# Patient Record
Sex: Female | Born: 1950 | Race: White | Hispanic: No | Marital: Married | Smoking: Never smoker
Health system: Southern US, Community
[De-identification: ages and names within clinical notes are randomized; demographics above are authoritative.]

## PROBLEM LIST (undated history)

## (undated) DIAGNOSIS — I427 Cardiomyopathy due to drug and external agent: Secondary | ICD-10-CM

## (undated) DIAGNOSIS — M359 Systemic involvement of connective tissue, unspecified: Secondary | ICD-10-CM

## (undated) DIAGNOSIS — Z780 Asymptomatic menopausal state: Secondary | ICD-10-CM

## (undated) DIAGNOSIS — K5730 Diverticulosis of large intestine without perforation or abscess without bleeding: Secondary | ICD-10-CM

## (undated) DIAGNOSIS — C50919 Malignant neoplasm of unspecified site of unspecified female breast: Secondary | ICD-10-CM

## (undated) DIAGNOSIS — M069 Rheumatoid arthritis, unspecified: Secondary | ICD-10-CM

## (undated) DIAGNOSIS — I5022 Chronic systolic (congestive) heart failure: Secondary | ICD-10-CM

## (undated) DIAGNOSIS — T451X5A Adverse effect of antineoplastic and immunosuppressive drugs, initial encounter: Secondary | ICD-10-CM

## (undated) HISTORY — DX: Asymptomatic menopausal state: Z78.0

## (undated) HISTORY — DX: Diverticulosis of large intestine without perforation or abscess without bleeding: K57.30

## (undated) HISTORY — PX: ANKLE FUSION: SHX881

## (undated) HISTORY — DX: Rheumatoid arthritis, unspecified: M06.9

## (undated) HISTORY — DX: Malignant neoplasm of unspecified site of unspecified female breast: C50.919

## (undated) HISTORY — DX: Chronic systolic (congestive) heart failure: I50.22

## (undated) HISTORY — DX: Adverse effect of antineoplastic and immunosuppressive drugs, initial encounter: T45.1X5A

## (undated) HISTORY — DX: Adverse effect of antineoplastic and immunosuppressive drugs, initial encounter: I42.7

## (undated) HISTORY — PX: OTHER SURGICAL HISTORY: SHX169

---

## 1995-08-31 DIAGNOSIS — C50919 Malignant neoplasm of unspecified site of unspecified female breast: Secondary | ICD-10-CM

## 1995-08-31 HISTORY — DX: Malignant neoplasm of unspecified site of unspecified female breast: C50.919

## 1995-08-31 HISTORY — PX: MASTECTOMY: SHX3

## 1995-08-31 HISTORY — PX: BREAST BIOPSY: SHX20

## 1995-08-31 HISTORY — PX: BREAST SURGERY: SHX581

## 2002-08-30 HISTORY — PX: FOOT SURGERY: SHX648

## 2004-07-31 ENCOUNTER — Ambulatory Visit: Payer: Self-pay | Admitting: Oncology

## 2004-08-03 ENCOUNTER — Ambulatory Visit: Payer: Self-pay | Admitting: Oncology

## 2004-08-30 ENCOUNTER — Ambulatory Visit: Payer: Self-pay | Admitting: Oncology

## 2005-07-14 ENCOUNTER — Ambulatory Visit: Payer: Self-pay | Admitting: Oncology

## 2005-07-15 ENCOUNTER — Ambulatory Visit: Payer: Self-pay | Admitting: Oncology

## 2005-08-02 ENCOUNTER — Ambulatory Visit: Payer: Self-pay | Admitting: Oncology

## 2005-11-12 ENCOUNTER — Ambulatory Visit: Payer: Self-pay | Admitting: Gastroenterology

## 2006-07-18 ENCOUNTER — Ambulatory Visit: Payer: Self-pay | Admitting: Oncology

## 2006-07-24 ENCOUNTER — Other Ambulatory Visit: Payer: Self-pay

## 2006-07-24 ENCOUNTER — Emergency Department: Payer: Self-pay | Admitting: General Practice

## 2006-07-27 ENCOUNTER — Ambulatory Visit: Payer: Self-pay | Admitting: Oncology

## 2006-07-28 ENCOUNTER — Ambulatory Visit: Payer: Self-pay | Admitting: Oncology

## 2006-08-04 ENCOUNTER — Ambulatory Visit: Payer: Self-pay | Admitting: Oncology

## 2006-08-30 ENCOUNTER — Ambulatory Visit: Payer: Self-pay | Admitting: Oncology

## 2007-01-16 ENCOUNTER — Ambulatory Visit: Payer: Self-pay | Admitting: Oncology

## 2007-07-31 ENCOUNTER — Ambulatory Visit: Payer: Self-pay | Admitting: Oncology

## 2007-08-10 ENCOUNTER — Ambulatory Visit: Payer: Self-pay | Admitting: Oncology

## 2007-08-31 ENCOUNTER — Ambulatory Visit: Payer: Self-pay | Admitting: Oncology

## 2007-10-29 ENCOUNTER — Ambulatory Visit: Payer: Self-pay | Admitting: Oncology

## 2007-11-29 ENCOUNTER — Ambulatory Visit: Payer: Self-pay | Admitting: Oncology

## 2008-01-17 ENCOUNTER — Ambulatory Visit: Payer: Self-pay | Admitting: Oncology

## 2008-01-23 ENCOUNTER — Ambulatory Visit: Payer: Self-pay | Admitting: Oncology

## 2008-07-30 ENCOUNTER — Ambulatory Visit: Payer: Self-pay | Admitting: Oncology

## 2008-08-09 ENCOUNTER — Ambulatory Visit: Payer: Self-pay | Admitting: Oncology

## 2008-08-30 ENCOUNTER — Ambulatory Visit: Payer: Self-pay | Admitting: Oncology

## 2008-08-30 HISTORY — PX: CARDIAC CATHETERIZATION: SHX172

## 2009-01-17 ENCOUNTER — Ambulatory Visit: Payer: Self-pay | Admitting: Oncology

## 2009-03-25 ENCOUNTER — Inpatient Hospital Stay: Payer: Self-pay | Admitting: Specialist

## 2009-07-30 ENCOUNTER — Ambulatory Visit: Payer: Self-pay | Admitting: Oncology

## 2009-08-08 ENCOUNTER — Ambulatory Visit: Payer: Self-pay | Admitting: Oncology

## 2009-08-30 ENCOUNTER — Ambulatory Visit: Payer: Self-pay | Admitting: Oncology

## 2009-08-30 LAB — HM COLONOSCOPY: HM Colonoscopy: NORMAL

## 2009-08-30 LAB — HM PAP SMEAR

## 2009-09-12 ENCOUNTER — Ambulatory Visit: Payer: Self-pay | Admitting: Cardiovascular Disease

## 2009-09-25 ENCOUNTER — Encounter: Payer: Self-pay | Admitting: Cardiovascular Disease

## 2009-11-14 DIAGNOSIS — I428 Other cardiomyopathies: Secondary | ICD-10-CM | POA: Insufficient documentation

## 2009-12-12 ENCOUNTER — Other Ambulatory Visit: Payer: Self-pay | Admitting: Rheumatology

## 2010-01-12 ENCOUNTER — Ambulatory Visit: Payer: Self-pay | Admitting: Cardiovascular Disease

## 2010-01-21 ENCOUNTER — Ambulatory Visit: Payer: Self-pay | Admitting: Internal Medicine

## 2010-02-04 ENCOUNTER — Encounter (INDEPENDENT_AMBULATORY_CARE_PROVIDER_SITE_OTHER): Payer: Self-pay | Admitting: *Deleted

## 2010-02-04 ENCOUNTER — Other Ambulatory Visit: Payer: Self-pay | Admitting: Internal Medicine

## 2010-02-04 LAB — CONVERTED CEMR LAB
Cholesterol: 157 mg/dL
HDL: 63 mg/dL
Hgb A1c MFr Bld: 5.2 %
LDL Cholesterol: 78 mg/dL
Triglyceride fasting, serum: 82 mg/dL

## 2010-02-11 ENCOUNTER — Encounter (INDEPENDENT_AMBULATORY_CARE_PROVIDER_SITE_OTHER): Payer: Self-pay | Admitting: *Deleted

## 2010-04-30 LAB — HM MAMMOGRAPHY: HM Mammogram: NORMAL

## 2010-05-13 ENCOUNTER — Encounter: Payer: Self-pay | Admitting: Cardiovascular Disease

## 2010-05-20 ENCOUNTER — Other Ambulatory Visit: Payer: Self-pay | Admitting: Rheumatology

## 2010-06-19 ENCOUNTER — Encounter: Payer: Self-pay | Admitting: Cardiovascular Disease

## 2010-07-15 ENCOUNTER — Ambulatory Visit: Payer: Self-pay | Admitting: Cardiovascular Disease

## 2010-07-15 DIAGNOSIS — R609 Edema, unspecified: Secondary | ICD-10-CM | POA: Insufficient documentation

## 2010-08-07 LAB — HM PAP SMEAR: HM Pap smear: NORMAL

## 2010-09-17 ENCOUNTER — Other Ambulatory Visit: Payer: Self-pay | Admitting: Rheumatology

## 2010-09-30 ENCOUNTER — Other Ambulatory Visit: Payer: Self-pay | Admitting: Rheumatology

## 2010-09-30 NOTE — Letter (Signed)
Summary: Medical Record Release  Medical Record Release   Imported By: Harlon Flor 11/21/2009 08:38:23  _____________________________________________________________________  External Attachment:    Type:   Image     Comment:   External Document

## 2010-09-30 NOTE — Assessment & Plan Note (Signed)
Summary: F6M/AMD   Visit Type:  Follow-up Primary Provider:  Duncan Dull, MD  CC:  c/o a twinge in chest at times..  History of Present Illness: Helen Parks is a very pleasant 60 her old woman with a history of nonischemic cardiomyopathy, normal coronary arteries by catheterization in 2010, history of rheumatoid arthritis, breast cancer, status post mastectomy and chemotherapy in 1997 with no radiation therapy with ejection fraction noted to be 30% in 2005. MUGA scan in 1997 was normal, mild chronic venous insufficiency. She presents for routine follow up.  Her initial ejection fraction was 25-30% and most recent echocardiogram done shows ejection fraction of 35-40%. She has mild to moderate mitral regurgitation, no pulmonary hypertension.  overall, she reports that she is doing very well. She is not participate in an exercise program. 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 She denies any shortness of breath. She has rare lower extremity edema with a short episode in the summer. Currently with no edema and no cough, lightheadedness or dizziness.  Her recent blood work from October 2011 shows total cholesterol 148, LDL 71, HDL 61 (on no medications), TSH 1.69, creatinine 0.68, hemoglobin A1c 5.2  EKG shows normal sinus rhythm with rate 77 beats per minute, no significant ST or T wave changes. Poor R-wave progression through the precordial leads  Current Medications (verified): 1)  Lisinopril 10 Mg Tabs (Lisinopril) .... Take One Tablet By Mouth Daily 2)  Methotrexate 2.5 Mg Tabs (Methotrexate Sodium) .... Take 8 Tablets On Thursday 3)  Plaquenil 200 Mg Tabs (Hydroxychloroquine Sulfate) .... Take 1 By Mouth Two Times A Day 4)  Sulfasalazine 500 Mg Tabs (Sulfasalazine) .... Take 1 By Mouth Two Times A Day 5)  Coreg 6.25 Mg Tabs (Carvedilol) .... Take 1 By Mouth Two Times A Day 6)  Prednisone 5 Mg Tabs (Prednisone) .... Take 1 By Mouth As Needed 7)  Calcium 150 Mg Tabs (Calcium  Carbonate) .... Take 1 By Mouth Once Daily 8)  Vitamin E 400 Unit Caps (Vitamin E) .... Take 1 By Mouth Once Daily 9)  Folic Acid 1 Mg Tabs (Folic Acid) .... Take 1 By Mouth Once Daily 10)  Quercitin 250 Mg .... Take 1 By Mouth Once Daily  Allergies (verified): 1)  ! Amoxicillin  Past History:  Past Medical History: Last updated: 11/14/2009 Cardiomyopathy Cardiac cath 2010 : normal coronary arteries rheumatology history hx of breast cancer  Past Surgical History: Last updated: 11/14/2009 Mastectomy  Social History: Last updated: 11/14/2009 Full Time Married  Tobacco Use - No.  Alcohol Use - no Drug Use - no  Risk Factors: Smoking Status: never (11/14/2009)  Review of Systems  The patient denies fever, weight loss, weight gain, vision loss, decreased hearing, hoarseness, chest pain, syncope, dyspnea on exertion, peripheral edema, prolonged cough, abdominal pain, incontinence, muscle weakness, depression, and enlarged lymph nodes.    Vital Signs:  Patient profile:   60 year old female Height:      64 inches Weight:      142 pounds BMI:     24.46 Pulse rate:   80 / minute BP sitting:   102 / 58  (left arm) Cuff size:   regular  Vitals Entered By: Bishop Dublin, CMA (July 15, 2010 2:21 PM)  Physical Exam  General:  well-appearing woman in no apparent distress, HEENT exam is benign, neck is supple with no JVP or carotid bruits heart sounds are regular with S1-S2 and no murmurs appreciated, lungs are clear to  auscultation with no wheezes or Rales, abdominal exam is benign, no significant lower extremity edema, neurologic exam is grossly nonfocal skin is warm and dry. Pulses are equal and symmetrical in her upper and lower extremities. Skin:  Intact without lesions or rashes. Psych:  Normal affect.   Impression & Recommendations:  Problem # 1:  CARDIOMYOPATHY, PRIMARY, DILATED (ICD-425.4) Ms. Wisby has been doing well on her current medication regimen with no  signs of decompensating heart failure. Ejection fraction had improved with medical management and she is relatively asymptomatic.  We have not made any medication changes at this time as she is doing well. We have encouraged her to start an exercise program.  Her updated medication list for this problem includes:    Lisinopril 10 Mg Tabs (Lisinopril) .Marland Kitchen... Take one tablet by mouth daily    Coreg 6.25 Mg Tabs (Carvedilol) .Marland Kitchen... Take 1 by mouth two times a day  Orders: EKG w/ Interpretation (93000)  Problem # 2:  EDEMA (ICD-782.3) Her mild edema is likely secondary to chronic venous insufficiency. She reports that her mother also had this. He recommended TED hose and leg elevation. If symptoms get worse, she could talk to vascular/vein surgery about the vein mapping.

## 2010-09-30 NOTE — Miscellaneous (Signed)
Summary: labs - 02/04/10  Clinical Lists Changes  Observations: Added new observation of HGBA1C: 5.2 % (02/04/2010 9:23) Added new observation of TRIGLYCERIDE: 82 mg/dL (98/06/9146 8:29) Added new observation of HDL: 63 mg/dL (56/21/3086 5:78) Added new observation of LDL: 78 mg/dL (46/96/2952 8:41) Added new observation of CHOLESTEROL: 157 mg/dL (32/44/0102 7:25)      -  Date:  02/04/2010    Cholesterol: 157    LDL: 78    HDL: 63    Triglycerides: 82    HgbA1c: 5.2

## 2010-09-30 NOTE — Assessment & Plan Note (Signed)
Summary: NP6/AMD   Primary Provider:  Duncan Dull, MD  CC:  ROV; Edema.  History of Present Illness: [name] is a very pleasant 60 her old woman with a history of nonischemic cardiomyopathy, normal when her arteries by catheterization in 2010, history of rheumatoid arthritis, breast cancer, status post mastectomy and chemotherapy in 1997 with no radiation therapy with ejection fraction noted to be 30% in 2005. MUGA scan in 1997 was normal. She presents to establish care.  Ms. Mirabal states that overall she's been doing well. She has been managed medically by Dr.Arida at [facility]. Her initial ejection fraction was 25-30% and most recent echocardiogram done shows ejection fraction of 35-40%. She has mild to moderate mitral regurgitation, no pulmonary hypertension. Initially she did have fatigue when cord was started though this has improved and she now is relatively asymptomatic. She works at [employer]. She's not do any exercise program.  Problems Prior to Update: 1)  Cardiomyopathy, Primary, Dilated  (ICD-425.4)  Medications Prior to Update: 1)  None  Current Medications (verified): 1)  Lisinopril 10 Mg Tabs (Lisinopril) .... Take One Tablet By Mouth Daily 2)  Methotrexate 2.5 Mg Tabs (Methotrexate Sodium) .... Take 8 Tablets On Thursday 3)  Plaquenil 200 Mg Tabs (Hydroxychloroquine Sulfate) .... Take 1 By Mouth Two Times A Day 4)  Sulfasalazine 500 Mg Tabs (Sulfasalazine) .... Take 1 By Mouth Two Times A Day 5)  Coreg 6.25 Mg Tabs (Carvedilol) .... Take 1 By Mouth Two Times A Day 6)  Prednisone 5 Mg Tabs (Prednisone) .... Take 1 By Mouth As Needed 7)  Calcium 150 Mg Tabs (Calcium Carbonate) .... Take 1 By Mouth Once Daily 8)  Vitamin E 400 Unit Caps (Vitamin E) .... Take 1 By Mouth Once Daily 9)  Folic Acid 1 Mg Tabs (Folic Acid) .... Take 1 By Mouth Once Daily 10)  Quercitin 250 Mg .... Take 1 By Mouth Once Daily  Allergies (verified): 1)  ! Amoxicillin  Past History:  Past  Medical History: Last updated: 11/14/2009 Cardiomyopathy Cardiac cath 2010 : normal coronary arteries rheumatology history hx of breast cancer  Past Surgical History: Last updated: 11/14/2009 Mastectomy  Social History: Last updated: 11/14/2009 Full Time Married  Tobacco Use - No.  Alcohol Use - no Drug Use - no  Risk Factors: Smoking Status: never (11/14/2009)  Review of Systems  The patient denies fever, weight loss, weight gain, vision loss, decreased hearing, hoarseness, chest pain, syncope, dyspnea on exertion, peripheral edema, prolonged cough, abdominal pain, incontinence, muscle weakness, depression, and enlarged lymph nodes.    Vital Signs:  Patient profile:   60 year old female Height:      64 inches Weight:      138.75 pounds BMI:     23.90 Pulse rate:   72 / minute BP sitting:   106 / 64  (left arm) Cuff size:   regular  Vitals Entered By: Stanton Kidney, EMT-P (Jan 12, 2010 3:51 PM)  Physical Exam  General:  well-appearing woman in no apparent distress, HEENT exam is benign, or fax is clear, neck is supple with no JVP or carotid bruits heart sounds are regular with S1-S2 and no murmurs appreciated, lungs are clear to auscultation with no wheezes Rales, abdominal exam is benign, no significant lower extremity edema, neurologic exam is grossly nonfocal skin is warm and dry. Pulses are equal and symmetrical in her upper and lower extremities.    EKG  Procedure date:  01/12/2010  Findings:  normal sinus rhythm with rate 72 beats per minute, no significant ST or T wave changes, poor R wave progression through the precordial leads  Impression & Recommendations:  Problem # 1:  CARDIOMYOPATHY, PRIMARY, DILATED (ICD-425.4) history of nonischemic cardiomyopathy. We'll try to obtain the most recent echocardiogram for our records. Ejection fraction 35-40% which is up from 25-30% 6 years ago. Mild to moderate mitral regurgitation. She is relatively asymptomatic  we've made no medication changes at this time. We'll continue to follow up with her twice here and asked her to contact us if she has worsening edema or shortness of breath.  Her updated medication list for this problem includes:    Lisinopril 10 Mg Tabs (Lisinopril) .Marland Kitchen... Take one tablet by mouth daily    Coreg 6.25 Mg Tabs (Carvedilol) .Marland Kitchen... Take 1 by mouth two times a day  Patient Instructions: 1)  Your physician recommends that you schedule a follow-up appointment in: 6 months Prescriptions: COREG 6.25 MG TABS (CARVEDILOL) Take 1 by mouth two times a day  #180 x 3   Entered by:   Cloyde Reams RN   Authorized by:   Dossie Arbour MD   Signed by:   Cloyde Reams RN on 01/12/2010   Method used:   Print then Give to Patient   RxID:   [id number] LISINOPRIL 10 MG TABS (LISINOPRIL) Take one tablet by mouth daily  #90 x 3   Entered by:   Cloyde Reams RN   Authorized by:   Dossie Arbour MD   Signed by:   Cloyde Reams RN on 01/12/2010   Method used:   Print then Give to Patient   RxID:   [id number]

## 2010-09-30 NOTE — Letter (Signed)
Summary: [facility] Office Visit Note   [facility] Visit Note   Imported By: Roderic Ovens 06/05/2010 13:42:06  _____________________________________________________________________  External Attachment:    Type:   Image     Comment:   External Document

## 2010-11-24 ENCOUNTER — Ambulatory Visit: Payer: Self-pay | Admitting: Rheumatology

## 2010-11-29 DIAGNOSIS — K5730 Diverticulosis of large intestine without perforation or abscess without bleeding: Secondary | ICD-10-CM

## 2010-11-29 HISTORY — DX: Diverticulosis of large intestine without perforation or abscess without bleeding: K57.30

## 2010-12-14 LAB — HM COLONOSCOPY

## 2010-12-16 ENCOUNTER — Other Ambulatory Visit: Payer: Self-pay | Admitting: Rheumatology

## 2010-12-18 ENCOUNTER — Ambulatory Visit: Payer: Self-pay | Admitting: Gastroenterology

## 2010-12-29 ENCOUNTER — Inpatient Hospital Stay: Payer: Self-pay | Admitting: Internal Medicine

## 2011-01-12 LAB — HM COLONOSCOPY

## 2011-01-13 ENCOUNTER — Ambulatory Visit: Payer: Self-pay | Admitting: Internal Medicine

## 2011-01-27 ENCOUNTER — Other Ambulatory Visit: Payer: Self-pay | Admitting: Emergency Medicine

## 2011-01-27 MED ORDER — LISINOPRIL 10 MG PO TABS: 10.0000 mg | ORAL_TABLET | Freq: Every day | ORAL | Status: DC

## 2011-02-01 ENCOUNTER — Ambulatory Visit: Payer: Self-pay | Admitting: Internal Medicine

## 2011-03-14 LAB — HM COLONOSCOPY

## 2011-03-17 ENCOUNTER — Telehealth: Payer: Self-pay

## 2011-03-17 MED ORDER — CARVEDILOL 6.25 MG PO TABS: 6.2500 mg | ORAL_TABLET | Freq: Two times a day (BID) | ORAL | Status: DC

## 2011-03-17 NOTE — Telephone Encounter (Signed)
Needs a refill for Coreg 6.25 mg take one tablet twice a day.

## 2011-03-18 ENCOUNTER — Ambulatory Visit: Payer: Self-pay | Admitting: Internal Medicine

## 2011-03-24 ENCOUNTER — Encounter: Payer: Self-pay | Admitting: Cardiovascular Disease

## 2011-04-28 ENCOUNTER — Other Ambulatory Visit: Payer: Self-pay | Admitting: Internal Medicine

## 2011-04-28 DIAGNOSIS — Z23 Encounter for immunization: Secondary | ICD-10-CM

## 2011-04-28 MED ORDER — ZOSTER VACCINE LIVE 19400 UNT/0.65ML ~~LOC~~ SOLR: 0.6500 mL | Freq: Once | SUBCUTANEOUS | Status: DC

## 2011-05-14 ENCOUNTER — Encounter: Payer: Self-pay | Admitting: Rheumatology

## 2011-05-31 ENCOUNTER — Encounter: Payer: Self-pay | Admitting: Rheumatology

## 2011-07-07 ENCOUNTER — Encounter: Payer: Self-pay | Admitting: Internal Medicine

## 2011-09-14 ENCOUNTER — Encounter: Payer: Self-pay | Admitting: Internal Medicine

## 2011-09-14 ENCOUNTER — Ambulatory Visit (INDEPENDENT_AMBULATORY_CARE_PROVIDER_SITE_OTHER): Payer: PRIVATE HEALTH INSURANCE | Admitting: Internal Medicine

## 2011-09-14 DIAGNOSIS — Z853 Personal history of malignant neoplasm of breast: Secondary | ICD-10-CM | POA: Insufficient documentation

## 2011-09-14 DIAGNOSIS — K5730 Diverticulosis of large intestine without perforation or abscess without bleeding: Secondary | ICD-10-CM | POA: Insufficient documentation

## 2011-09-14 DIAGNOSIS — Z780 Asymptomatic menopausal state: Secondary | ICD-10-CM | POA: Insufficient documentation

## 2011-09-14 DIAGNOSIS — I428 Other cardiomyopathies: Secondary | ICD-10-CM

## 2011-09-14 DIAGNOSIS — M069 Rheumatoid arthritis, unspecified: Secondary | ICD-10-CM | POA: Insufficient documentation

## 2011-09-14 DIAGNOSIS — Z124 Encounter for screening for malignant neoplasm of cervix: Secondary | ICD-10-CM

## 2011-09-14 DIAGNOSIS — Z1211 Encounter for screening for malignant neoplasm of colon: Secondary | ICD-10-CM

## 2011-09-14 MED ORDER — FUROSEMIDE 20 MG PO TABS: 20.0000 mg | ORAL_TABLET | Freq: Every day | ORAL | Status: DC

## 2011-09-14 MED ORDER — POTASSIUM CHLORIDE CRYS ER 20 MEQ PO TBCR: 20.0000 meq | EXTENDED_RELEASE_TABLET | Freq: Every day | ORAL | Status: DC

## 2011-09-14 NOTE — Patient Instructions (Signed)
I have called in fuosemide and potassium chloride to take together when needed for fluid retention.  Please fax me any labs you receive form Dr. Jerline Pain.  We will call with your PAP results

## 2011-09-15 ENCOUNTER — Other Ambulatory Visit (HOSPITAL_COMMUNITY)
Admission: RE | Admit: 2011-09-15 | Discharge: 2011-09-15 | Disposition: A | Discharge status: Home / Self Care | Payer: PRIVATE HEALTH INSURANCE | Source: Ambulatory Visit | Attending: Internal Medicine | Admitting: Internal Medicine

## 2011-09-15 ENCOUNTER — Encounter: Payer: Self-pay | Admitting: Internal Medicine

## 2011-09-15 DIAGNOSIS — Z1159 Encounter for screening for other viral diseases: Secondary | ICD-10-CM | POA: Insufficient documentation

## 2011-09-15 DIAGNOSIS — Z1211 Encounter for screening for malignant neoplasm of colon: Secondary | ICD-10-CM | POA: Insufficient documentation

## 2011-09-15 DIAGNOSIS — Z01419 Encounter for gynecological examination (general) (routine) without abnormal findings: Secondary | ICD-10-CM | POA: Insufficient documentation

## 2011-09-15 DIAGNOSIS — Z124 Encounter for screening for malignant neoplasm of cervix: Secondary | ICD-10-CM | POA: Insufficient documentation

## 2011-09-15 NOTE — Assessment & Plan Note (Signed)
secondary to Enbrel.  Now resolved, normalized EF by repeat ECHO.  Prn furosemide for occasional edema

## 2011-09-15 NOTE — Assessment & Plan Note (Signed)
Colonoscopy was done by [facility] April 2012,  Diverticulosis only.

## 2011-09-15 NOTE — Assessment & Plan Note (Signed)
PAP was normal 2010 and repeated today

## 2011-09-15 NOTE — Progress Notes (Signed)
  Subjective:     Helen Parks is a 61 y.o. female and is here for a comprehensive physical exam. The patient reports no problems.  History   Social History  . Marital Status: Single    Spouse Name: N/A    Number of Children: N/A  . Years of Education: N/A   Occupational History  . dental hygienist     full time   Social History Main Topics  . Smoking status: Former Games developer  . Smokeless tobacco: Not on file   Comment: tobacco use- no  . Alcohol Use: No  . Drug Use: No  . Sexually Active: Not on file   Other Topics Concern  . Not on file   Social History Narrative   Lives with spouse.   Health Maintenance  Topic Date Due  . Tetanus/tdap  03/14/1972  . Influenza Vaccine  05/31/2011  . Mammogram  04/30/2012  . Pap Smear  08/30/2012  . Colonoscopy  08/31/2019    The following portions of the patient's history were reviewed and updated as appropriate: allergies, current medications, past family history, past medical history, past social history, past surgical history and problem list.  Review of Systems A comprehensive review of systems was negative.   Objective:  BP 118/78  Pulse 90  Temp(Src) 98.7 F (37.1 C) (Oral)  Resp 12  Ht 5\' 4"  (1.626 m)  Wt 146 lb (66.225 kg)  BMI 25.06 kg/m2  SpO2 98%  General Appearance:  Alert, cooperative, no distress, appears stated age  Head:  Normocephalic, without obvious abnormality, atraumatic  Eyes:  PERRL, conjunctiva/corneas clear, EOM's intact, fundi benign, both eyes  Ears:  Normal TM's and external ear canals, both ears  Nose: Nares normal, septum midline,mucosa normal, no drainage or sinus tenderness  Throat: Lips, mucosa, and tongue normal; teeth and gums normal  Neck: Supple, symmetrical, trachea midline, no adenopathy;  thyroid: not enlarged, symmetric, no tenderness/mass/nodules; no carotid bruit or JVD  Back:   Symmetric, no curvature, ROM normal, no CVA tenderness  Lungs:   Clear to auscultation bilaterally,  respirations unlabored  Breasts:  Right mastectomy.  No masses or tenderness  Heart:  Regular rate and rhythm, S1 and S2 normal, no murmur, rub, or gallop  Abdomen:   Soft, non-tender, bowel sounds active all four quadrants,  no masses, no organomegaly  Pelvic: Deferred  Extremities: Extremities normal, atraumatic, no cyanosis or edema  Pulses: 2+ and symmetric  Skin: Skin color, texture, turgor normal, no rashes or lesions  Lymph nodes: Cervical, supraclavicular, and axillary nodes normal  Neurologic: Normal         Assessment:    Healthy female exam. Recent labs from Dr. Gavin Potters requested.  Plan:     See After Visit Summary for Counseling Recommendations

## 2011-09-15 NOTE — Progress Notes (Signed)
Addended by: Melody Comas L on: 09/15/2011 10:20 AM   Modules accepted: Orders

## 2011-09-21 ENCOUNTER — Other Ambulatory Visit: Payer: Self-pay | Admitting: *Deleted

## 2011-09-21 MED ORDER — FUROSEMIDE 20 MG PO TABS: 20.0000 mg | ORAL_TABLET | Freq: Every day | ORAL | Status: DC

## 2011-09-21 MED ORDER — POTASSIUM CHLORIDE CRYS ER 20 MEQ PO TBCR: 20.0000 meq | EXTENDED_RELEASE_TABLET | Freq: Every day | ORAL | Status: DC

## 2011-09-21 NOTE — Telephone Encounter (Signed)
Opened in error

## 2011-09-23 ENCOUNTER — Encounter: Payer: Self-pay | Admitting: *Deleted

## 2011-09-24 ENCOUNTER — Other Ambulatory Visit: Payer: Self-pay | Admitting: Gastroenterology

## 2011-09-24 ENCOUNTER — Encounter: Payer: Self-pay | Admitting: Internal Medicine

## 2011-10-05 ENCOUNTER — Encounter: Payer: Self-pay | Admitting: Internal Medicine

## 2011-10-19 ENCOUNTER — Other Ambulatory Visit: Payer: Self-pay | Admitting: Rheumatology

## 2011-10-19 LAB — CREATININE, SERUM
Creatinine: 0.67 mg/dL (ref 0.60–1.30)
EGFR (African American): >60
EGFR (Non-African Amer.): >60

## 2011-10-19 LAB — CBC WITH DIFFERENTIAL/PLATELET
Basophil #: 0 10*3/uL (ref 0.0–0.1)
Basophil %: 0.5 %
Eosinophil #: 0 10*3/uL (ref 0.0–0.7)
Eosinophil %: 0.9 %
HCT: 37.7 % (ref 35.0–47.0)
HGB: 12.6 g/dL (ref 12.0–16.0)
Lymphocyte #: 1.4 10*3/uL (ref 1.0–3.6)
Lymphocyte %: 27.1 %
MCH: 32.9 pg (ref 26.0–34.0)
MCHC: 33.3 g/dL (ref 32.0–36.0)
MCV: 99 fL (ref 80–100)
Monocyte #: 0.4 10*3/uL (ref 0.0–0.7)
Monocyte %: 8.6 %
Neutrophil #: 3.2 10*3/uL (ref 1.4–6.5)
Neutrophil %: 62.9 %
Platelet: 201 10*3/uL (ref 150–440)
RBC: 3.82 10*6/uL (ref 3.80–5.20)
RDW: 13.6 % (ref 11.5–14.5)
WBC: 5.1 10*3/uL (ref 3.6–11.0)

## 2011-10-19 LAB — SGOT (AST)(ARMC): SGOT(AST): 24 U/L (ref 15–37)

## 2011-10-19 LAB — ALT: SGPT (ALT): 27 U/L

## 2011-10-19 LAB — ALBUMIN: Albumin: 3.9 g/dL (ref 3.4–5.0)

## 2011-10-19 LAB — SEDIMENTATION RATE: Erythrocyte Sed Rate: 11 mm/hr (ref 0–30)

## 2011-10-29 ENCOUNTER — Other Ambulatory Visit: Payer: Self-pay | Admitting: Rheumatology

## 2011-11-10 ENCOUNTER — Other Ambulatory Visit: Payer: Self-pay | Admitting: *Deleted

## 2011-11-10 MED ORDER — CARVEDILOL 6.25 MG PO TABS: 6.2500 mg | ORAL_TABLET | Freq: Two times a day (BID) | ORAL | Status: DC

## 2011-11-16 ENCOUNTER — Other Ambulatory Visit: Payer: Self-pay | Admitting: Cardiovascular Disease

## 2011-11-16 NOTE — Telephone Encounter (Signed)
Pharmacy states they have not received anything from [place] about these refills

## 2011-11-17 ENCOUNTER — Other Ambulatory Visit: Payer: Self-pay

## 2011-11-17 NOTE — Telephone Encounter (Signed)
LMOM to have patient call the office to schedule a follow up appointment. The patient is past due.

## 2011-11-18 MED ORDER — CARVEDILOL 6.25 MG PO TABS: 6.2500 mg | ORAL_TABLET | Freq: Two times a day (BID) | ORAL | Status: DC

## 2011-11-18 MED ORDER — LISINOPRIL 10 MG PO TABS: 10.0000 mg | ORAL_TABLET | Freq: Every day | ORAL | Status: DC

## 2011-11-18 NOTE — Telephone Encounter (Signed)
Notified patient need to make an appointment with Dr.Gollan she is past due. The patient made an appointment with Dr. Kirke Corin in April 2013. A refill was sent today for lisinopril and carvedilol.

## 2011-11-18 NOTE — Telephone Encounter (Signed)
Refill sent for lisinopril & carvedilol to [facility].

## 2011-12-03 ENCOUNTER — Ambulatory Visit (INDEPENDENT_AMBULATORY_CARE_PROVIDER_SITE_OTHER): Payer: PRIVATE HEALTH INSURANCE | Admitting: Cardiovascular Disease

## 2011-12-03 ENCOUNTER — Encounter: Payer: Self-pay | Admitting: Cardiovascular Disease

## 2011-12-03 VITALS — BP 107/70 | HR 85 | Ht 64.0 in | Wt 149.0 lb

## 2011-12-03 DIAGNOSIS — I428 Other cardiomyopathies: Secondary | ICD-10-CM

## 2011-12-03 NOTE — Patient Instructions (Addendum)
Your physician has requested that you have an echocardiogram DX CARDIOMYOPATHY, ECHO APPT 12/09/11 @ 12:30 PM BE THERE BY 12 PM @ [facility]. Echocardiography is a painless test that uses sound waves to create images of your heart. It provides your doctor with information about the size and shape of your heart and how well your heart's chambers and valves are working. This procedure takes approximately one hour. There are no restrictions for this procedure.  Your physician wants you to follow-up in: 6 MONTHS WITH DR. Kirke Corin. You will receive a reminder letter in the mail two months in advance. If you don't receive a letter, please call our office to schedule the follow-up appointment.

## 2011-12-03 NOTE — Progress Notes (Signed)
HPI  [name] is a very pleasant 85 old woman with a history of nonischemic cardiomyopathy, normal coronary arteries by catheterization in 2010, history of rheumatoid arthritis, breast cancer, status post mastectomy and chemotherapy in 1997 with no radiation therapy with ejection fraction noted to be 30% in 2005. MUGA scan in 1997 was normal.  She presents for routine follow up. Had cardiomyopathy was felt to be due to previous chemotherapy. She had one episode of heart failure in 2010 which was likely triggered by the use of Enbrel for rheumatoid arthritis. Her initial ejection fraction was 25-30% and most recent echocardiogram done shows ejection fraction of 35-40% but no recent evaluation. She has mild to moderate mitral regurgitation, no pulmonary hypertension.  overall, she reports that she is doing very well. She is not participate in an exercise program. 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 She denies any shortness of breath. Currently with no edema and no cough, lightheadedness or dizziness.   Allergies  Allergen Reactions  . Amoxicillin     REACTION: upset stomach     Current Outpatient Prescriptions on File Prior to Visit  Medication Sig Dispense Refill  . Calcium 150 MG TABS Take by mouth daily.        . carvedilol (COREG) 6.25 MG tablet Take 1 tablet (6.25 mg total) by mouth 2 (two) times daily.  60 tablet  3  . folic acid (FOLVITE) 1 MG tablet Take 1 mg by mouth daily.        . hydroxychloroquine (PLAQUENIL) 200 MG tablet Take by mouth 2 (two) times daily.        Marland Kitchen lisinopril (PRINIVIL,ZESTRIL) 10 MG tablet Take 1 tablet (10 mg total) by mouth daily.  30 tablet  6  . methotrexate (RHEUMATREX) 2.5 MG tablet Take 2.5 mg by mouth once a week. Take 8 tabs on thursday Caution:Chemotherapy. Protect from light.       . potassium chloride SA (K-DUR,KLOR-CON) 20 MEQ tablet Take 1 tablet (20 mEq total) by mouth daily.  30 tablet  3  . Quercetin (QUERCITIN) POWD 250 mg  by Does not apply route daily.        . vitamin E 400 UNIT capsule Take 400 Units by mouth daily.        Marland Kitchen zoster vaccine live, PF, (ZOSTAVAX) 40981 UNT/0.65ML injection Inject 19,400 Units into the skin once.  1 vial  0  . DISCONTD: furosemide (LASIX) 20 MG tablet Take 1 tablet (20 mg total) by mouth daily.  30 tablet  3     Past Medical History  Diagnosis Date  . Cardiomyopathy   . Diverticulosis of colon (without mention of hemorrhage) April 2012    occurred after colonoscopy, [place]  . Menopause     secondary to chemo for BRCA  . Rheumatoid arthritis     on Plaquenil methotrxate [place]avenia Atlas)  . Breast cancer 2010    s/p mastectomy, left breast   . Breast cancer 1997  . Cardiomyopathy due to chemotherapy     precipitated by use of Enbrel  2010     Past Surgical History  Procedure Date  . Rheumatology histroy   . Mastectomy   . Cholecystectomy 1992  . Leep     several years ago, normal paps since  . Foot surgery 1997  . 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 Cardiac catheterization 2010    normal coronary arteries     Family  History  Problem Relation Age of Onset  . Heart disease Father   . Cancer Paternal Aunt     ovarian  . Heart disease Maternal Grandmother      History   Social History  . Marital Status: Single    Spouse Name: N/A    Number of Children: N/A  . Years of Education: N/A   Occupational History  . dental hygienist     full time   Social History Main Topics  . Smoking status: Former Games developer  . Smokeless tobacco: Not on file   Comment: tobacco use- no  . Alcohol Use: No  . Drug Use: No  . Sexually Active: Not on file   Other Topics Concern  . Not on file   Social History Narrative   Lives with spouse.     PHYSICAL EXAM   BP 107/70  Pulse 85  Ht 5\' 4"  (1.626 m)  Wt 149 lb (67.586 kg)  BMI 25.58 kg/m2 Constitutional: She is oriented to person, place, and time. She appears well-developed and  well-nourished. No distress.  HENT: No nasal discharge.  Head: Normocephalic and atraumatic.  Eyes: Pupils are equal and round. Right eye exhibits no discharge. Left eye exhibits no discharge.  Neck: Normal range of motion. Neck supple. No JVD present. No thyromegaly present.  Cardiovascular: Normal rate, regular rhythm, normal heart sounds. Exam reveals no gallop and no friction rub. No murmur heard.  Pulmonary/Chest: Effort normal and breath sounds normal. No stridor. No respiratory distress. She has no wheezes. She has no rales. She exhibits no tenderness.  Abdominal: Soft. Bowel sounds are normal. She exhibits no distension. There is no tenderness. There is no rebound and no guarding.  Musculoskeletal: Normal range of motion. She exhibits no edema and no tenderness.  Neurological: She is alert and oriented to person, place, and time. Coordination normal.  Skin: Skin is warm and dry. No rash noted. She is not diaphoretic. No erythema. No pallor.  Psychiatric: She has a normal mood and affect. Her behavior is normal. Judgment and thought content normal.     EKG: Normal sinus rhythm with no significant ST or T wave changes. Normal QT interval.   ASSESSMENT AND PLAN

## 2011-12-03 NOTE — Assessment & Plan Note (Signed)
Helen Parks seems to be stable from a cardiac standpoint. She has been tolerating heart failure medications. She appears to be in [place] Heart Association class II. She does not appear to be fluid overloaded and rarely has to use furosemide. I recommend continuing current therapy. I recommend a followup echocardiogram to evaluate her LV systolic function. She is to followup in 6 months from now or earlier if needed.

## 2011-12-09 ENCOUNTER — Ambulatory Visit: Payer: Self-pay | Admitting: Cardiovascular Disease

## 2011-12-09 DIAGNOSIS — I059 Rheumatic mitral valve disease, unspecified: Secondary | ICD-10-CM

## 2011-12-21 ENCOUNTER — Telehealth: Payer: Self-pay

## 2011-12-21 DIAGNOSIS — R609 Edema, unspecified: Secondary | ICD-10-CM

## 2011-12-21 DIAGNOSIS — I428 Other cardiomyopathies: Secondary | ICD-10-CM

## 2011-12-21 MED ORDER — SPIRONOLACTONE 25 MG PO TABS: 25.0000 mg | ORAL_TABLET | Freq: Every day | ORAL | Status: DC

## 2011-12-21 NOTE — Telephone Encounter (Signed)
LMOM to have patient call regarding lab work and office visit with Dr. Kirke Corin in two months.

## 2011-12-21 NOTE — Telephone Encounter (Signed)
Message copied by Festus Aloe on Tue Dec 21, 2011 11:22 AM ------      Message from: Lorine Bears A      Created: Tue Dec 21, 2011 10:11 AM      Regarding: Echo results       I discussed echo results with patient at the hospital. Her EF is worse than before.       Start her on Aldactone 25 mg once daily # 30 with 6 refills.       Request BMP in 1 week and in 1 month (she wants done at [facility]).       Schedule her for follow up with me in 2 months.

## 2011-12-22 NOTE — Telephone Encounter (Signed)
Notified patient need to have lab work BMET in one week with a repeat in one month. The patient will also follow up with Dr. Kirke Corin on February 07, 2012.

## 2011-12-28 ENCOUNTER — Other Ambulatory Visit: Payer: Self-pay | Admitting: Cardiovascular Disease

## 2011-12-28 LAB — BASIC METABOLIC PANEL
Anion Gap: 6 — ABNORMAL LOW (ref 7–16)
BUN: 14 mg/dL (ref 7–18)
Calcium, Total: 8.7 mg/dL (ref 8.5–10.1)
Chloride: 103 mmol/L (ref 98–107)
Co2: 30 mmol/L (ref 21–32)
Creatinine: 0.57 mg/dL — ABNORMAL LOW (ref 0.60–1.30)
EGFR (African American): >60
EGFR (Non-African Amer.): >60
Glucose: 91 mg/dL (ref 65–99)
Osmolality: 278 (ref 275–301)
Potassium: 4.2 mmol/L (ref 3.5–5.1)
Sodium: 139 mmol/L (ref 136–145)

## 2012-01-14 ENCOUNTER — Emergency Department: Payer: Self-pay | Admitting: Internal Medicine

## 2012-01-14 LAB — COMPREHENSIVE METABOLIC PANEL
Albumin: 4 g/dL (ref 3.4–5.0)
Alkaline Phosphatase: 59 U/L (ref 50–136)
Anion Gap: 7 (ref 7–16)
BUN: 15 mg/dL (ref 7–18)
Bilirubin,Total: 0.5 mg/dL (ref 0.2–1.0)
Calcium, Total: 8.8 mg/dL (ref 8.5–10.1)
Chloride: 104 mmol/L (ref 98–107)
Co2: 27 mmol/L (ref 21–32)
Creatinine: 0.73 mg/dL (ref 0.60–1.30)
EGFR (African American): >60
EGFR (Non-African Amer.): >60
Glucose: 89 mg/dL (ref 65–99)
Osmolality: 276 (ref 275–301)
Potassium: 4 mmol/L (ref 3.5–5.1)
SGOT(AST): 32 U/L (ref 15–37)
SGPT (ALT): 23 U/L
Sodium: 138 mmol/L (ref 136–145)
Total Protein: 7.5 g/dL (ref 6.4–8.2)

## 2012-01-14 LAB — CK TOTAL AND CKMB (NOT AT ARMC)
CK, Total: 73 U/L (ref 21–215)
CK-MB: <0.5 ng/mL — ABNORMAL LOW (ref 0.5–3.6)

## 2012-01-14 LAB — CBC
HCT: 38 % (ref 35.0–47.0)
HGB: 13.1 g/dL (ref 12.0–16.0)
MCH: 33.3 pg (ref 26.0–34.0)
MCHC: 34.5 g/dL (ref 32.0–36.0)
MCV: 96 fL (ref 80–100)
Platelet: 195 10*3/uL (ref 150–440)
RBC: 3.95 10*6/uL (ref 3.80–5.20)
RDW: 13.5 % (ref 11.5–14.5)
WBC: 4.8 10*3/uL (ref 3.6–11.0)

## 2012-01-14 LAB — PRO B NATRIURETIC PEPTIDE: B-Type Natriuretic Peptide: 620 pg/mL — ABNORMAL HIGH (ref 0–125)

## 2012-01-14 LAB — TROPONIN I: Troponin-I: <0.02 ng/mL

## 2012-01-18 ENCOUNTER — Encounter: Payer: Self-pay | Admitting: Cardiovascular Disease

## 2012-01-18 ENCOUNTER — Ambulatory Visit (INDEPENDENT_AMBULATORY_CARE_PROVIDER_SITE_OTHER): Payer: PRIVATE HEALTH INSURANCE | Admitting: Cardiovascular Disease

## 2012-01-18 VITALS — BP 110/64 | HR 80 | Ht 64.0 in | Wt 146.0 lb

## 2012-01-18 DIAGNOSIS — I428 Other cardiomyopathies: Secondary | ICD-10-CM

## 2012-01-18 DIAGNOSIS — R0602 Shortness of breath: Secondary | ICD-10-CM

## 2012-01-18 DIAGNOSIS — I5022 Chronic systolic (congestive) heart failure: Secondary | ICD-10-CM | POA: Insufficient documentation

## 2012-01-18 DIAGNOSIS — I509 Heart failure, unspecified: Secondary | ICD-10-CM

## 2012-01-18 MED ORDER — SPIRONOLACTONE 25 MG PO TABS: 25.0000 mg | ORAL_TABLET | Freq: Every day | ORAL | Status: DC

## 2012-01-18 NOTE — Progress Notes (Signed)
HPI  Helen Parks is a very pleasant 31 old woman with a history of nonischemic cardiomyopathy, normal coronary arteries by catheterization in 2010, history of rheumatoid arthritis, breast cancer, status post mastectomy and chemotherapy in 1997 with no radiation therapy with ejection fraction noted to be 30% in 2005. MUGA scan in 1997 was normal. She presents for routine follow up. Her cardiomyopathy was felt to be due to previous chemotherapy. She had an episode of heart failure in 2010 which was likely triggered by the use of Enbrel for rheumatoid arthritis.  Her initial ejection fraction was 25-30% which improved to 35-40% in 2011after starting Coreg and Lisinopril. I saw her in April for a followup visit. An echocardiogram was performed which showed significant worsening in her left ventricular function. The left ventricle was more dilated with an ejection fraction of 25%. I started her on spironolactone 25 mg once daily. She presented last week to [facility] ER with significant worsening of dyspnea. Her labs were unremarkable except for a BNP of 600 and a slightly elevated d-dimer level. She had CT scan done which showed no evidence of pulmonary embolism but there was mild pulmonary vascular congestion. Her dose of Lasix was increased to 40 mg once daily. She felt better after that but she continues to feel not back to her baseline. She denies orthopnea or PND.  Allergies  Allergen Reactions  . Amoxicillin     REACTION: upset stomach     Current Outpatient Prescriptions on File Prior to Visit  Medication Sig Dispense Refill  . Calcium 150 MG TABS Take by mouth daily.        . carvedilol (COREG) 6.25 MG tablet Take 1 tablet (6.25 mg total) by mouth 2 (two) times daily.  60 tablet  3  . folic acid (FOLVITE) 1 MG tablet Take 1 mg by mouth daily.        Marland Kitchen lisinopril (PRINIVIL,ZESTRIL) 10 MG tablet Take 1 tablet (10 mg total) by mouth daily.  30 tablet  6  . methotrexate (RHEUMATREX) 2.5 MG tablet  Take 2.5 mg by mouth once a week. Take 10 tabs on thursday Caution:Chemotherapy. Protect from light.      . Quercetin (QUERCITIN) POWD 250 mg by Does not apply route daily.        . vitamin E 400 UNIT capsule Take 400 Units by mouth daily.        Marland Kitchen DISCONTD: spironolactone (ALDACTONE) 25 MG tablet Take 1 tablet (25 mg total) by mouth daily.  30 tablet  6     Past Medical History  Diagnosis Date  . Cardiomyopathy   . Diverticulosis of colon (without mention of hemorrhage) April 2012    occurred after colonoscopy, [place]  . Menopause     secondary to chemo for BRCA  . Rheumatoid arthritis     on Plaquenil methotrxate Lavenia Atlas)  . Breast cancer 2010    s/p mastectomy, left breast   . Breast cancer 1997  . Cardiomyopathy due to chemotherapy     precipitated by use of Enbrel  2010     Past Surgical History  Procedure Date  . Rheumatology histroy   . Mastectomy   . Cholecystectomy 1992  . Leep     several years ago, normal paps since  . Foot surgery 1997  . 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 Cardiac catheterization 2010    normal coronary arteries     Family History  Problem  Relation Age of Onset  . Heart disease Father   . Cancer Paternal Aunt     ovarian  . Heart disease Maternal Grandmother      History   Social History  . Marital Status: Single    Spouse Name: N/A    Number of Children: N/A  . Years of Education: N/A   Occupational History  . dental hygienist     full time   Social History Main Topics  . Smoking status: Former Games developer  . Smokeless tobacco: Not on file   Comment: tobacco use- no  . Alcohol Use: No  . Drug Use: No  . Sexually Active: Not on file   Other Topics Concern  . Not on file   Social History Narrative   Lives with spouse.      PHYSICAL EXAM   BP 110/64  Pulse 80  Ht 5\' 4"  (1.626 m)  Wt 146 lb (66.225 kg)  BMI 25.06 kg/m2 Constitutional: She is oriented to person, place, and time.  She appears well-developed and well-nourished. No distress.  HENT: No nasal discharge.  Head: Normocephalic and atraumatic.  Eyes: Pupils are equal and round. Right eye exhibits no discharge. Left eye exhibits no discharge.  Neck: Normal range of motion. Neck supple. No JVD present. No thyromegaly present.  Cardiovascular: Normal rate, regular rhythm, normal heart sounds. Exam reveals no gallop and no friction rub. No murmur heard.  Pulmonary/Chest: Effort normal and breath sounds normal. No stridor. No respiratory distress. She has no wheezes. She has no rales. She exhibits no tenderness.  Abdominal: Soft. Bowel sounds are normal. She exhibits no distension. There is no tenderness. There is no rebound and no guarding.  Musculoskeletal: Normal range of motion. She exhibits no edema and no tenderness.  Neurological: She is alert and oriented to person, place, and time. Coordination normal.  Skin: Skin is warm and dry. No rash noted. She is not diaphoretic. No erythema. No pallor.  Psychiatric: She has a normal mood and affect. Her behavior is normal. Judgment and thought content normal.     EKG: Sinus  Rhythm -Old anteroseptal infarct.   Low voltage -possible pulmonary disease.      ASSESSMENT AND PLAN

## 2012-01-18 NOTE — Patient Instructions (Addendum)
Labs today.  Cardiac MRI at [facility].  Consult with Dr. Graciela Husbands for ICD   Return to work next week on Tuesday.  Follow up in 2 months.

## 2012-01-18 NOTE — Assessment & Plan Note (Signed)
Helen Parks had a significant deterioration in her cardiac status recently. Her ejection fraction has decreased to 25% and clinically she has progressed from class II to class III [place] heart association. She also showed evidence of significant fluid overload requiring increased use of diuretics. The etiology for worsening cardiac function after improvement in 2011 is still not clear but there are a few things to consider. She does have prolonged history of rheumatoid arthritis and will have to evaluate the possibility of cardiac amyloidosis which is not a high possibility. The other possibility is hydroxychloroquine associated cardiomyopathy which is overall rate. This was the only new medication that was initiated in 2011. We have stopped this medication after the recent echocardiogram findings. I recommend proceeding with a cardiac MRI for further evaluation. I will check basic metabolic profile and BNP today due to her persistent dyspnea and recent increase in the dose of Lasix. I will schedule her to see Dr. Graciela Husbands for evaluation of an ICD placement. Her QRS duration is not prolonged enough to qualify for a CRT.

## 2012-01-19 ENCOUNTER — Telehealth: Payer: Self-pay

## 2012-01-19 LAB — BASIC METABOLIC PANEL
BUN/Creatinine Ratio: 22 (ref 9–23)
BUN: 14 mg/dL (ref 6–24)
CO2: 24 mmol/L (ref 20–32)
Calcium: 9.5 mg/dL (ref 8.7–10.2)
Chloride: 99 mmol/L (ref 97–108)
Creatinine, Ser: 0.64 mg/dL (ref 0.57–1.00)
GFR calc Af Amer: 114 mL/min/{1.73_m2} (ref >59)
GFR calc non Af Amer: 99 mL/min/{1.73_m2} (ref >59)
Glucose: 96 mg/dL (ref 65–99)
Potassium: 4.4 mmol/L (ref 3.5–5.2)
Sodium: 138 mmol/L (ref 134–144)

## 2012-01-19 LAB — BRAIN NATRIURETIC PEPTIDE: BNP: 91 pg/mL (ref 0.0–100.0)

## 2012-01-19 NOTE — Telephone Encounter (Signed)
Message copied by Marcelle Overlie on Wed Jan 19, 2012 11:10 AM ------      Message from: Lorne Skeens C      Created: Wed Jan 19, 2012 11:03 AM      Regarding: FW: cardiac MRI re do       Kamali Sakata, correct, thank you, Dr. Kirke Corin has to sign off before MRI can proceed. Send him an Chief Technology Officer. Thanks gesila                   ----- Message -----         From: Marcelle Overlie, RN         Sent: 01/19/2012  10:14 AM           To: Sherrilyn Rist      Subject: cardiac MRI re do                                        See if I did it right this time :)      Thanks!

## 2012-01-19 NOTE — Progress Notes (Signed)
Addended by: Sabino Snipes E on: 01/19/2012 10:13 AM   Modules accepted: Orders

## 2012-01-19 NOTE — Telephone Encounter (Signed)
I signed the order. I put the order myself yesterday. What was wrong with that?

## 2012-01-19 NOTE — Telephone Encounter (Signed)
Not sure. She just asked me to redo it. Sorry :(

## 2012-01-19 NOTE — Telephone Encounter (Signed)
Dr. Kirke Corin, please sign off on cardiac MRI order so this can be scheduled. Thanks!

## 2012-01-20 ENCOUNTER — Telehealth: Payer: Self-pay | Admitting: *Deleted

## 2012-01-20 NOTE — Telephone Encounter (Signed)
Pt notified that lab work was normal--continue with present regime--pt agrees

## 2012-01-25 ENCOUNTER — Inpatient Hospital Stay (HOSPITAL_COMMUNITY): Admission: RE | Admit: 2012-01-25 | Payer: PRIVATE HEALTH INSURANCE | Source: Ambulatory Visit

## 2012-01-27 ENCOUNTER — Other Ambulatory Visit: Payer: Self-pay

## 2012-01-27 MED ORDER — FUROSEMIDE 40 MG PO TABS: 40.0000 mg | ORAL_TABLET | Freq: Every day | ORAL | Status: DC

## 2012-01-27 NOTE — Telephone Encounter (Signed)
Refill sent for lasix 40 mg take one tablet daily. 

## 2012-01-28 ENCOUNTER — Ambulatory Visit (HOSPITAL_COMMUNITY)
Admission: RE | Admit: 2012-01-28 | Discharge: 2012-01-28 | Disposition: A | Discharge status: Home / Self Care | Payer: PRIVATE HEALTH INSURANCE | Source: Ambulatory Visit | Attending: Cardiovascular Disease | Admitting: Cardiovascular Disease

## 2012-01-28 DIAGNOSIS — R0602 Shortness of breath: Secondary | ICD-10-CM

## 2012-01-28 DIAGNOSIS — I428 Other cardiomyopathies: Secondary | ICD-10-CM | POA: Insufficient documentation

## 2012-01-28 MED ORDER — GADOBENATE DIMEGLUMINE 529 MG/ML IV SOLN
25.0000 mL | Freq: Once | INTRAVENOUS | Status: AC | PRN
  Administered 2012-01-28: 25 mL via INTRAVENOUS

## 2012-01-31 ENCOUNTER — Telehealth: Payer: Self-pay

## 2012-01-31 ENCOUNTER — Other Ambulatory Visit: Payer: Self-pay

## 2012-01-31 DIAGNOSIS — I428 Other cardiomyopathies: Secondary | ICD-10-CM

## 2012-01-31 MED ORDER — FUROSEMIDE 40 MG PO TABS: 40.0000 mg | ORAL_TABLET | Freq: Every day | ORAL | Status: DC

## 2012-01-31 NOTE — Telephone Encounter (Signed)
LMOM re: disability papers ready to be picked up. Also faxing originals

## 2012-02-01 NOTE — Telephone Encounter (Signed)
Pt r/c. Will pick up original disability form at [facility]

## 2012-02-07 ENCOUNTER — Ambulatory Visit: Payer: PRIVATE HEALTH INSURANCE | Admitting: Cardiovascular Disease

## 2012-02-10 ENCOUNTER — Telehealth: Payer: Self-pay | Admitting: Cardiovascular Disease

## 2012-02-22 ENCOUNTER — Ambulatory Visit (INDEPENDENT_AMBULATORY_CARE_PROVIDER_SITE_OTHER): Payer: PRIVATE HEALTH INSURANCE | Admitting: Internal Medicine

## 2012-02-22 ENCOUNTER — Encounter: Payer: Self-pay | Admitting: Internal Medicine

## 2012-02-22 VITALS — BP 110/60 | HR 84 | Ht 64.0 in | Wt 146.5 lb

## 2012-02-22 DIAGNOSIS — I509 Heart failure, unspecified: Secondary | ICD-10-CM

## 2012-02-22 DIAGNOSIS — I428 Other cardiomyopathies: Secondary | ICD-10-CM

## 2012-02-22 DIAGNOSIS — I5022 Chronic systolic (congestive) heart failure: Secondary | ICD-10-CM

## 2012-02-22 NOTE — Assessment & Plan Note (Signed)
stable °

## 2012-02-22 NOTE — Progress Notes (Signed)
  HPI  Helen Parks is a 61 y.o. female Referred for  ICD but EF is 37% so not a candidate  Past Medical History  Diagnosis Date  . Cardiomyopathy   . Diverticulosis of colon (without mention of hemorrhage) April 2012    occurred after colonoscopy, [place]  . Menopause     secondary to chemo for BRCA  . Rheumatoid arthritis     on Plaquenil methotrxate Lavenia Atlas)  . Breast cancer 2010    s/p mastectomy, left breast   . Breast cancer 1997  . Cardiomyopathy due to chemotherapy     precipitated by use of Enbrel  2010  . Chronic systolic congestive heart failure     Past Surgical History  Procedure Date  . Rheumatology histroy   . Mastectomy   . Cholecystectomy 1992  . Leep     several years ago, normal paps since  . Foot surgery 1997  . 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 Cardiac catheterization 2010    normal coronary arteries    Current Outpatient Prescriptions  Medication Sig Dispense Refill  . Calcium 150 MG TABS Take by mouth daily.        . carvedilol (COREG) 6.25 MG tablet Take 1 tablet (6.25 mg total) by mouth 2 (two) times daily.  60 tablet  3  . folic acid (FOLVITE) 1 MG tablet Take 1 mg by mouth daily.        . furosemide (LASIX) 40 MG tablet Take 1 tablet (40 mg total) by mouth daily.  30 tablet  6  . lisinopril (PRINIVIL,ZESTRIL) 10 MG tablet Take 1 tablet (10 mg total) by mouth daily.  30 tablet  6  . methotrexate (RHEUMATREX) 2.5 MG tablet Take 2.5 mg by mouth once a week. Take 10 tabs on thursday Caution:Chemotherapy. Protect from light.      . Quercetin (QUERCITIN) POWD 250 mg by Does not apply route daily.        Marland Kitchen spironolactone (ALDACTONE) 25 MG tablet Take 1 tablet (25 mg total) by mouth daily.  90 tablet  3  . vitamin E 400 UNIT capsule Take 400 Units by mouth daily.          Allergies  Allergen Reactions  . Amoxicillin     REACTION: upset stomach    Review of Systems negative except from HPI and PMH  Physical  Exam BP 110/60  Pulse 84  Ht 5\' 4"  (1.626 m)  Wt 146 lb 8 oz (66.452 kg)  BMI 25.15 kg/m2      Assessment and  Plan

## 2012-02-22 NOTE — Assessment & Plan Note (Signed)
Reviewed physiology of CHF

## 2012-03-13 ENCOUNTER — Encounter: Payer: Self-pay | Admitting: Internal Medicine

## 2012-03-13 ENCOUNTER — Ambulatory Visit (INDEPENDENT_AMBULATORY_CARE_PROVIDER_SITE_OTHER): Payer: PRIVATE HEALTH INSURANCE | Admitting: Internal Medicine

## 2012-03-13 VITALS — BP 118/67 | HR 73 | Temp 98.6°F | Wt 148.0 lb

## 2012-03-13 DIAGNOSIS — Z1239 Encounter for other screening for malignant neoplasm of breast: Secondary | ICD-10-CM

## 2012-03-13 DIAGNOSIS — Z124 Encounter for screening for malignant neoplasm of cervix: Secondary | ICD-10-CM

## 2012-03-13 DIAGNOSIS — I428 Other cardiomyopathies: Secondary | ICD-10-CM

## 2012-03-13 MED ORDER — AZITHROMYCIN 250 MG PO TABS: ORAL_TABLET | ORAL | Status: AC

## 2012-03-13 MED ORDER — TAMSULOSIN HCL 0.4 MG PO CAPS: 0.4000 mg | ORAL_CAPSULE | Freq: Every day | ORAL | Status: DC

## 2012-03-13 MED ORDER — FUROSEMIDE 20 MG PO TABS: 20.0000 mg | ORAL_TABLET | Freq: Two times a day (BID) | ORAL | Status: DC

## 2012-03-14 ENCOUNTER — Encounter: Payer: Self-pay | Admitting: Internal Medicine

## 2012-03-14 NOTE — Progress Notes (Signed)
Patient ID: Helen Parks, female   DOB: 05/14/1951, 61 y.o.   MRN: [id number]  Patient Active Problem List  Diagnosis  . CARDIOMYOPATHY, PRIMARY, DILATED  . EDEMA  . Diverticulosis of colon (without mention of hemorrhage)  . Menopause  . Rheumatoid arthritis  . Breast cancer  . Screening for cervical cancer  . Screening for colon cancer  . Chronic systolic congestive heart failure    Subjective:  CC:   Chief Complaint  Patient presents with  . Follow-up    HPI: 6 month follow up on chronic medical issues.  Has had deterioration in EF by recent repeat ECHO  But not enough for ICD, by recent EP study EF of 37%.  Attributed to Plaquenil so this was stopped.  She has had medication changes, but feels that her furosemide dosing of 40 mg daily serves her better if she divides it into two daily doses,  No bothersome nocturia.  Has begun exercising,  Worked her way up from 3 minutes on the Elliptical to 15  minutes daily in the morning without chest pain or severe dyspnea,.  Energy level improving.   Going to [place]) to see her son who is home from [place] this week.    Geraldo Pitter a 61 y.o. female who presents   Past Medical History  Diagnosis Date  . Cardiomyopathy   . Diverticulosis of colon (without mention of hemorrhage) April 2012    occurred after colonoscopy, [place]  . Menopause     secondary to chemo for BRCA  . Rheumatoid arthritis     on Plaquenil methotrxate Lavenia Atlas)  . Breast cancer 2010    s/p mastectomy, left breast   . Breast cancer 1997  . Cardiomyopathy due to chemotherapy     precipitated by use of Enbrel  2010  . Chronic systolic congestive heart failure     Past Surgical History  Procedure Date  . Rheumatology histroy   . Mastectomy 1997  . Foot surgery 2004  . 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 Cardiac catheterization 2010    normal coronary arteries  . Left paraovarian cystectomy 2004    The  following portions of the patient's history were reviewed and updated as appropriate: Allergies, current medications, and problem list.    Review of Systems:   12 Pt  review of systems was negative except those addressed in the HPI,     History   Social History  . Marital Status: Single    Spouse Name: N/A    Number of Children: N/A  . Years of Education: N/A   Occupational History  . dental hygienist     full time   Social History Main Topics  . Smoking status: Former Games developer  . Smokeless tobacco: Not on file   Comment: tobacco use- no  . Alcohol Use: No  . Drug Use: No  . Sexually Active: Not on file   Other Topics Concern  . Not on file   Social History Narrative   Lives with spouse.    Objective:  BP 118/67  Pulse 73  Temp 98.6 F (37 C) (Oral)  Wt 148 lb (67.132 kg)  SpO2 96%  General appearance: alert, cooperative and appears stated age Ears: normal TM's and external ear canals both ears Throat: lips, mucosa, and tongue normal; teeth and gums normal Neck: no adenopathy, no carotid bruit, supple, symmetrical, trachea midline and thyroid not enlarged, symmetric, no tenderness/mass/nodules  Back: symmetric, no curvature. ROM normal. No CVA tenderness. Lungs: clear to auscultation bilaterally Heart: regular rate and rhythm, S1, S2 normal, no murmur, click, rub or gallop Abdomen: soft, non-tender; bowel sounds normal; no masses,  no organomegaly Pulses: 2+ and symmetric Skin: Skin color, texture, turgor normal. No rashes or lesions Lymph nodes: Cervical, supraclavicular, and axillary nodes normal.  Assessment and Plan:  CARDIOMYOPATHY, PRIMARY, DILATED Currently compensated with use of lasix and spironolactone she is exercising daily with good tolerance.  Screening for cervical cancer Done in Dec 2011.   Updated Medication List Outpatient Encounter Prescriptions as of 03/13/2012  Medication Sig Dispense Refill  . Calcium 150 MG TABS Take by mouth  daily.        . carvedilol (COREG) 6.25 MG tablet Take 1 tablet (6.25 mg total) by mouth 2 (two) times daily.  60 tablet  3  . folic acid (FOLVITE) 1 MG tablet Take 1 mg by mouth daily.        . furosemide (LASIX) 20 MG tablet Take 1 tablet (20 mg total) by mouth 2 (two) times daily.  180 tablet  3  . lisinopril (PRINIVIL,ZESTRIL) 10 MG tablet Take 1 tablet (10 mg total) by mouth daily.  30 tablet  6  . methotrexate (RHEUMATREX) 2.5 MG tablet Take 2.5 mg by mouth once a week. Take 10 tabs on thursday Caution:Chemotherapy. Protect from light.      . Quercetin (QUERCITIN) POWD 250 mg by Does not apply route daily.        Marland Kitchen spironolactone (ALDACTONE) 25 MG tablet Take 1 tablet (25 mg total) by mouth daily.  90 tablet  3  . vitamin E 400 UNIT capsule Take 400 Units by mouth daily.        Marland Kitchen DISCONTD: furosemide (LASIX) 40 MG tablet Take 1 tablet (40 mg total) by mouth daily.  30 tablet  6  . azithromycin (ZITHROMAX) 250 MG tablet 2 tablets on day 1, then 1 tablet daily for 4 days  6 each  0  . Tamsulosin HCl (FLOMAX) 0.4 MG CAPS Take 1 capsule (0.4 mg total) by mouth daily.  30 capsule  3     Orders Placed This Encounter  Procedures  . MM Digital Screening  . COMPLETE METABOLIC PANEL WITH GFR  . HM PAP SMEAR  . HM COLONOSCOPY  . HM COLONOSCOPY    No Follow-up on file.

## 2012-03-14 NOTE — Assessment & Plan Note (Signed)
Currently compensated with use of lasix and spironolactone she is exercising daily with good tolerance.

## 2012-03-14 NOTE — Assessment & Plan Note (Signed)
Done in Dec 2011.

## 2012-03-20 ENCOUNTER — Encounter: Payer: Self-pay | Admitting: Cardiovascular Disease

## 2012-03-20 ENCOUNTER — Ambulatory Visit (INDEPENDENT_AMBULATORY_CARE_PROVIDER_SITE_OTHER): Payer: PRIVATE HEALTH INSURANCE | Admitting: Cardiovascular Disease

## 2012-03-20 VITALS — BP 100/70 | HR 84 | Ht 64.0 in | Wt 149.2 lb

## 2012-03-20 DIAGNOSIS — I429 Cardiomyopathy, unspecified: Secondary | ICD-10-CM

## 2012-03-20 DIAGNOSIS — I509 Heart failure, unspecified: Secondary | ICD-10-CM

## 2012-03-20 DIAGNOSIS — I428 Other cardiomyopathies: Secondary | ICD-10-CM

## 2012-03-20 DIAGNOSIS — I5022 Chronic systolic (congestive) heart failure: Secondary | ICD-10-CM

## 2012-03-20 NOTE — Assessment & Plan Note (Signed)
She is back in [place] Heart Association class II after recent worsening. She appears to be euvolemic. Ejection fraction on cardiac MRI improved to 37%. The left ventricle is still significantly dilated. She is currently on good medical therapy. I am unable to increase her beta blocker or ACE inhibitor at this time due to borderline low blood pressure. She is due to have labs performed soon to check renal function and electrolytes. Followup in 6 months.

## 2012-03-20 NOTE — Progress Notes (Signed)
HPI  Helen Parks is a pleasant 49 old woman with a history of nonischemic cardiomyopathy, normal coronary arteries by catheterization in 2010, history of rheumatoid arthritis, breast cancer, status post mastectomy and chemotherapy in 1997 with no radiation therapy with ejection fraction noted to be 30% in 2005. MUGA scan in 1997 was normal. She presents for routine follow up. Her cardiomyopathy was felt to be due to previous chemotherapy. She had an episode of heart failure in 2010 which was likely triggered by the use of Enbrel for rheumatoid arthritis.  Her initial ejection fraction was 25-30% which improved to 35-40% in 2011after starting Coreg and Lisinopril. I saw her in April for a followup visit. An echocardiogram was performed which showed significant worsening in her left ventricular function. The left ventricle was more dilated with an ejection fraction of 25%. I started her on spironolactone 25 mg once daily. Hydroxychloroquine was discontinued. She had an emergency room visit for worsening dyspnea and elevated BNP. She was started on furosemide at that time and has been on. She had gradual improvement in her symptoms. A cardiac MRI was performed which showed moderately dilated left ventricle with an ejection fraction of 37%. She is currently feeling much better. Her dyspnea has improved significantly. She started an exercise program. She denies orthopnea or PND. She recently flew to [place] state to visit her son who just came back from [place].    Allergies  Allergen Reactions  . Amoxicillin     REACTION: upset stomach     Current Outpatient Prescriptions on File Prior to Visit  Medication Sig Dispense Refill  . Calcium 150 MG TABS Take by mouth daily.        . carvedilol (COREG) 6.25 MG tablet Take 1 tablet (6.25 mg total) by mouth 2 (two) times daily.  60 tablet  3  . folic acid (FOLVITE) 1 MG tablet Take 1 mg by mouth daily.        . furosemide (LASIX) 20 MG tablet Take  1 tablet (20 mg total) by mouth 2 (two) times daily.  180 tablet  3  . lisinopril (PRINIVIL,ZESTRIL) 10 MG tablet Take 1 tablet (10 mg total) by mouth daily.  30 tablet  6  . methotrexate (RHEUMATREX) 2.5 MG tablet Take 2.5 mg by mouth once a week. Take 10 tabs on thursday Caution:Chemotherapy. Protect from light.      . Quercetin (QUERCITIN) POWD 250 mg by Does not apply route daily.        Marland Kitchen spironolactone (ALDACTONE) 25 MG tablet Take 1 tablet (25 mg total) by mouth daily.  90 tablet  3  . Tamsulosin HCl (FLOMAX) 0.4 MG CAPS Take 1 capsule (0.4 mg total) by mouth daily.  30 capsule  3  . vitamin E 400 UNIT capsule Take 400 Units by mouth daily.           Past Medical History  Diagnosis Date  . Cardiomyopathy   . Diverticulosis of colon (without mention of hemorrhage) April 2012    occurred after colonoscopy, [place]  . Menopause     secondary to chemo for BRCA  . Rheumatoid arthritis     on Plaquenil methotrxate Lavenia Atlas)  . Breast cancer 2010    s/p mastectomy, left breast   . Breast cancer 1997  . Cardiomyopathy due to chemotherapy     precipitated by use of Enbrel  2010  . Chronic systolic congestive heart failure      Past Surgical History  Procedure  Date  . Rheumatology histroy   . Mastectomy 1997  . Foot surgery 2004  . 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 Cardiac catheterization 2010    normal coronary arteries  . Left paraovarian cystectomy 2004     Family History  Problem Relation Age of Onset  . Heart disease Father   . Cancer Paternal Aunt     ovarian  . Heart disease Maternal Grandmother      History   Social History  . Marital Status: Single    Spouse Name: N/A    Number of Children: N/A  . Years of Education: N/A   Occupational History  . dental hygienist     full time   Social History Main Topics  . Smoking status: Former Games developer  . Smokeless tobacco: Not on file   Comment: tobacco use- no  . Alcohol Use:  No  . Drug Use: No  . Sexually Active: Not on file   Other Topics Concern  . Not on file   Social History Narrative   Lives with spouse.     PHYSICAL EXAM   BP 100/70  Pulse 84  Ht 5\' 4"  (1.626 m)  Wt 149 lb 4 oz (67.699 kg)  BMI 25.62 kg/m2  Constitutional: She is oriented to person, place, and time. She appears well-developed and well-nourished. No distress.  HENT: No nasal discharge.  Head: Normocephalic and atraumatic.  Eyes: Pupils are equal and round. Right eye exhibits no discharge. Left eye exhibits no discharge.  Neck: Normal range of motion. Neck supple. No JVD present. No thyromegaly present.  Cardiovascular: Normal rate, regular rhythm, normal heart sounds. Exam reveals no gallop and no friction rub. No murmur heard.  Pulmonary/Chest: Effort normal and breath sounds normal. No stridor. No respiratory distress. She has no wheezes. She has no rales. She exhibits no tenderness.  Abdominal: Soft. Bowel sounds are normal. She exhibits no distension. There is no tenderness. There is no rebound and no guarding.  Musculoskeletal: Normal range of motion. She exhibits no edema and no tenderness.  Neurological: She is alert and oriented to person, place, and time. Coordination normal.  Skin: Skin is warm and dry. No rash noted. She is not diaphoretic. No erythema. No pallor.  Psychiatric: She has a normal mood and affect. Her behavior is normal. Judgment and thought content normal.   EKG: Normal sinus rhythm, low voltage and prolonged QT interval.   ASSESSMENT AND PLAN

## 2012-03-20 NOTE — Patient Instructions (Addendum)
Continue same medications.  Follow up in 6 months.  

## 2012-03-28 ENCOUNTER — Other Ambulatory Visit: Payer: Self-pay

## 2012-03-28 ENCOUNTER — Telehealth: Payer: Self-pay | Admitting: Cardiovascular Disease

## 2012-03-28 DIAGNOSIS — I429 Cardiomyopathy, unspecified: Secondary | ICD-10-CM

## 2012-03-28 NOTE — Telephone Encounter (Signed)
Pt needs standing order for BMP faxed to [facility] to her att'n [phone] Check q3 months Order faxed

## 2012-03-28 NOTE — Telephone Encounter (Signed)
Pt called stating that she is to have a standing order for labs for Dr Kirke Corin at [facility]. Pt says lab needs new order . Please call pt at work number

## 2012-03-29 ENCOUNTER — Other Ambulatory Visit: Payer: Self-pay | Admitting: Cardiovascular Disease

## 2012-03-29 ENCOUNTER — Other Ambulatory Visit: Payer: Self-pay | Admitting: Rheumatology

## 2012-03-29 DIAGNOSIS — I429 Cardiomyopathy, unspecified: Secondary | ICD-10-CM

## 2012-03-29 LAB — BASIC METABOLIC PANEL
Anion Gap: 4 — ABNORMAL LOW (ref 7–16)
BUN: 16 mg/dL (ref 7–18)
Calcium, Total: 9.1 mg/dL (ref 8.5–10.1)
Chloride: 103 mmol/L (ref 98–107)
Co2: 33 mmol/L — ABNORMAL HIGH (ref 21–32)
Creatinine: 0.73 mg/dL (ref 0.60–1.30)
EGFR (African American): >60
EGFR (Non-African Amer.): >60
Glucose: 78 mg/dL (ref 65–99)
Osmolality: 279 (ref 275–301)
Potassium: 4.4 mmol/L (ref 3.5–5.1)
Sodium: 140 mmol/L (ref 136–145)

## 2012-03-29 LAB — CBC WITH DIFFERENTIAL/PLATELET
Basophil #: 0 10*3/uL (ref 0.0–0.1)
Basophil %: 0.4 %
Eosinophil #: 0 10*3/uL (ref 0.0–0.7)
Eosinophil %: 1 %
HCT: 35.1 % (ref 35.0–47.0)
HGB: 12.3 g/dL (ref 12.0–16.0)
Lymphocyte #: 1.3 10*3/uL (ref 1.0–3.6)
Lymphocyte %: 28.2 %
MCH: 33.7 pg (ref 26.0–34.0)
MCHC: 34.9 g/dL (ref 32.0–36.0)
MCV: 96 fL (ref 80–100)
Monocyte #: 0.4 x10 3/mm (ref 0.2–0.9)
Monocyte %: 9.4 %
Neutrophil #: 2.7 10*3/uL (ref 1.4–6.5)
Neutrophil %: 61 %
Platelet: 216 10*3/uL (ref 150–440)
RBC: 3.64 10*6/uL — ABNORMAL LOW (ref 3.80–5.20)
RDW: 14.2 % (ref 11.5–14.5)
WBC: 4.4 10*3/uL (ref 3.6–11.0)

## 2012-03-29 LAB — ALT: SGPT (ALT): 24 U/L

## 2012-03-29 LAB — SEDIMENTATION RATE: Erythrocyte Sed Rate: 14 mm/hr (ref 0–30)

## 2012-03-29 LAB — ALBUMIN: Albumin: 3.9 g/dL (ref 3.4–5.0)

## 2012-03-29 LAB — SGOT (AST)(ARMC): SGOT(AST): 25 U/L (ref 15–37)

## 2012-03-30 ENCOUNTER — Other Ambulatory Visit: Payer: Self-pay | Admitting: Cardiovascular Disease

## 2012-03-30 ENCOUNTER — Other Ambulatory Visit: Payer: Self-pay | Admitting: Rheumatology

## 2012-03-30 ENCOUNTER — Ambulatory Visit: Payer: Self-pay | Admitting: Internal Medicine

## 2012-04-05 LAB — HM MAMMOGRAPHY: HM Mammogram: NORMAL

## 2012-04-13 ENCOUNTER — Encounter: Payer: Self-pay | Admitting: Internal Medicine

## 2012-06-21 ENCOUNTER — Other Ambulatory Visit: Payer: Self-pay | Admitting: Cardiovascular Disease

## 2012-06-21 ENCOUNTER — Other Ambulatory Visit: Payer: Self-pay | Admitting: *Deleted

## 2012-06-21 ENCOUNTER — Other Ambulatory Visit: Payer: Self-pay | Admitting: Rheumatology

## 2012-06-21 DIAGNOSIS — I429 Cardiomyopathy, unspecified: Secondary | ICD-10-CM

## 2012-06-21 LAB — BASIC METABOLIC PANEL
Anion Gap: 7 (ref 7–16)
BUN: 16 mg/dL (ref 7–18)
Calcium, Total: 9 mg/dL (ref 8.5–10.1)
Chloride: 101 mmol/L (ref 98–107)
Co2: 29 mmol/L (ref 21–32)
Creatinine: 0.59 mg/dL — ABNORMAL LOW (ref 0.60–1.30)
EGFR (African American): >60
EGFR (Non-African Amer.): >60
Glucose: 88 mg/dL (ref 65–99)
Osmolality: 274 (ref 275–301)
Potassium: 4.2 mmol/L (ref 3.5–5.1)
Sodium: 137 mmol/L (ref 136–145)

## 2012-06-21 LAB — CBC WITH DIFFERENTIAL/PLATELET
Basophil #: 0 10*3/uL (ref 0.0–0.1)
Basophil %: 0.6 %
Eosinophil #: 0 10*3/uL (ref 0.0–0.7)
Eosinophil %: 0.9 %
HCT: 35.9 % (ref 35.0–47.0)
HGB: 12.3 g/dL (ref 12.0–16.0)
Lymphocyte #: 1.2 10*3/uL (ref 1.0–3.6)
Lymphocyte %: 23.5 %
MCH: 32.5 pg (ref 26.0–34.0)
MCHC: 34.3 g/dL (ref 32.0–36.0)
MCV: 95 fL (ref 80–100)
Monocyte #: 0.5 x10 3/mm (ref 0.2–0.9)
Monocyte %: 9.2 %
Neutrophil #: 3.4 10*3/uL (ref 1.4–6.5)
Neutrophil %: 65.8 %
Platelet: 214 10*3/uL (ref 150–440)
RBC: 3.78 10*6/uL — ABNORMAL LOW (ref 3.80–5.20)
RDW: 13.3 % (ref 11.5–14.5)
WBC: 5.2 10*3/uL (ref 3.6–11.0)

## 2012-06-21 LAB — ALBUMIN: Albumin: 3.7 g/dL (ref 3.4–5.0)

## 2012-06-21 LAB — ALT: SGPT (ALT): 21 U/L (ref 12–78)

## 2012-06-21 LAB — SEDIMENTATION RATE: Erythrocyte Sed Rate: 17 mm/hr (ref 0–30)

## 2012-06-21 LAB — SGOT (AST)(ARMC): SGOT(AST): 18 U/L (ref 15–37)

## 2012-06-21 MED ORDER — LISINOPRIL 10 MG PO TABS: 10.0000 mg | ORAL_TABLET | Freq: Every day | ORAL | Status: DC

## 2012-06-21 MED ORDER — TAMSULOSIN HCL 0.4 MG PO CAPS: 0.4000 mg | ORAL_CAPSULE | Freq: Every day | ORAL | Status: DC

## 2012-06-21 MED ORDER — CARVEDILOL 6.25 MG PO TABS: 6.2500 mg | ORAL_TABLET | Freq: Two times a day (BID) | ORAL | Status: DC

## 2012-06-21 NOTE — Telephone Encounter (Signed)
Refilled Carvedilol and Lisinopril.

## 2012-06-30 ENCOUNTER — Other Ambulatory Visit: Payer: Self-pay | Admitting: Rheumatology

## 2012-06-30 ENCOUNTER — Other Ambulatory Visit: Payer: Self-pay | Admitting: Cardiovascular Disease

## 2012-08-29 ENCOUNTER — Other Ambulatory Visit: Payer: Self-pay | Admitting: *Deleted

## 2012-08-29 ENCOUNTER — Other Ambulatory Visit: Payer: Self-pay | Admitting: Cardiovascular Disease

## 2012-08-29 ENCOUNTER — Other Ambulatory Visit: Payer: Self-pay | Admitting: Rheumatology

## 2012-08-29 DIAGNOSIS — I429 Cardiomyopathy, unspecified: Secondary | ICD-10-CM

## 2012-08-29 LAB — BASIC METABOLIC PANEL
Anion Gap: 5 — ABNORMAL LOW (ref 7–16)
BUN: 16 mg/dL (ref 7–18)
Calcium, Total: 8.7 mg/dL (ref 8.5–10.1)
Chloride: 103 mmol/L (ref 98–107)
Co2: 31 mmol/L (ref 21–32)
Creatinine: 0.64 mg/dL (ref 0.60–1.30)
EGFR (African American): >60
EGFR (Non-African Amer.): >60
Glucose: 131 mg/dL — ABNORMAL HIGH (ref 65–99)
Osmolality: 281 (ref 275–301)
Potassium: 3.8 mmol/L (ref 3.5–5.1)
Sodium: 139 mmol/L (ref 136–145)

## 2012-08-29 LAB — CBC WITH DIFFERENTIAL/PLATELET
Basophil #: 0 10*3/uL (ref 0.0–0.1)
Basophil %: 0.6 %
Eosinophil #: 0.1 10*3/uL (ref 0.0–0.7)
Eosinophil %: 1.1 %
HCT: 35 % (ref 35.0–47.0)
HGB: 11.9 g/dL — ABNORMAL LOW (ref 12.0–16.0)
Lymphocyte #: 1.4 10*3/uL (ref 1.0–3.6)
Lymphocyte %: 27.1 %
MCH: 32.3 pg (ref 26.0–34.0)
MCHC: 34.1 g/dL (ref 32.0–36.0)
MCV: 95 fL (ref 80–100)
Monocyte #: 0.4 x10 3/mm (ref 0.2–0.9)
Monocyte %: 7.1 %
Neutrophil #: 3.4 10*3/uL (ref 1.4–6.5)
Neutrophil %: 64.1 %
Platelet: 223 10*3/uL (ref 150–440)
RBC: 3.69 10*6/uL — ABNORMAL LOW (ref 3.80–5.20)
RDW: 13.9 % (ref 11.5–14.5)
WBC: 5.3 10*3/uL (ref 3.6–11.0)

## 2012-08-29 LAB — ALT: SGPT (ALT): 19 U/L (ref 12–78)

## 2012-08-29 LAB — SEDIMENTATION RATE: Erythrocyte Sed Rate: 31 mm/hr — ABNORMAL HIGH (ref 0–30)

## 2012-08-29 LAB — ALBUMIN: Albumin: 3.8 g/dL (ref 3.4–5.0)

## 2012-08-29 LAB — SGOT (AST)(ARMC): SGOT(AST): 20 U/L (ref 15–37)

## 2012-08-30 ENCOUNTER — Other Ambulatory Visit: Payer: Self-pay | Admitting: Rheumatology

## 2012-08-30 ENCOUNTER — Other Ambulatory Visit: Payer: Self-pay | Admitting: Cardiovascular Disease

## 2012-09-13 ENCOUNTER — Encounter: Payer: Self-pay | Admitting: Internal Medicine

## 2012-09-13 ENCOUNTER — Ambulatory Visit (INDEPENDENT_AMBULATORY_CARE_PROVIDER_SITE_OTHER): Payer: 59 | Admitting: Internal Medicine

## 2012-09-13 VITALS — BP 102/72 | HR 86 | Temp 98.3°F | Resp 16 | Wt 153.8 lb

## 2012-09-13 DIAGNOSIS — R142 Eructation: Secondary | ICD-10-CM

## 2012-09-13 DIAGNOSIS — R141 Gas pain: Secondary | ICD-10-CM

## 2012-09-13 DIAGNOSIS — R143 Flatulence: Secondary | ICD-10-CM

## 2012-09-13 DIAGNOSIS — R140 Abdominal distension (gaseous): Secondary | ICD-10-CM

## 2012-09-13 NOTE — Patient Instructions (Addendum)
Try using "Lactase before any meal with dairy .  You can also switch from cows milk  to Lactaid or use almond milk as a substitute   I will check you for gluten sensitivity  And h Pylori , the bacteria that causes gastritis   If the labs are all normal, We can try a medication called dicyclomine (Bentyl) to be taken before each problematic meal up to 30 minutes before the meal  It is an antispasmodic .

## 2012-09-13 NOTE — Progress Notes (Signed)
Patient ID: Helen Parks, female   DOB: 1951/03/17, 62 y.o.   MRN: [id number]  Patient Active Problem List  Diagnosis  . CARDIOMYOPATHY, PRIMARY, DILATED  . EDEMA  . Diverticulosis of colon (without mention of hemorrhage)  . Menopause  . Rheumatoid arthritis  . Breast cancer  . Screening for cervical cancer  . Screening for colon cancer  . Chronic systolic congestive heart failure  . Postprandial abdominal bloating    Subjective:  CC:   Chief Complaint  Patient presents with  . Follow-up    HPI:   Helen Parks a 62 y.o. female who presents with Gi complants.  For the past several months she has noticed experienced post prandial bloating, cramping and excessive gas after every meal.  She has been taking Align daily at Dr. Reyes Ivan advice.  She has no weight loss, and has 2 to 3 stools daily which are formed. Normal caliber and color.  No diarrhea.    Past Medical History  Diagnosis Date  . Cardiomyopathy   . Diverticulosis of colon (without mention of hemorrhage) April 2012    occurred after colonoscopy, [place]  . Menopause     secondary to chemo for BRCA  . Rheumatoid arthritis     on Plaquenil methotrxate Lavenia Atlas)  . Breast cancer 2010    s/p mastectomy, left breast   . Breast cancer 1997  . Cardiomyopathy due to chemotherapy     precipitated by use of Enbrel  2010  . Chronic systolic congestive heart failure     Past Surgical History  Procedure Date  . Rheumatology histroy   . Mastectomy 1997  . Foot surgery 2004  . 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 Cardiac catheterization 2010    normal coronary arteries  . Left paraovarian cystectomy 2004         The following portions of the patient's history were reviewed and updated as appropriate: Allergies, current medications, and problem list.    Review of Systems:   Patient denies headache, fevers, malaise, unintentional weight loss, skin rash, eye pain, sinus  congestion and sinus pain, sore throat, dysphagia,  hemoptysis , cough, dyspnea, wheezing, chest pain, palpitations, orthopnea, edema, abdominal pain, nausea, melena, diarrhea, constipation, flank pain, dysuria, hematuria, urinary  Frequency, nocturia, numbness, tingling, seizures,  Focal weakness, Loss of consciousness,  Tremor, insomnia, depression, anxiety, and suicidal ideation.         History   Social History  . Marital Status: Single    Spouse Name: N/A    Number of Children: N/A  . Years of Education: N/A   Occupational History  . dental hygienist     full time   Social History Main Topics  . Smoking status: Former Games developer  . Smokeless tobacco: Not on file     Comment: tobacco use- no  . Alcohol Use: No  . Drug Use: No  . Sexually Active: Not on file   Other Topics Concern  . Not on file   Social History Narrative   Lives with spouse.    Objective:  BP 102/72  Pulse 86  Temp 98.3 F (36.8 C) (Oral)  Resp 16  Wt 153 lb 12 oz (69.741 kg)  SpO2 98%  General appearance: alert, cooperative and appears stated age Ears: normal TM's and external ear canals both ears Throat: lips, mucosa, and tongue normal; teeth and gums normal Neck: no adenopathy, no carotid bruit, supple, symmetrical, trachea midline and thyroid  not enlarged, symmetric, no tenderness/mass/nodules Back: symmetric, no curvature. ROM normal. No CVA tenderness. Lungs: clear to auscultation bilaterally Heart: regular rate and rhythm, S1, S2 normal, no murmur, click, rub or gallop Abdomen: soft, non-tender; bowel sounds normal; no masses,  no organomegaly Pulses: 2+ and symmetric Skin: Skin color, texture, turgor normal. No rashes or lesions Lymph nodes: Cervical, supraclavicular, and axillary nodes normal.  Assessment and Plan:  Postprandial abdominal bloating Reviewed various possible etiologies with patient including lactose intolerance , gluten sensitivity and IBS.  celiac panel and H pylori  Ab test were neagtive. recommending trial of Lactase and Beano , followed by trial of dicylcomine for IBS    Updated Medication List Outpatient Encounter Prescriptions as of 09/13/2012  Medication Sig Dispense Refill  . Calcium 150 MG TABS Take by mouth daily.        . carvedilol (COREG) 6.25 MG tablet Take 1 tablet (6.25 mg total) by mouth 2 (two) times daily.  180 tablet  3  . folic acid (FOLVITE) 1 MG tablet Take 1 mg by mouth daily.        . furosemide (LASIX) 20 MG tablet Take 1 tablet (20 mg total) by mouth 2 (two) times daily.  180 tablet  3  . lisinopril (PRINIVIL,ZESTRIL) 10 MG tablet Take 1 tablet (10 mg total) by mouth daily.  90 tablet  3  . Methotrexate Sodium, PF, 200 MG/8ML SOLN Inject 8 mLs as directed once a week.      . Quercetin (QUERCITIN) POWD 250 mg by Does not apply route daily.        Marland Kitchen spironolactone (ALDACTONE) 25 MG tablet Take 1 tablet (25 mg total) by mouth daily.  90 tablet  3  . Tamsulosin HCl (FLOMAX) 0.4 MG CAPS Take 1 capsule (0.4 mg total) by mouth daily.  30 capsule  3  . vitamin E 400 UNIT capsule Take 400 Units by mouth daily.        Marland Kitchen dicyclomine (BENTYL) 10 MG capsule Take 1 capsule (10 mg total) by mouth 4 (four) times daily -  before meals and at bedtime.  90 capsule  1  . [DISCONTINUED] methotrexate (RHEUMATREX) 2.5 MG tablet Take 2.5 mg by mouth once a week. Take 10 tabs on thursday Caution:Chemotherapy. Protect from light.         Orders Placed This Encounter  Procedures  . C-reactive protein  . CBC with Differential  . Sedimentation rate  . Celiac Disease Ab Screen w/Rfx  . H. pylori antibody, IgG  . HM COLONOSCOPY    No Follow-up on file.

## 2012-09-14 LAB — C-REACTIVE PROTEIN: CRP: 1 mg/dL (ref 0.5–20.0)

## 2012-09-14 LAB — SEDIMENTATION RATE: Sed Rate: 39 mm/hr — ABNORMAL HIGH (ref 0–22)

## 2012-09-14 LAB — CBC WITH DIFFERENTIAL/PLATELET
Basophils Absolute: 0 10*3/uL (ref 0.0–0.1)
Basophils Relative: 0.4 % (ref 0.0–3.0)
Eosinophils Absolute: 0.1 10*3/uL (ref 0.0–0.7)
Eosinophils Relative: 1 % (ref 0.0–5.0)
HCT: 34.2 % — ABNORMAL LOW (ref 36.0–46.0)
Hemoglobin: 11.6 g/dL — ABNORMAL LOW (ref 12.0–15.0)
Lymphocytes Relative: 22.4 % (ref 12.0–46.0)
Lymphs Abs: 1.4 10*3/uL (ref 0.7–4.0)
MCHC: 34 g/dL (ref 30.0–36.0)
MCV: 94.3 fl (ref 78.0–100.0)
Monocytes Absolute: 0.5 10*3/uL (ref 0.1–1.0)
Monocytes Relative: 7.7 % (ref 3.0–12.0)
Neutro Abs: 4.2 10*3/uL (ref 1.4–7.7)
Neutrophils Relative %: 68.5 % (ref 43.0–77.0)
Platelets: 262 10*3/uL (ref 150.0–400.0)
RBC: 3.63 Mil/uL — ABNORMAL LOW (ref 3.87–5.11)
RDW: 14 % (ref 11.5–14.6)
WBC: 6.2 10*3/uL (ref 4.5–10.5)

## 2012-09-14 LAB — H. PYLORI ANTIBODY, IGG: H Pylori IgG: NEGATIVE

## 2012-09-15 ENCOUNTER — Telehealth: Payer: Self-pay | Admitting: Internal Medicine

## 2012-09-15 ENCOUNTER — Encounter: Payer: Self-pay | Admitting: Internal Medicine

## 2012-09-15 ENCOUNTER — Other Ambulatory Visit: Payer: Self-pay | Admitting: General Practice

## 2012-09-15 DIAGNOSIS — R140 Abdominal distension (gaseous): Secondary | ICD-10-CM | POA: Insufficient documentation

## 2012-09-15 LAB — CELIAC DISEASE AB SCREEN W/RFX
Antigliadin Abs, IgA: 3 units (ref 0–19)
IgA/Immunoglobulin A, Serum: 151 mg/dL (ref 91–414)
Transglutaminase IgA: <2 U/mL (ref 0–3)

## 2012-09-15 MED ORDER — DICYCLOMINE HCL 10 MG PO CAPS: 10.0000 mg | ORAL_CAPSULE | Freq: Three times a day (TID) | ORAL | Status: DC

## 2012-09-15 NOTE — Assessment & Plan Note (Addendum)
Reviewed various possible etiologies with patient including lactose intolerance , gluten sensitivity and IBS.  celiac panel and H pylori Ab test were neagtive. recommending trial of Lactase and Beano , followed by trial of dicylcomine for IBS

## 2012-09-15 NOTE — Telephone Encounter (Signed)
Her labs showed no indication of either H Pylori gastris or that celiac disease /gluten intolerance as causes for her postprandial bloating and gas.  I have sent an rx for dicyclomine (Bentyl)  To [facility] pharmacy for her to try,  Stat with 1 tablet 15 minutes before meals.  Can increase to 2  Pre meals and if either dose helps will amend rx.

## 2012-09-15 NOTE — Telephone Encounter (Signed)
LMOVM for pt to return call 

## 2012-09-18 ENCOUNTER — Telehealth: Payer: Self-pay | Admitting: Internal Medicine

## 2012-09-18 NOTE — Telephone Encounter (Signed)
Pt notified of the results.  

## 2012-09-18 NOTE — Telephone Encounter (Signed)
Voice mail taking off vm today patient returning someone's call about labs. Don't see that patient was advised of labs. Please advise.

## 2012-09-19 ENCOUNTER — Ambulatory Visit: Payer: PRIVATE HEALTH INSURANCE | Admitting: Cardiovascular Disease

## 2012-09-21 ENCOUNTER — Ambulatory Visit (INDEPENDENT_AMBULATORY_CARE_PROVIDER_SITE_OTHER): Payer: 59 | Admitting: Cardiovascular Disease

## 2012-09-21 ENCOUNTER — Encounter: Payer: Self-pay | Admitting: Cardiovascular Disease

## 2012-09-21 VITALS — BP 100/70 | HR 74 | Ht 64.0 in | Wt 151.2 lb

## 2012-09-21 DIAGNOSIS — I428 Other cardiomyopathies: Secondary | ICD-10-CM

## 2012-09-21 DIAGNOSIS — I5022 Chronic systolic (congestive) heart failure: Secondary | ICD-10-CM

## 2012-09-21 DIAGNOSIS — I509 Heart failure, unspecified: Secondary | ICD-10-CM

## 2012-09-21 NOTE — Telephone Encounter (Signed)
Pt.notified

## 2012-09-21 NOTE — Assessment & Plan Note (Signed)
She is doing very well at this time and currently in [place] Heart Association class II. No evidence of fluid overload. Continue current dose of diuretics. She is on optimal medications for heart failure. The dosages of medications cannot be increased any further at this time due to low blood pressure.

## 2012-09-21 NOTE — Progress Notes (Signed)
HPI  Helen Parks is a pleasant 39 old woman with a history of nonischemic cardiomyopathy, normal coronary arteries by catheterization in 2010, history of rheumatoid arthritis, breast cancer, status post mastectomy and chemotherapy in 1997 with no radiation therapy with ejection fraction noted to be 30% in 2005. MUGA scan in 1997 was normal. She presents for routine follow up. Her cardiomyopathy was felt to be due to previous chemotherapy. She had an episode of heart failure in 2010 which was likely triggered by the use of Enbrel for rheumatoid arthritis.  Her initial ejection fraction was 25-30% which improved to 35-40% in 2011after starting Coreg and Lisinopril. An echocardiogram last year  showed  worsening in her left ventricular function. The left ventricle was more dilated with an ejection fraction of 25%. I started her on spironolactone 25 mg once daily. Hydroxychloroquine was discontinued. She had an emergency room visit for worsening dyspnea and elevated BNP. She was started on furosemide at that time and has been on it.  A cardiac MRI was performed which showed moderately dilated left ventricle with an ejection fraction of 37%. She is doing very well and denies any chest pain or significant dyspnea. No orthopnea, PND or lower extremity edema. Recent labs showed normal renal function and electrolytes.   Allergies  Allergen Reactions  . Amoxicillin     REACTION: upset stomach     Current Outpatient Prescriptions on File Prior to Visit  Medication Sig Dispense Refill  . Calcium 150 MG TABS Take by mouth daily.        . carvedilol (COREG) 6.25 MG tablet Take 1 tablet (6.25 mg total) by mouth 2 (two) times daily.  180 tablet  3  . dicyclomine (BENTYL) 10 MG capsule Take 1 capsule (10 mg total) by mouth 4 (four) times daily -  before meals and at bedtime.  120 capsule  1  . folic acid (FOLVITE) 1 MG tablet Take 1 mg by mouth daily.        . furosemide (LASIX) 20 MG tablet Take 1 tablet (20  mg total) by mouth 2 (two) times daily.  180 tablet  3  . lisinopril (PRINIVIL,ZESTRIL) 10 MG tablet Take 1 tablet (10 mg total) by mouth daily.  90 tablet  3  . Methotrexate Sodium, PF, 200 MG/8ML SOLN Inject 8 mLs as directed once a week.      . Quercetin (QUERCITIN) POWD 250 mg by Does not apply route daily.        Marland Kitchen spironolactone (ALDACTONE) 25 MG tablet Take 1 tablet (25 mg total) by mouth daily.  90 tablet  3  . Tamsulosin HCl (FLOMAX) 0.4 MG CAPS Take 1 capsule (0.4 mg total) by mouth daily.  30 capsule  3  . vitamin E 400 UNIT capsule Take 400 Units by mouth daily.           Past Medical History  Diagnosis Date  . Cardiomyopathy   . Diverticulosis of colon (without mention of hemorrhage) April 2012    occurred after colonoscopy, [place]  . Menopause     secondary to chemo for BRCA  . Rheumatoid arthritis     on Plaquenil methotrxate Helen Parks)  . Breast cancer 2010    s/p mastectomy, left breast   . Breast cancer 1997  . Cardiomyopathy due to chemotherapy     precipitated by use of Enbrel  2010  . Chronic systolic congestive heart failure      Past Surgical History  Procedure Date  .  Rheumatology histroy   . Mastectomy 1997  . Foot surgery 2004  . 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 Cardiac catheterization 2010    normal coronary arteries  . Left paraovarian cystectomy 2004     Family History  Problem Relation Age of Onset  . Heart disease Father   . Cancer Paternal Aunt     ovarian  . Heart disease Maternal Grandmother      History   Social History  . Marital Status: Single    Spouse Name: N/A    Number of Children: N/A  . Years of Education: N/A   Occupational History  . dental hygienist     full time   Social History Main Topics  . Smoking status: Former Games developer  . Smokeless tobacco: Not on file     Comment: tobacco use- no  . Alcohol Use: No  . Drug Use: No  . Sexually Active: Not on file   Other Topics  Concern  . Not on file   Social History Narrative   Lives with spouse.     PHYSICAL EXAM   BP 100/70  Pulse 74  Ht 5\' 4"  (1.626 m)  Wt 151 lb 4 oz (68.607 kg)  BMI 25.96 kg/m2  Constitutional: She is oriented to person, place, and time. She appears well-developed and well-nourished. No distress.  HENT: No nasal discharge.  Head: Normocephalic and atraumatic.  Eyes: Pupils are equal and round. Right eye exhibits no discharge. Left eye exhibits no discharge.  Neck: Normal range of motion. Neck supple. No JVD present. No thyromegaly present.  Cardiovascular: Normal rate, regular rhythm, normal heart sounds. Exam reveals no gallop and no friction rub. No murmur heard.  Pulmonary/Chest: Effort normal and breath sounds normal. No stridor. No respiratory distress. She has no wheezes. She has no rales. She exhibits no tenderness.  Abdominal: Soft. Bowel sounds are normal. She exhibits no distension. There is no tenderness. There is no rebound and no guarding.  Musculoskeletal: Normal range of motion. She exhibits no edema and no tenderness.  Neurological: She is alert and oriented to person, place, and time. Coordination normal.  Skin: Skin is warm and dry. No rash noted. She is not diaphoretic. No erythema. No pallor.  Psychiatric: She has a normal mood and affect. Her behavior is normal. Judgment and thought content normal.   [id number]  Rhythm  Low voltage in precordial leads.   -Old anteroseptal infarct.   -  Nonspecific T-abnormality.   ABNORMAL    ASSESSMENT AND PLAN

## 2012-09-21 NOTE — Patient Instructions (Addendum)
Continue same medications.  Follow up in 6 months.  

## 2012-09-28 ENCOUNTER — Telehealth: Payer: Self-pay | Admitting: Internal Medicine

## 2012-09-28 NOTE — Telephone Encounter (Signed)
Appointment Request From:  Mitchel Honour With Provider: Duncan Dull, MD [-Primary Care Physician-] Preferred Date Range:  Any date 09/27/2012 or later Preferred Times: Wed Afternoon  Reason for visit: Office Visit Comments: I need to know when to schedule my follow up visit, Dr Darrick Huntsman was going to decide after she got test results back.

## 2012-09-28 NOTE — Telephone Encounter (Signed)
Per last e mail,  i offered her an antispasmodic for her bowels.  Has she tried it?

## 2012-09-28 NOTE — Telephone Encounter (Signed)
See note below.  If she has not tried the dicylcomine and wants to try it ,  Call it in per chart and schedule a 2 week follow up.If she has tried it and it has not helped, I will see her next week.

## 2012-09-29 NOTE — Telephone Encounter (Signed)
Pt scheduled for 2/19 states she has a hard time remembering to take meds.

## 2012-09-29 NOTE — Telephone Encounter (Signed)
LMOVM for pt to return call 

## 2012-10-18 ENCOUNTER — Encounter: Payer: Self-pay | Admitting: Internal Medicine

## 2012-10-18 ENCOUNTER — Ambulatory Visit (INDEPENDENT_AMBULATORY_CARE_PROVIDER_SITE_OTHER): Payer: 59 | Admitting: Internal Medicine

## 2012-10-18 VITALS — BP 102/74 | HR 84 | Temp 98.1°F | Resp 16 | Wt 154.0 lb

## 2012-10-18 DIAGNOSIS — R142 Eructation: Secondary | ICD-10-CM

## 2012-10-18 DIAGNOSIS — R140 Abdominal distension (gaseous): Secondary | ICD-10-CM

## 2012-10-18 DIAGNOSIS — R141 Gas pain: Secondary | ICD-10-CM

## 2012-10-18 MED ORDER — CULTURELLE DIGESTIVE HEALTH PO CAPS: 1.0000 | ORAL_CAPSULE | Freq: Every day | ORAL | Status: DC

## 2012-10-18 NOTE — Progress Notes (Signed)
Patient ID: Helen Parks, female   DOB: 05/21/1951, 62 y.o.   MRN: [id number]   Patient Active Problem List  Diagnosis  . CARDIOMYOPATHY, PRIMARY, DILATED  . EDEMA  . Diverticulosis of colon (without mention of hemorrhage)  . Menopause  . Rheumatoid arthritis  . Breast cancer  . Screening for cervical cancer  . Screening for colon cancer  . Chronic systolic congestive heart failure  . Postprandial abdominal bloating    Subjective:  CC:   Chief Complaint  Patient presents with  . Follow-up    HPI:   Helen Parks a 62 y.o. female who presents for one  month follow up on postprandial abdominal bloating. For the past several months she has experienced post prandial bloating, cramping and excessive gas after every meal.  She has been taking Align daily at Dr. Reyes Ivan advice.  She has no weight loss, and has 2 to 3 stools daily which are formed. Normal caliber and color.  No diarrhea.  Her symptoms have not changed much despite a trial of beano and lactase and dietary modification.  Her symptoms started after her last colonoscopy.  She notes that they occur mainly after her lunch time meal. 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   No unintentional weight loss.  She has not tried the bentyl.     Past Medical History  Diagnosis Date  . Cardiomyopathy   . Diverticulosis of colon (without mention of hemorrhage) April 2012    occurred after colonoscopy, [place]  . Menopause     secondary to chemo for BRCA  . Rheumatoid arthritis     on Plaquenil methotrxate Lavenia Atlas)  . Breast cancer 2010    s/p mastectomy, left breast   . Breast cancer 1997  . Cardiomyopathy due to chemotherapy     precipitated by use of Enbrel  2010  . Chronic systolic congestive heart failure     Past Surgical History  Procedure Laterality Date  . Rheumatology histroy    . Mastectomy   1997  . Foot surgery  2004  . 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 Cardiac catheterization  2010    normal coronary arteries  . Left paraovarian cystectomy  2004       The following portions of the patient's history were reviewed and updated as appropriate: Allergies, current medications, and problem list.    Review of Systems:   Patient denies headache, fevers, malaise, unintentional weight loss, skin rash, eye pain, sinus congestion and sinus pain, sore throat, dysphagia,  hemoptysis , cough, dyspnea, wheezing, chest pain, palpitations, orthopnea, edema, abdominal pain, nausea, melena, diarrhea, constipation, flank pain, dysuria, hematuria, urinary  Frequency, nocturia, numbness, tingling, seizures,  Focal weakness, Loss of consciousness,  Tremor, insomnia, depression, anxiety, and suicidal ideation.     History   Social History  . Marital Status: Single    Spouse Name: N/A    Number of Children: N/A  . Years of Education: N/A   Occupational History  . dental hygienist     full time   Social History Main Topics  . Smoking status: Former Games developer  . Smokeless tobacco: Not on file     Comment: tobacco use- no  . Alcohol Use: No  . Drug Use: No  . Sexually Active: Not on file   Other Topics Concern  . Not on  file   Social History Narrative   Lives with spouse.    Objective:  BP 102/74  Pulse 84  Temp(Src) 98.1 F (36.7 C) (Oral)  Resp 16  Wt 154 lb (69.854 kg)  BMI 26.42 kg/m2  SpO2 96%  General appearance: alert, cooperative and appears stated age Ears: normal TM's and external ear canals both ears Throat: lips, mucosa, and tongue normal; teeth and gums normal Neck: no adenopathy, no carotid bruit, supple, symmetrical, trachea midline and thyroid not enlarged, symmetric, no tenderness/mass/nodules Back: symmetric, no curvature. ROM normal. No CVA tenderness. Lungs: clear to auscultation bilaterally Heart: regular rate and rhythm, S1,  S2 normal, no murmur, click, rub or gallop Abdomen: soft, non-tender; bowel sounds normal; no masses,  no organomegaly Pulses: 2+ and symmetric Skin: Skin color, texture, turgor normal. No rashes or lesions Lymph nodes: Cervical, supraclavicular, and axillary nodes normal.  Assessment and Plan:  Postprandial abdominal bloating No improvement with probiotics started by Dr. Marva Panda, and enzymatic supplements.  She is not having diarrhea.  Symptoms are likely to be IBS , but patient has a history of CA and is post menopausal.  She is concerned about ovarian CA.  Will initiate trialof bentyl and if no improvement.  CA 125 and CT of abdomen and pelvis will be ordered.    Updated Medication List Outpatient Encounter Prescriptions as of 10/18/2012  Medication Sig Dispense Refill  . Calcium 150 MG TABS Take by mouth daily.        . carvedilol (COREG) 6.25 MG tablet Take 1 tablet (6.25 mg total) by mouth 2 (two) times daily.  180 tablet  3  . folic acid (FOLVITE) 1 MG tablet Take 1 mg by mouth daily.        . furosemide (LASIX) 20 MG tablet Take 1 tablet (20 mg total) by mouth 2 (two) times daily.  180 tablet  3  . lisinopril (PRINIVIL,ZESTRIL) 10 MG tablet Take 1 tablet (10 mg total) by mouth daily.  90 tablet  3  . Methotrexate Sodium, PF, 200 MG/8ML SOLN Inject 8 mLs as directed once a week.      . Quercetin (QUERCITIN) POWD 250 mg by Does not apply route daily.        Marland Kitchen spironolactone (ALDACTONE) 25 MG tablet Take 1 tablet (25 mg total) by mouth daily.  90 tablet  3  . Tamsulosin HCl (FLOMAX) 0.4 MG CAPS Take 1 capsule (0.4 mg total) by mouth daily.  30 capsule  3  . vitamin E 400 UNIT capsule Take 400 Units by mouth daily.        Marland Kitchen dicyclomine (BENTYL) 10 MG capsule Take 1 capsule (10 mg total) by mouth 4 (four) times daily -  before meals and at bedtime.  120 capsule  1  . Lactobacillus-Inulin (CULTURELLE DIGESTIVE HEALTH) CAPS Take 1 capsule by mouth daily.  30 capsule  1   No  facility-administered encounter medications on file as of 10/18/2012.     No orders of the defined types were placed in this encounter.    No Follow-up on file.

## 2012-10-21 NOTE — Assessment & Plan Note (Addendum)
No improvement with probiotics started by Dr. Marva Panda, and enzymatic supplements.  She is not having diarrhea.  Symptoms are likely to be IBS , but patient has a history of CA and is post menopausal.  She is concerned about ovarian CA.  Will initiate trialof bentyl and if no improvement.  CA 125 and CT of abdomen and pelvis will be ordered.

## 2012-11-02 ENCOUNTER — Other Ambulatory Visit: Payer: Self-pay | Admitting: *Deleted

## 2012-11-02 MED ORDER — TAMSULOSIN HCL 0.4 MG PO CAPS: 0.4000 mg | ORAL_CAPSULE | Freq: Every day | ORAL | Status: DC

## 2012-11-02 NOTE — Telephone Encounter (Signed)
Med filled.  

## 2012-12-07 ENCOUNTER — Other Ambulatory Visit: Payer: Self-pay | Admitting: Rheumatology

## 2012-12-07 ENCOUNTER — Other Ambulatory Visit: Payer: Self-pay | Admitting: Cardiovascular Disease

## 2012-12-07 ENCOUNTER — Other Ambulatory Visit: Payer: Self-pay | Admitting: *Deleted

## 2012-12-07 DIAGNOSIS — I429 Cardiomyopathy, unspecified: Secondary | ICD-10-CM

## 2012-12-07 LAB — SEDIMENTATION RATE: Erythrocyte Sed Rate: 45 mm/hr — ABNORMAL HIGH (ref 0–30)

## 2012-12-07 LAB — BASIC METABOLIC PANEL
Anion Gap: 5 — ABNORMAL LOW (ref 7–16)
BUN: 14 mg/dL (ref 7–18)
Calcium, Total: 8.6 mg/dL (ref 8.5–10.1)
Chloride: 104 mmol/L (ref 98–107)
Co2: 30 mmol/L (ref 21–32)
Creatinine: 0.69 mg/dL (ref 0.60–1.30)
EGFR (African American): >60
EGFR (Non-African Amer.): >60
Glucose: 112 mg/dL — ABNORMAL HIGH (ref 65–99)
Osmolality: 279 (ref 275–301)
Potassium: 4 mmol/L (ref 3.5–5.1)
Sodium: 139 mmol/L (ref 136–145)

## 2012-12-07 LAB — CBC WITH DIFFERENTIAL/PLATELET
Basophil #: 0.1 10*3/uL (ref 0.0–0.1)
Basophil %: 0.9 %
Eosinophil #: 0.1 10*3/uL (ref 0.0–0.7)
Eosinophil %: 1.1 %
HCT: 35.4 % (ref 35.0–47.0)
HGB: 11.9 g/dL — ABNORMAL LOW (ref 12.0–16.0)
Lymphocyte #: 1.1 10*3/uL (ref 1.0–3.6)
Lymphocyte %: 17.9 %
MCH: 31.9 pg (ref 26.0–34.0)
MCHC: 33.7 g/dL (ref 32.0–36.0)
MCV: 95 fL (ref 80–100)
Monocyte #: 0.5 x10 3/mm (ref 0.2–0.9)
Monocyte %: 8.2 %
Neutrophil #: 4.5 10*3/uL (ref 1.4–6.5)
Neutrophil %: 71.9 %
Platelet: 245 10*3/uL (ref 150–440)
RBC: 3.74 10*6/uL — ABNORMAL LOW (ref 3.80–5.20)
RDW: 13.8 % (ref 11.5–14.5)
WBC: 6.3 10*3/uL (ref 3.6–11.0)

## 2012-12-07 LAB — SGOT (AST)(ARMC): SGOT(AST): 20 U/L (ref 15–37)

## 2012-12-07 LAB — ALT: SGPT (ALT): 19 U/L (ref 12–78)

## 2012-12-07 LAB — ALBUMIN: Albumin: 3.7 g/dL (ref 3.4–5.0)

## 2012-12-08 NOTE — Progress Notes (Signed)
x2 lmtcb

## 2012-12-08 NOTE — Progress Notes (Signed)
lmtcb

## 2012-12-08 NOTE — Progress Notes (Signed)
Pt informed of lab results. 

## 2012-12-21 ENCOUNTER — Encounter: Payer: Self-pay | Admitting: Cardiovascular Disease

## 2012-12-28 ENCOUNTER — Other Ambulatory Visit: Payer: Self-pay | Admitting: Rheumatology

## 2012-12-28 ENCOUNTER — Other Ambulatory Visit: Payer: Self-pay | Admitting: Cardiovascular Disease

## 2013-01-12 ENCOUNTER — Telehealth: Payer: Self-pay | Admitting: *Deleted

## 2013-01-12 NOTE — Telephone Encounter (Signed)
Called patient for my chart message for aunt and scheduled 6 month follow up for self .

## 2013-01-19 ENCOUNTER — Telehealth: Payer: Self-pay | Admitting: Internal Medicine

## 2013-01-19 ENCOUNTER — Emergency Department: Payer: Self-pay | Admitting: Emergency Medicine

## 2013-01-19 LAB — URINALYSIS, COMPLETE
Bilirubin,UR: NEGATIVE
Glucose,UR: NEGATIVE mg/dL (ref 0–75)
Leukocyte Esterase: NEGATIVE
Nitrite: NEGATIVE
Ph: 5 (ref 4.5–8.0)
Protein: NEGATIVE
RBC,UR: 13 /HPF (ref 0–5)
Specific Gravity: 1.018 (ref 1.003–1.030)
Squamous Epithelial: NONE SEEN
WBC UR: 4 /HPF (ref 0–5)

## 2013-01-19 LAB — CBC WITH DIFFERENTIAL/PLATELET
Basophil #: 0 10*3/uL (ref 0.0–0.1)
Basophil %: 0.4 %
Eosinophil #: 0.1 10*3/uL (ref 0.0–0.7)
Eosinophil %: 1 %
HCT: 36.8 % (ref 35.0–47.0)
HGB: 12.7 g/dL (ref 12.0–16.0)
Lymphocyte #: 0.6 10*3/uL — ABNORMAL LOW (ref 1.0–3.6)
Lymphocyte %: 11.9 %
MCH: 31.6 pg (ref 26.0–34.0)
MCHC: 34.4 g/dL (ref 32.0–36.0)
MCV: 92 fL (ref 80–100)
Monocyte #: 0.7 x10 3/mm (ref 0.2–0.9)
Monocyte %: 13.1 %
Neutrophil #: 3.9 10*3/uL (ref 1.4–6.5)
Neutrophil %: 73.6 %
Platelet: 232 10*3/uL (ref 150–440)
RBC: 4.01 10*6/uL (ref 3.80–5.20)
RDW: 13.7 % (ref 11.5–14.5)
WBC: 5.4 10*3/uL (ref 3.6–11.0)

## 2013-01-19 LAB — COMPREHENSIVE METABOLIC PANEL
Albumin: 3.7 g/dL (ref 3.4–5.0)
Alkaline Phosphatase: 63 U/L (ref 50–136)
Anion Gap: 5 — ABNORMAL LOW (ref 7–16)
BUN: 9 mg/dL (ref 7–18)
Bilirubin,Total: 0.3 mg/dL (ref 0.2–1.0)
Calcium, Total: 8.9 mg/dL (ref 8.5–10.1)
Chloride: 105 mmol/L (ref 98–107)
Co2: 28 mmol/L (ref 21–32)
Creatinine: 0.53 mg/dL — ABNORMAL LOW (ref 0.60–1.30)
EGFR (African American): >60
EGFR (Non-African Amer.): >60
Glucose: 91 mg/dL (ref 65–99)
Osmolality: 274 (ref 275–301)
Potassium: 3.4 mmol/L — ABNORMAL LOW (ref 3.5–5.1)
SGOT(AST): 27 U/L (ref 15–37)
SGPT (ALT): 19 U/L (ref 12–78)
Sodium: 138 mmol/L (ref 136–145)
Total Protein: 7.3 g/dL (ref 6.4–8.2)

## 2013-01-19 LAB — LIPASE, BLOOD: Lipase: 122 U/L (ref 73–393)

## 2013-01-19 NOTE — Telephone Encounter (Signed)
Spoke with pt, went to ED as advised by CAN, was just leaving when I called. Labwork and abdominal ultrasound was normal, no gallstones, was told she had a stomach virus. Was given Phenergan. Pt stated no additional concerns at this time. Advised to call back if symptoms persist or worsen, to increase fluid intake and diet as tolerated.

## 2013-01-19 NOTE — Telephone Encounter (Signed)
I disagree with nursing.  The pain could be her gallbladder,  She needs CMET and Lipase drawn and a GB ultrasound., She is a hospital employee

## 2013-01-19 NOTE — Telephone Encounter (Signed)
Patient Information:  Caller Name: Orla  Phone: [phone]  Patient: Helen Parks, Helen Parks  Gender: Female  DOB: [birth year]  Age: 62 Years  PCP: Duncan Dull (Adults only)  Office Follow Up:  Does the office need to follow up with this patient?: No  Instructions For The Office: N/A   Symptoms  Reason For Call & Symptoms: Pain under right shoulder blade and right upper quadrant.  Earlier in the week pt had vomiting and diarrhea which has resolved. Pain is constant. No chest pain.  Reviewed Health History In EMR: Yes  Reviewed Medications In EMR: Yes  Reviewed Allergies In EMR: Yes  Reviewed Surgeries / Procedures: Yes  Date of Onset of Symptoms: 01/18/2013  Guideline(s) Used:  Abdominal Pain - Female  Abdominal Pain - Upper  Disposition Per Guideline:   Go to ED Now  Reason For Disposition Reached:   Pain lasting > 10 minutes and over 40 years old  Advice Given:  Fluids:   Sip clear fluids only (e.g., water, flat soft drinks, or half-strength fruit juice) until the pain is gone for 2 hours. Then slowly return to a regular diet.  Call Back If:  You become worse.  Patient Will Follow Care Advice:  YES

## 2013-01-24 ENCOUNTER — Telehealth: Payer: Self-pay | Admitting: Internal Medicine

## 2013-01-24 ENCOUNTER — Other Ambulatory Visit: Payer: Self-pay | Admitting: Internal Medicine

## 2013-01-24 MED ORDER — BENZONATATE 200 MG PO CAPS: 200.0000 mg | ORAL_CAPSULE | Freq: Two times a day (BID) | ORAL | Status: DC | PRN

## 2013-01-24 NOTE — Telephone Encounter (Signed)
Patient notified by phone as requested message left per DPR.

## 2013-01-24 NOTE — Telephone Encounter (Signed)
Patient requested something for congestion and cough.  Please let her know i sent a nonnarcotic cough tablet to the pharmacy  In her chart.  Seh is [employer] employee   All of the decongestants are OTC only.  Tel her to get sudafed PE  10 mg take 1 or 2 every 6 hours prn congestion

## 2013-02-19 ENCOUNTER — Other Ambulatory Visit: Payer: Self-pay | Admitting: *Deleted

## 2013-02-19 MED ORDER — SPIRONOLACTONE 25 MG PO TABS: 25.0000 mg | ORAL_TABLET | Freq: Every day | ORAL | Status: DC

## 2013-02-19 NOTE — Telephone Encounter (Signed)
Refill Spironolactone sent to [facility].

## 2013-03-01 ENCOUNTER — Other Ambulatory Visit: Payer: Self-pay | Admitting: Rheumatology

## 2013-03-01 ENCOUNTER — Other Ambulatory Visit: Payer: Self-pay | Admitting: Cardiovascular Disease

## 2013-03-01 LAB — CBC WITH DIFFERENTIAL/PLATELET
Basophil #: 0.1 10*3/uL (ref 0.0–0.1)
Basophil %: 0.8 %
Eosinophil #: 0.1 10*3/uL (ref 0.0–0.7)
Eosinophil %: 1.9 %
HCT: 33.6 % — ABNORMAL LOW (ref 35.0–47.0)
HGB: 11.5 g/dL — ABNORMAL LOW (ref 12.0–16.0)
Lymphocyte #: 1.1 10*3/uL (ref 1.0–3.6)
Lymphocyte %: 17.9 %
MCH: 31.7 pg (ref 26.0–34.0)
MCHC: 34.3 g/dL (ref 32.0–36.0)
MCV: 92 fL (ref 80–100)
Monocyte #: 0.6 x10 3/mm (ref 0.2–0.9)
Monocyte %: 9.7 %
Neutrophil #: 4.3 10*3/uL (ref 1.4–6.5)
Neutrophil %: 69.7 %
Platelet: 246 10*3/uL (ref 150–440)
RBC: 3.64 10*6/uL — ABNORMAL LOW (ref 3.80–5.20)
RDW: 14.5 % (ref 11.5–14.5)
WBC: 6.2 10*3/uL (ref 3.6–11.0)

## 2013-03-01 LAB — BASIC METABOLIC PANEL
Anion Gap: 4 — ABNORMAL LOW (ref 7–16)
BUN: 13 mg/dL (ref 7–18)
Calcium, Total: 8.7 mg/dL (ref 8.5–10.1)
Chloride: 105 mmol/L (ref 98–107)
Co2: 31 mmol/L (ref 21–32)
Creatinine: 0.88 mg/dL (ref 0.60–1.30)
EGFR (African American): >60
EGFR (Non-African Amer.): >60
Glucose: 102 mg/dL — ABNORMAL HIGH (ref 65–99)
Osmolality: 280 (ref 275–301)
Potassium: 4.4 mmol/L (ref 3.5–5.1)
Sodium: 140 mmol/L (ref 136–145)

## 2013-03-01 LAB — CREATININE, SERUM
Creatinine: 0.92 mg/dL (ref 0.60–1.30)
EGFR (African American): >60
EGFR (Non-African Amer.): >60

## 2013-03-01 LAB — SEDIMENTATION RATE: Erythrocyte Sed Rate: 34 mm/hr — ABNORMAL HIGH (ref 0–30)

## 2013-03-01 LAB — ALBUMIN: Albumin: 3.5 g/dL (ref 3.4–5.0)

## 2013-03-01 LAB — ALT: SGPT (ALT): 18 U/L (ref 12–78)

## 2013-03-01 LAB — SGOT (AST)(ARMC): SGOT(AST): 17 U/L (ref 15–37)

## 2013-03-05 ENCOUNTER — Telehealth: Payer: Self-pay | Admitting: Internal Medicine

## 2013-03-05 DIAGNOSIS — Z803 Family history of malignant neoplasm of breast: Secondary | ICD-10-CM

## 2013-03-05 NOTE — Telephone Encounter (Signed)
Pt is calling and needing to schedule her mammo. I see there is already an order in there from 2013 ??? She was wanting to go ahead and schdeule

## 2013-03-05 NOTE — Telephone Encounter (Signed)
mammo ordered,  She has history of breast cancer so they have to be diagnostic,

## 2013-03-07 ENCOUNTER — Encounter: Payer: Self-pay | Admitting: Cardiovascular Disease

## 2013-03-12 ENCOUNTER — Other Ambulatory Visit: Payer: Self-pay | Admitting: *Deleted

## 2013-03-12 MED ORDER — TAMSULOSIN HCL 0.4 MG PO CAPS: 0.4000 mg | ORAL_CAPSULE | Freq: Every day | ORAL | Status: DC

## 2013-03-26 ENCOUNTER — Other Ambulatory Visit: Payer: Self-pay | Admitting: *Deleted

## 2013-03-26 MED ORDER — FUROSEMIDE 20 MG PO TABS: 20.0000 mg | ORAL_TABLET | Freq: Two times a day (BID) | ORAL | Status: DC

## 2013-03-29 ENCOUNTER — Ambulatory Visit (INDEPENDENT_AMBULATORY_CARE_PROVIDER_SITE_OTHER): Payer: 59 | Admitting: Cardiovascular Disease

## 2013-03-29 ENCOUNTER — Ambulatory Visit: Payer: 59 | Admitting: Cardiovascular Disease

## 2013-03-29 ENCOUNTER — Encounter: Payer: Self-pay | Admitting: Cardiovascular Disease

## 2013-03-29 VITALS — BP 98/68 | HR 84 | Ht 64.0 in | Wt 147.0 lb

## 2013-03-29 DIAGNOSIS — I428 Other cardiomyopathies: Secondary | ICD-10-CM

## 2013-03-29 DIAGNOSIS — I5022 Chronic systolic (congestive) heart failure: Secondary | ICD-10-CM

## 2013-03-29 DIAGNOSIS — I509 Heart failure, unspecified: Secondary | ICD-10-CM

## 2013-03-29 NOTE — Patient Instructions (Addendum)
Your physician has requested that you have an echocardiogram. Echocardiography is a painless test that uses sound waves to create images of your heart. It provides your doctor with information about the size and shape of your heart and how well your heart's chambers and valves are working. This procedure takes approximately one hour. There are no restrictions for this procedure.  Continue same medications.   Follow up in 6 months.  

## 2013-03-29 NOTE — Progress Notes (Signed)
HPI  [name] is a pleasant 62 year old woman with a history of nonischemic cardiomyopathy, normal coronary arteries by catheterization in 2010, history of rheumatoid arthritis, breast cancer, status post mastectomy and chemotherapy in 1997 with no radiation therapy with ejection fraction noted to be 30% in 2005. MUGA scan in 1997 was normal. She presents for routine follow up. Her cardiomyopathy was felt to be due to previous chemotherapy. She had an episode of heart failure in 2010 which was likely triggered by the use of Enbrel for rheumatoid arthritis.  Her initial ejection fraction was 25-30% which improved to 35-40% in 2011after starting Coreg and Lisinopril. An echocardiogram in 2013 showed  worsening in her left ventricular function. The left ventricle was more dilated with an ejection fraction of 25%. I started her on spironolactone 25 mg once daily. Hydroxychloroquine was discontinued due to concerns of possible cardiotoxicity. She had an emergency room visit for worsening dyspnea and elevated BNP. She was started on furosemide at that time and has been on it.  A cardiac MRI was performed which showed moderately dilated left ventricle with an ejection fraction of 37%. She is doing very well and denies any chest pain or significant dyspnea. No orthopnea, PND or lower extremity edema. Recent labs showed normal renal function and electrolytes. Rheumatoid arthritis worsened. Dr. Gavin Potters discussed the case with me regarding the possibility of resuming hydroxychloroquine. This was resumed in March at 200 mg once daily with improvement in symptoms. The goal is to increase the dose further.   Allergies  Allergen Reactions  . Amoxicillin     REACTION: upset stomach     Current Outpatient Prescriptions on File Prior to Visit  Medication Sig Dispense Refill  . Calcium 150 MG TABS Take by mouth daily.        . carvedilol (COREG) 6.25 MG tablet Take 1 tablet (6.25 mg total) by mouth 2 (two)  times daily.  180 tablet  3  . folic acid (FOLVITE) 1 MG tablet Take 1 mg by mouth daily.        . furosemide (LASIX) 20 MG tablet Take 1 tablet (20 mg total) by mouth 2 (two) times daily.  180 tablet  1  . Lactobacillus-Inulin (CULTURELLE DIGESTIVE HEALTH) CAPS Take 1 capsule by mouth daily.  30 capsule  1  . lisinopril (PRINIVIL,ZESTRIL) 10 MG tablet Take 1 tablet (10 mg total) by mouth daily.  90 tablet  3  . Quercetin (QUERCITIN) POWD 250 mg by Does not apply route daily.        Marland Kitchen spironolactone (ALDACTONE) 25 MG tablet Take 1 tablet (25 mg total) by mouth daily.  90 tablet  3  . tamsulosin (FLOMAX) 0.4 MG CAPS Take 1 capsule (0.4 mg total) by mouth daily.  30 capsule  5  . vitamin E 400 UNIT capsule Take 400 Units by mouth daily.         No current facility-administered medications on file prior to visit.     Past Medical History  Diagnosis Date  . Cardiomyopathy   . Diverticulosis of colon (without mention of hemorrhage) April 2012    occurred after colonoscopy, [place]  . Menopause     secondary to chemo for BRCA  . Rheumatoid arthritis(714.0)     on Plaquenil methotrxate Lavenia Atlas)  . Breast cancer 2010    s/p mastectomy, left breast   . Breast cancer 1997  . Cardiomyopathy due to chemotherapy     precipitated by use of  Enbrel  2010  . Chronic systolic congestive heart failure      Past Surgical History  Procedure Laterality Date  . Rheumatology histroy    . Mastectomy  1997  . Foot surgery  2004  . 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 Cardiac catheterization  2010    normal coronary arteries  . Left paraovarian cystectomy  2004     Family History  Problem Relation Age of Onset  . Heart disease Father   . Cancer Paternal Aunt     ovarian  . Heart disease Maternal Grandmother      History   Social History  . Marital Status: Single    Spouse Name: N/A    Number of Children: N/A  . Years of Education: N/A   Occupational  History  . dental hygienist     full time   Social History Main Topics  . Smoking status: Former Games developer  . Smokeless tobacco: Not on file     Comment: tobacco use- no  . Alcohol Use: No  . Drug Use: No  . Sexually Active: Not on file   Other Topics Concern  . Not on file   Social History Narrative   Lives with spouse.     PHYSICAL EXAM   BP 98/68  Pulse 84  Ht 5\' 4"  (1.626 m)  Wt 147 lb (66.679 kg)  BMI 25.22 kg/m2  Constitutional: She is oriented to person, place, and time. She appears well-developed and well-nourished. No distress.  HENT: No nasal discharge.  Head: Normocephalic and atraumatic.  Eyes: Pupils are equal and round. Right eye exhibits no discharge. Left eye exhibits no discharge.  Neck: Normal range of motion. Neck supple. No JVD present. No thyromegaly present.  Cardiovascular: Normal rate, regular rhythm, normal heart sounds. Exam reveals no gallop and no friction rub. No murmur heard.  Pulmonary/Chest: Effort normal and breath sounds normal. No stridor. No respiratory distress. She has no wheezes. She has no rales. She exhibits no tenderness.  Abdominal: Soft. Bowel sounds are normal. She exhibits no distension. There is no tenderness. There is no rebound and no guarding.  Musculoskeletal: Normal range of motion. She exhibits +1 edema and no tenderness.  The edema is worse on the right side.  Neurological: She is alert and oriented to person, place, and time. Coordination normal.  Skin: Skin is warm and dry. No rash noted. She is not diaphoretic. No erythema. No pallor.  Psychiatric: She has a normal mood and affect. Her behavior is normal. Judgment and thought content normal.   EKG: Sinus  Rhythm  Low voltage -possible pulmonary disease.   ABNORMAL    ASSESSMENT AND PLAN

## 2013-03-29 NOTE — Assessment & Plan Note (Signed)
She appears to be euvolemic and currently New York Heart Association class II. She is on optimal medications. Recent labs showed normal renal function and electrolytes. Given that she was started back on hydroxychloroquine, I recommend an echocardiogram to evaluate ejection fraction before increasing the dose further.

## 2013-03-30 ENCOUNTER — Other Ambulatory Visit: Payer: Self-pay | Admitting: Rheumatology

## 2013-03-30 ENCOUNTER — Other Ambulatory Visit: Payer: Self-pay | Admitting: Cardiovascular Disease

## 2013-04-02 ENCOUNTER — Encounter: Payer: Self-pay | Admitting: Internal Medicine

## 2013-04-03 ENCOUNTER — Encounter: Payer: Self-pay | Admitting: Internal Medicine

## 2013-04-03 ENCOUNTER — Ambulatory Visit: Payer: 59 | Admitting: Internal Medicine

## 2013-04-04 ENCOUNTER — Ambulatory Visit (INDEPENDENT_AMBULATORY_CARE_PROVIDER_SITE_OTHER): Payer: 59 | Admitting: Internal Medicine

## 2013-04-04 ENCOUNTER — Encounter: Payer: Self-pay | Admitting: Internal Medicine

## 2013-04-04 ENCOUNTER — Ambulatory Visit: Payer: Self-pay | Admitting: Cardiovascular Disease

## 2013-04-04 ENCOUNTER — Telehealth: Payer: Self-pay | Admitting: Internal Medicine

## 2013-04-04 VITALS — BP 102/58 | HR 83 | Temp 98.1°F | Resp 14 | Wt 145.5 lb

## 2013-04-04 DIAGNOSIS — R141 Gas pain: Secondary | ICD-10-CM

## 2013-04-04 DIAGNOSIS — M069 Rheumatoid arthritis, unspecified: Secondary | ICD-10-CM

## 2013-04-04 DIAGNOSIS — R140 Abdominal distension (gaseous): Secondary | ICD-10-CM

## 2013-04-04 DIAGNOSIS — C50912 Malignant neoplasm of unspecified site of left female breast: Secondary | ICD-10-CM

## 2013-04-04 DIAGNOSIS — R142 Eructation: Secondary | ICD-10-CM

## 2013-04-04 DIAGNOSIS — C50919 Malignant neoplasm of unspecified site of unspecified female breast: Secondary | ICD-10-CM

## 2013-04-04 DIAGNOSIS — R143 Flatulence: Secondary | ICD-10-CM

## 2013-04-04 DIAGNOSIS — I059 Rheumatic mitral valve disease, unspecified: Secondary | ICD-10-CM

## 2013-04-04 NOTE — Progress Notes (Signed)
Patient ID: Helen Parks, female   DOB: February 05, 1951, 62 y.o.   MRN: [id number]   Patient Active Problem List   Diagnosis Date Noted  . Postprandial abdominal bloating 09/15/2012  . Chronic systolic congestive heart failure   . Screening for cervical cancer 09/15/2011  . Screening for colon cancer 09/15/2011  . Diverticulosis of colon (without mention of hemorrhage)   . Menopause   . Rheumatoid arthritis(714.0)   . Breast cancer   . EDEMA 07/15/2010  . CARDIOMYOPATHY, PRIMARY, DILATED 11/14/2009    Subjective:  CC:   Chief Complaint  Patient presents with  . Follow-up    6 month    HPI:   Helen Parks a 62 y.o. female who presents for Six-month followup on chronic conditions. She has had recent cardiology evaluation due to resuming of Plaquenil for symptomatic rheumatoid arthritis. The results of her recent echocardiogram are not available. She feels much better since she has resumed Plaquenil. She has lost mobility of her right wrist somewhat but only notices this with work. She has lost 10 pounds since her last visit and attributes this to feeling better on the Plaquenil and being more active. She has no new complaints.  She has determined that her previous history of postprandial abdominal bloating was due to the protein bar that she was eating every morning. Since she has discontinued these she no longer has those symptoms.   Past Medical History  Diagnosis Date  . Cardiomyopathy   . Diverticulosis of colon (without mention of hemorrhage) April 2012    occurred after colonoscopy, [place]  . Menopause     secondary to chemo for BRCA  . Rheumatoid arthritis(714.0)     on Plaquenil methotrxate Lavenia Atlas)  . Breast cancer 1997    s/p mastectomy, left breast   . Cardiomyopathy due to chemotherapy     precipitated by use of Enbrel  2010  . Chronic systolic congestive heart failure     Past Surgical History  Procedure Laterality Date  . Rheumatology histroy     . Mastectomy  1997  . Foot surgery  2004  . 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 Cardiac catheterization  2010    normal coronary arteries       The following portions of the patient's history were reviewed and updated as appropriate: Allergies, current medications, and problem list.    Review of Systems:   12 Pt  review of systems was negative except those addressed in the HPI,     History   Social History  . Marital Status: Single    Spouse Name: N/A    Number of Children: N/A  . Years of Education: N/A   Occupational History  . dental hygienist     full time   Social History Main Topics  . Smoking status: Never Smoker   . Smokeless tobacco: Not on file     Comment: tobacco use- no  . Alcohol Use: No  . Drug Use: No  . Sexually Active: Not on file   Other Topics Concern  . Not on file   Social History Narrative   Lives with spouse.    Objective:  BP 102/58  Pulse 83  Temp(Src) 98.1 F (36.7 C) (Oral)  Resp 14  Wt 145 lb 8 oz (65.998 kg)  BMI 24.96 kg/m2  SpO2 98%  General appearance: alert, cooperative and appears stated age Ears: normal TM's and external ear canals both  ears Throat: lips, mucosa, and tongue normal; teeth and gums normal Neck: no adenopathy, no carotid bruit, supple, symmetrical, trachea midline and thyroid not enlarged, symmetric, no tenderness/mass/nodules Back: symmetric, no curvature. ROM normal. No CVA tenderness. Lungs: clear to auscultation bilaterally Heart: regular rate and rhythm, S1, S2 normal, no murmur, click, rub or gallop Abdomen: soft, non-tender; bowel sounds normal; no masses,  no organomegaly Pulses: 2+ and symmetric Skin: Skin color, texture, turgor normal. No rashes or lesions Lymph nodes: Cervical, supraclavicular, and axillary nodes normal.  Assessment and Plan:  Rheumatoid arthritis(714.0) Symptoms are improving with use of Plaquenil, prescribed by rheumatology. Baseline  echocardiogram is pending.  Postprandial abdominal bloating Result, secondary to high-fiber high-protein bars that she was eating in the morning.  Breast cancer Yearly mammogram has been ordered.   Updated Medication List Outpatient Encounter Prescriptions as of 04/04/2013  Medication Sig Dispense Refill  . Calcium 150 MG TABS Take by mouth daily.        . carvedilol (COREG) 6.25 MG tablet Take 1 tablet (6.25 mg total) by mouth 2 (two) times daily.  180 tablet  3  . folic acid (FOLVITE) 1 MG tablet Take 1 mg by mouth daily.        . furosemide (LASIX) 20 MG tablet Take 1 tablet (20 mg total) by mouth 2 (two) times daily.  180 tablet  1  . hydroxychloroquine (PLAQUENIL) 200 MG tablet Take 200 mg by mouth daily.      . Lactobacillus-Inulin (CULTURELLE DIGESTIVE HEALTH) CAPS Take 1 capsule by mouth daily.  30 capsule  1  . lisinopril (PRINIVIL,ZESTRIL) 10 MG tablet Take 1 tablet (10 mg total) by mouth daily.  90 tablet  3  . methotrexate (50 MG/ML) 1 G injection Inject 1 mg into the vein once a week.      . Quercetin (QUERCITIN) POWD 250 mg by Does not apply route daily.        Marland Kitchen spironolactone (ALDACTONE) 25 MG tablet Take 1 tablet (25 mg total) by mouth daily.  90 tablet  3  . tamsulosin (FLOMAX) 0.4 MG CAPS Take 1 capsule (0.4 mg total) by mouth daily.  30 capsule  5  . vitamin E 400 UNIT capsule Take 400 Units by mouth daily.         No facility-administered encounter medications on file as of 04/04/2013.     Orders Placed This Encounter  Procedures  . [facility]    No Follow-up on file.

## 2013-04-05 ENCOUNTER — Other Ambulatory Visit: Payer: Self-pay

## 2013-04-05 ENCOUNTER — Ambulatory Visit: Payer: Self-pay | Admitting: Internal Medicine

## 2013-04-05 DIAGNOSIS — I5022 Chronic systolic (congestive) heart failure: Secondary | ICD-10-CM

## 2013-04-05 DIAGNOSIS — I428 Other cardiomyopathies: Secondary | ICD-10-CM

## 2013-04-05 NOTE — Assessment & Plan Note (Signed)
Result, secondary to high-fiber high-protein bars that she was eating in the morning.

## 2013-04-05 NOTE — Assessment & Plan Note (Signed)
Symptoms are improving with use of Plaquenil, prescribed by rheumatology. Baseline echocardiogram is pending.

## 2013-04-05 NOTE — Assessment & Plan Note (Signed)
Yearly mammogram has been ordered.

## 2013-04-06 ENCOUNTER — Telehealth: Payer: Self-pay | Admitting: Cardiovascular Disease

## 2013-04-06 NOTE — Telephone Encounter (Signed)
Pt aware of echo results Debbie Adaleah Forget RN  

## 2013-04-06 NOTE — Telephone Encounter (Signed)
New problem  Pt returning a phone call

## 2013-05-25 ENCOUNTER — Other Ambulatory Visit: Payer: Self-pay

## 2013-05-25 ENCOUNTER — Telehealth: Payer: Self-pay

## 2013-05-25 DIAGNOSIS — I422 Other hypertrophic cardiomyopathy: Secondary | ICD-10-CM

## 2013-05-25 DIAGNOSIS — I509 Heart failure, unspecified: Secondary | ICD-10-CM

## 2013-05-25 NOTE — Telephone Encounter (Signed)
Spoke w/ pt.  Will fax order for [phone] for BMET.

## 2013-05-25 NOTE — Telephone Encounter (Signed)
Pt states her lab orders have expired and needs to be renewed. States usually they are renewed for a year. Please call.

## 2013-05-25 NOTE — Telephone Encounter (Signed)
That is fine 

## 2013-05-25 NOTE — Telephone Encounter (Signed)
Pt states she needs a standing order for her BMET every 3 months at [facility].

## 2013-05-28 ENCOUNTER — Other Ambulatory Visit: Payer: Self-pay

## 2013-05-28 ENCOUNTER — Other Ambulatory Visit: Payer: Self-pay | Admitting: Cardiovascular Disease

## 2013-05-28 ENCOUNTER — Other Ambulatory Visit: Payer: Self-pay | Admitting: Rheumatology

## 2013-05-28 DIAGNOSIS — I422 Other hypertrophic cardiomyopathy: Secondary | ICD-10-CM

## 2013-05-28 DIAGNOSIS — M069 Rheumatoid arthritis, unspecified: Secondary | ICD-10-CM

## 2013-05-28 DIAGNOSIS — C50912 Malignant neoplasm of unspecified site of left female breast: Secondary | ICD-10-CM

## 2013-05-28 DIAGNOSIS — I509 Heart failure, unspecified: Secondary | ICD-10-CM

## 2013-05-28 LAB — SEDIMENTATION RATE: Erythrocyte Sed Rate: 11 mm/hr (ref 0–30)

## 2013-05-28 LAB — ALT: SGPT (ALT): 28 U/L (ref 12–78)

## 2013-05-28 LAB — BASIC METABOLIC PANEL
Anion Gap: 3 — ABNORMAL LOW (ref 7–16)
BUN: 18 mg/dL (ref 7–18)
Calcium, Total: 8.9 mg/dL (ref 8.5–10.1)
Chloride: 103 mmol/L (ref 98–107)
Co2: 32 mmol/L (ref 21–32)
Creatinine: 1 mg/dL (ref 0.60–1.30)
EGFR (African American): >60
EGFR (Non-African Amer.): >60
Glucose: 85 mg/dL (ref 65–99)
Osmolality: 277 (ref 275–301)
Potassium: 3.9 mmol/L (ref 3.5–5.1)
Sodium: 138 mmol/L (ref 136–145)

## 2013-05-28 LAB — CBC WITH DIFFERENTIAL/PLATELET
Basophil #: 0.1 10*3/uL (ref 0.0–0.1)
Basophil %: 0.8 %
Eosinophil #: 0.1 10*3/uL (ref 0.0–0.7)
Eosinophil %: 1 %
HCT: 36.3 % (ref 35.0–47.0)
HGB: 12.7 g/dL (ref 12.0–16.0)
Lymphocyte #: 1.5 10*3/uL (ref 1.0–3.6)
Lymphocyte %: 23.5 %
MCH: 32.1 pg (ref 26.0–34.0)
MCHC: 34.9 g/dL (ref 32.0–36.0)
MCV: 92 fL (ref 80–100)
Monocyte #: 0.4 x10 3/mm (ref 0.2–0.9)
Monocyte %: 7.1 %
Neutrophil #: 4.2 10*3/uL (ref 1.4–6.5)
Neutrophil %: 67.6 %
Platelet: 234 10*3/uL (ref 150–440)
RBC: 3.94 10*6/uL (ref 3.80–5.20)
RDW: 14.7 % — ABNORMAL HIGH (ref 11.5–14.5)
WBC: 6.2 10*3/uL (ref 3.6–11.0)

## 2013-05-28 LAB — CREATININE, SERUM
Creatinine: 1.03 mg/dL (ref 0.60–1.30)
EGFR (African American): >60
EGFR (Non-African Amer.): 59 — ABNORMAL LOW

## 2013-05-28 LAB — SGOT (AST)(ARMC): SGOT(AST): 28 U/L (ref 15–37)

## 2013-05-28 LAB — ALBUMIN: Albumin: 3.9 g/dL (ref 3.4–5.0)

## 2013-05-29 ENCOUNTER — Other Ambulatory Visit: Payer: Self-pay

## 2013-05-29 DIAGNOSIS — I509 Heart failure, unspecified: Secondary | ICD-10-CM

## 2013-05-30 ENCOUNTER — Other Ambulatory Visit: Payer: Self-pay | Admitting: Rheumatology

## 2013-07-03 ENCOUNTER — Other Ambulatory Visit: Payer: Self-pay

## 2013-07-03 MED ORDER — CARVEDILOL 6.25 MG PO TABS: 6.2500 mg | ORAL_TABLET | Freq: Two times a day (BID) | ORAL | Status: DC

## 2013-07-03 MED ORDER — LISINOPRIL 10 MG PO TABS: 10.0000 mg | ORAL_TABLET | Freq: Every day | ORAL | Status: DC

## 2013-07-05 ENCOUNTER — Other Ambulatory Visit: Payer: Self-pay

## 2013-08-28 ENCOUNTER — Telehealth: Payer: Self-pay | Admitting: *Deleted

## 2013-08-28 NOTE — Telephone Encounter (Signed)
Patient called and needs orders for labs (BMP) fax to her @ # [phone]

## 2013-08-28 NOTE — Telephone Encounter (Signed)
Order faxed to patient 08/28/13 [phone]

## 2013-08-29 ENCOUNTER — Other Ambulatory Visit: Payer: Self-pay | Admitting: Rheumatology

## 2013-08-29 ENCOUNTER — Other Ambulatory Visit: Payer: Self-pay | Admitting: Cardiovascular Disease

## 2013-08-29 ENCOUNTER — Other Ambulatory Visit: Payer: Self-pay

## 2013-08-29 DIAGNOSIS — I422 Other hypertrophic cardiomyopathy: Secondary | ICD-10-CM

## 2013-08-29 DIAGNOSIS — I509 Heart failure, unspecified: Secondary | ICD-10-CM

## 2013-08-29 DIAGNOSIS — C50912 Malignant neoplasm of unspecified site of left female breast: Secondary | ICD-10-CM

## 2013-08-29 DIAGNOSIS — M069 Rheumatoid arthritis, unspecified: Secondary | ICD-10-CM

## 2013-08-29 LAB — SGOT (AST)(ARMC): SGOT(AST): 25 U/L (ref 15–37)

## 2013-08-29 LAB — CBC WITH DIFFERENTIAL/PLATELET
Basophil #: 0 10*3/uL (ref 0.0–0.1)
Basophil %: 0.4 %
Eosinophil #: 0 10*3/uL (ref 0.0–0.7)
Eosinophil %: 0.9 %
HCT: 36.6 % (ref 35.0–47.0)
HGB: 12.7 g/dL (ref 12.0–16.0)
Lymphocyte #: 1.2 10*3/uL (ref 1.0–3.6)
Lymphocyte %: 20.8 %
MCH: 32.6 pg (ref 26.0–34.0)
MCHC: 34.6 g/dL (ref 32.0–36.0)
MCV: 94 fL (ref 80–100)
Monocyte #: 0.5 x10 3/mm (ref 0.2–0.9)
Monocyte %: 8.6 %
Neutrophil #: 3.8 10*3/uL (ref 1.4–6.5)
Neutrophil %: 69.3 %
Platelet: 222 10*3/uL (ref 150–440)
RBC: 3.88 10*6/uL (ref 3.80–5.20)
RDW: 14.2 % (ref 11.5–14.5)
WBC: 5.5 10*3/uL (ref 3.6–11.0)

## 2013-08-29 LAB — BASIC METABOLIC PANEL
Anion Gap: 3 — ABNORMAL LOW (ref 7–16)
BUN: 11 mg/dL (ref 7–18)
Calcium, Total: 8.8 mg/dL (ref 8.5–10.1)
Chloride: 104 mmol/L (ref 98–107)
Co2: 32 mmol/L (ref 21–32)
Creatinine: 0.58 mg/dL — ABNORMAL LOW (ref 0.60–1.30)
EGFR (African American): >60
EGFR (Non-African Amer.): >60
Glucose: 88 mg/dL (ref 65–99)
Osmolality: 276 (ref 275–301)
Potassium: 4 mmol/L (ref 3.5–5.1)
Sodium: 139 mmol/L (ref 136–145)

## 2013-08-29 LAB — ALT: SGPT (ALT): 25 U/L (ref 12–78)

## 2013-08-29 LAB — CREATININE, SERUM
Creatinine: 0.59 mg/dL — ABNORMAL LOW (ref 0.60–1.30)
EGFR (African American): >60
EGFR (Non-African Amer.): >60

## 2013-08-29 LAB — ALBUMIN: Albumin: 3.8 g/dL (ref 3.4–5.0)

## 2013-08-29 LAB — SEDIMENTATION RATE: Erythrocyte Sed Rate: 10 mm/hr (ref 0–30)

## 2013-08-30 ENCOUNTER — Other Ambulatory Visit: Payer: Self-pay | Admitting: Cardiovascular Disease

## 2013-08-30 ENCOUNTER — Other Ambulatory Visit: Payer: Self-pay | Admitting: Rheumatology

## 2013-08-31 NOTE — Telephone Encounter (Signed)
No other info °

## 2013-10-04 ENCOUNTER — Other Ambulatory Visit: Payer: Self-pay | Admitting: Internal Medicine

## 2013-10-09 ENCOUNTER — Encounter: Payer: Self-pay | Admitting: Cardiovascular Disease

## 2013-10-09 ENCOUNTER — Ambulatory Visit (INDEPENDENT_AMBULATORY_CARE_PROVIDER_SITE_OTHER): Payer: 59 | Admitting: Cardiovascular Disease

## 2013-10-09 VITALS — BP 109/68 | HR 77 | Ht 64.0 in | Wt 144.1 lb

## 2013-10-09 DIAGNOSIS — I428 Other cardiomyopathies: Secondary | ICD-10-CM

## 2013-10-09 DIAGNOSIS — I5022 Chronic systolic (congestive) heart failure: Secondary | ICD-10-CM

## 2013-10-09 DIAGNOSIS — M069 Rheumatoid arthritis, unspecified: Secondary | ICD-10-CM

## 2013-10-09 DIAGNOSIS — I509 Heart failure, unspecified: Secondary | ICD-10-CM

## 2013-10-09 NOTE — Patient Instructions (Addendum)
Continue same medications.   Your physician wants you to follow-up in: 6 months.  You will receive a reminder letter in the mail two months in advance. If you don't receive a letter, please call our office to schedule the follow-up appointment.  

## 2013-10-09 NOTE — Assessment & Plan Note (Signed)
Hydroxychloroquine was resumed with no evidence of worsening heart failure.

## 2013-10-09 NOTE — Assessment & Plan Note (Signed)
She is doing very well and appears to be euvolemic. She is currently New York Heart Association class II. She is on optimal medical therapy. Most recent ejection fraction by cardiac MRI was 37%. Continue medical therapy.

## 2013-10-09 NOTE — Progress Notes (Signed)
HPI  Helen Parks is a pleasant 63 year old woman with a history of nonischemic cardiomyopathy, normal coronary arteries by catheterization in 2010, history of rheumatoid arthritis, breast cancer, status post mastectomy and chemotherapy in 1997 with no radiation therapy with ejection fraction noted to be 30% in 2005. MUGA scan in 1997 was normal. She presents for routine follow up. Her cardiomyopathy was felt to be due to previous chemotherapy. She had an episode of heart failure in 2010 which was likely triggered by the use of Enbrel for rheumatoid arthritis.  Her initial ejection fraction was 25-30% which improved to 35-40% in 2011after starting Coreg and Lisinopril. An echocardiogram in 2013 showed  worsening in her left ventricular function. The left ventricle was more dilated with an ejection fraction of 25%. I started her on spironolactone 25 mg once daily. Hydroxychloroquine was discontinued due to concerns of possible cardiotoxicity. She had an emergency room visit for worsening dyspnea and elevated BNP. She was started on furosemide at that time and has been on it.  A cardiac MRI was performed which showed moderately dilated left ventricle with an ejection fraction of 37%. She is doing very well and denies any chest pain or significant dyspnea. No orthopnea, PND or lower extremity edema. Recent labs showed normal renal function and electrolytes. Hydroxychloroquine was resumed about 6 months ago with no evidence of worsening heart failure.   Allergies  Allergen Reactions  . Amoxicillin     REACTION: upset stomach     Current Outpatient Prescriptions on File Prior to Visit  Medication Sig Dispense Refill  . Calcium 150 MG TABS Take by mouth daily.        . carvedilol (COREG) 6.25 MG tablet Take 1 tablet (6.25 mg total) by mouth 2 (two) times daily.  283 tablet  3  . folic acid (FOLVITE) 1 MG tablet Take 1 mg by mouth daily.        . furosemide (LASIX) 20 MG tablet TAKE ONE TABLET BY  MOUTH 2 TIMES A DAY  180 tablet  1  . hydroxychloroquine (PLAQUENIL) 200 MG tablet Take 200 mg by mouth daily.      . Lactobacillus-Inulin ([place]) CAPS Take 1 capsule by mouth daily.  30 capsule  1  . lisinopril (PRINIVIL,ZESTRIL) 10 MG tablet Take 1 tablet (10 mg total) by mouth daily.  90 tablet  3  . methotrexate (50 MG/ML) 1 G injection Inject 1 mg into the vein once a week.      . Quercetin (QUERCITIN) POWD 250 mg by Does not apply route daily.        Marland Kitchen spironolactone (ALDACTONE) 25 MG tablet Take 1 tablet (25 mg total) by mouth daily.  90 tablet  3  . tamsulosin (FLOMAX) 0.4 MG CAPS capsule TAKE ONE CAPSULE BY MOUTH DAILY  30 capsule  5  . vitamin E 400 UNIT capsule Take 400 Units by mouth daily.         No current facility-administered medications on file prior to visit.     Past Medical History  Diagnosis Date  . Cardiomyopathy   . Diverticulosis of colon (without mention of hemorrhage) April 2012    occurred after colonoscopy, [place]  . Menopause     secondary to chemo for BRCA  . Rheumatoid arthritis(714.0)     on Plaquenil methotrxate Precious Reel)  . Breast cancer 1997    s/p mastectomy, left breast   . Cardiomyopathy due to chemotherapy  precipitated by use of Enbrel  2010  . Chronic systolic congestive heart failure      Past Surgical History  Procedure Laterality Date  . Rheumatology histroy    . Mastectomy  1997  . Foot surgery  2004  . 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 Cardiac catheterization  2010    normal coronary arteries     Family History  Problem Relation Age of Onset  . Heart disease Father   . Cancer Paternal Aunt     ovarian  . Heart disease Maternal Grandmother      History   Social History  . Marital Status: Single    Spouse Name: N/A    Number of Children: N/A  . Years of Education: N/A   Occupational History  . dental hygienist     full time   Social History Main Topics    . Smoking status: Never Smoker   . Smokeless tobacco: Not on file     Comment: tobacco use- no  . Alcohol Use: No  . Drug Use: No  . Sexual Activity: Not on file   Other Topics Concern  . Not on file   Social History Narrative   Lives with spouse.     PHYSICAL EXAM   BP 109/68  Pulse 77  Ht $R'5\' 4"'Bu$  (1.626 m)  Wt 144 lb 2 oz (65.375 kg)  BMI 24.73 kg/m2  Constitutional: She is oriented to person, place, and time. She appears well-developed and well-nourished. No distress.  HENT: No nasal discharge.  Head: Normocephalic and atraumatic.  Eyes: Pupils are equal and round. Right eye exhibits no discharge. Left eye exhibits no discharge.  Neck: Normal range of motion. Neck supple. No JVD present. No thyromegaly present.  Cardiovascular: Normal rate, regular rhythm, normal heart sounds. Exam reveals no gallop and no friction rub. No murmur heard.  Pulmonary/Chest: Effort normal and breath sounds normal. No stridor. No respiratory distress. She has no wheezes. She has no rales. She exhibits no tenderness.  Abdominal: Soft. Bowel sounds are normal. She exhibits no distension. There is no tenderness. There is no rebound and no guarding.  Musculoskeletal: Normal range of motion. She exhibits +1 edema and no tenderness.  The edema is worse on the right side.  Neurological: She is alert and oriented to person, place, and time. Coordination normal.  Skin: Skin is warm and dry. No rash noted. She is not diaphoretic. No erythema. No pallor.  Psychiatric: She has a normal mood and affect. Her behavior is normal. Judgment and thought content normal.   EKG: Sinus  Rhythm  Low voltage - Prolonged QT interval ABNORMAL    ASSESSMENT AND PLAN

## 2013-11-28 ENCOUNTER — Other Ambulatory Visit: Payer: Self-pay | Admitting: Rheumatology

## 2013-11-28 LAB — CBC WITH DIFFERENTIAL/PLATELET
Basophil #: 0 10*3/uL (ref 0.0–0.1)
Basophil %: 0.7 %
Eosinophil #: 0.1 10*3/uL (ref 0.0–0.7)
Eosinophil %: 1.1 %
HCT: 35.5 % (ref 35.0–47.0)
HGB: 11.9 g/dL — ABNORMAL LOW (ref 12.0–16.0)
Lymphocyte #: 1.2 10*3/uL (ref 1.0–3.6)
Lymphocyte %: 23.6 %
MCH: 31.9 pg (ref 26.0–34.0)
MCHC: 33.6 g/dL (ref 32.0–36.0)
MCV: 95 fL (ref 80–100)
Monocyte #: 0.4 x10 3/mm (ref 0.2–0.9)
Monocyte %: 9.1 %
Neutrophil #: 3.2 10*3/uL (ref 1.4–6.5)
Neutrophil %: 65.5 %
Platelet: 207 10*3/uL (ref 150–440)
RBC: 3.73 10*6/uL — ABNORMAL LOW (ref 3.80–5.20)
RDW: 14.2 % (ref 11.5–14.5)
WBC: 4.9 10*3/uL (ref 3.6–11.0)

## 2013-11-28 LAB — SGOT (AST)(ARMC): SGOT(AST): 18 U/L (ref 15–37)

## 2013-11-28 LAB — ALBUMIN: Albumin: 3.6 g/dL (ref 3.4–5.0)

## 2013-11-28 LAB — CREATININE, SERUM
Creatinine: 0.71 mg/dL (ref 0.60–1.30)
EGFR (African American): >60
EGFR (Non-African Amer.): >60

## 2013-11-28 LAB — ALT: SGPT (ALT): 18 U/L (ref 12–78)

## 2013-11-28 LAB — SEDIMENTATION RATE: Erythrocyte Sed Rate: 14 mm/hr (ref 0–30)

## 2013-12-28 ENCOUNTER — Other Ambulatory Visit: Payer: Self-pay | Admitting: Rheumatology

## 2014-02-20 ENCOUNTER — Encounter: Payer: Self-pay | Admitting: Internal Medicine

## 2014-02-20 ENCOUNTER — Ambulatory Visit (INDEPENDENT_AMBULATORY_CARE_PROVIDER_SITE_OTHER): Payer: 59 | Admitting: Internal Medicine

## 2014-02-20 ENCOUNTER — Other Ambulatory Visit (HOSPITAL_COMMUNITY)
Admission: RE | Admit: 2014-02-20 | Discharge: 2014-02-20 | Disposition: A | Discharge status: Home / Self Care | Payer: 59 | Source: Ambulatory Visit | Attending: Internal Medicine | Admitting: Internal Medicine

## 2014-02-20 VITALS — BP 104/62 | HR 84 | Temp 98.8°F | Resp 16 | Ht 64.0 in | Wt 147.5 lb

## 2014-02-20 DIAGNOSIS — Z1151 Encounter for screening for human papillomavirus (HPV): Secondary | ICD-10-CM | POA: Insufficient documentation

## 2014-02-20 DIAGNOSIS — Z01419 Encounter for gynecological examination (general) (routine) without abnormal findings: Secondary | ICD-10-CM | POA: Insufficient documentation

## 2014-02-20 DIAGNOSIS — Z23 Encounter for immunization: Secondary | ICD-10-CM

## 2014-02-20 DIAGNOSIS — M069 Rheumatoid arthritis, unspecified: Secondary | ICD-10-CM

## 2014-02-20 DIAGNOSIS — E785 Hyperlipidemia, unspecified: Secondary | ICD-10-CM

## 2014-02-20 DIAGNOSIS — E559 Vitamin D deficiency, unspecified: Secondary | ICD-10-CM

## 2014-02-20 DIAGNOSIS — R300 Dysuria: Secondary | ICD-10-CM

## 2014-02-20 DIAGNOSIS — I5022 Chronic systolic (congestive) heart failure: Secondary | ICD-10-CM

## 2014-02-20 DIAGNOSIS — N898 Other specified noninflammatory disorders of vagina: Secondary | ICD-10-CM

## 2014-02-20 DIAGNOSIS — Z1231 Encounter for screening mammogram for malignant neoplasm of breast: Secondary | ICD-10-CM

## 2014-02-20 DIAGNOSIS — Z79899 Other long term (current) drug therapy: Secondary | ICD-10-CM

## 2014-02-20 DIAGNOSIS — Z1239 Encounter for other screening for malignant neoplasm of breast: Secondary | ICD-10-CM

## 2014-02-20 DIAGNOSIS — Z0000 Encounter for general adult medical examination without abnormal findings: Secondary | ICD-10-CM

## 2014-02-20 DIAGNOSIS — C50919 Malignant neoplasm of unspecified site of unspecified female breast: Secondary | ICD-10-CM

## 2014-02-20 DIAGNOSIS — Z853 Personal history of malignant neoplasm of breast: Secondary | ICD-10-CM

## 2014-02-20 DIAGNOSIS — I509 Heart failure, unspecified: Secondary | ICD-10-CM

## 2014-02-20 DIAGNOSIS — R5383 Other fatigue: Secondary | ICD-10-CM

## 2014-02-20 DIAGNOSIS — C50912 Malignant neoplasm of unspecified site of left female breast: Secondary | ICD-10-CM

## 2014-02-20 DIAGNOSIS — Z124 Encounter for screening for malignant neoplasm of cervix: Secondary | ICD-10-CM

## 2014-02-20 DIAGNOSIS — R5381 Other malaise: Secondary | ICD-10-CM

## 2014-02-20 LAB — POCT URINALYSIS DIPSTICK
Bilirubin, UA: NEGATIVE
Glucose, UA: NEGATIVE
Ketones, UA: NEGATIVE
Leukocytes, UA: NEGATIVE
Nitrite, UA: NEGATIVE
Protein, UA: NEGATIVE
Spec Grav, UA: <=1.005
Urobilinogen, UA: 0.2
pH, UA: 5

## 2014-02-20 NOTE — Progress Notes (Signed)
Pre-visit discussion using our clinic review tool. No additional management support is needed unless otherwise documented below in the visit note.  

## 2014-02-20 NOTE — Progress Notes (Signed)
Patient ID: Helen Parks, female   DOB: [birth year], 63 y.o.   MRN: [id number]   Subjective:     Helen Parks is a 63 y.o. female and is here for a comprehensive physical exam. The patient reports the following problems.   She was treated  for a bladder infection by Gennie Alma with macrobid x 5 days, but urine culture was ultimately negative.  The antibiotic alleviated the symptoms of suprapubic cramping and  urinary hesitancy but she continues to feel she has a malodorous but scant discharge. It is not increased after intercourse and she is not noting any blood or tissue in the discharge.   History   Social History  . Marital Status: Single    Spouse Name: N/A    Number of Children: N/A  . Years of Education: N/A   Occupational History  . dental hygienist     full time   Social History Main Topics  . Smoking status: Never Smoker   . Smokeless tobacco: Not on file     Comment: tobacco use- no  . Alcohol Use: No  . Drug Use: No  . Sexual Activity: Not on file   Other Topics Concern  . Not on file   Social History Narrative   Lives with spouse.   Health Maintenance  Topic Date Due  . Influenza Vaccine  03/30/2014  . Mammogram  04/05/2014  . Tetanus/tdap  12/21/2015  . Pap Smear  02/20/2017  . Colonoscopy  03/13/2021  . Zostavax  Completed    The following portions of the patient's history were reviewed and updated as appropriate: allergies, current medications, past family history, past medical history, past social history, past surgical history and problem list.  Review of Systems A comprehensive review of systems was negative.   Objective:   BP 104/62  Pulse 84  Temp(Src) 98.8 F (37.1 C) (Oral)  Resp 16  Ht 5\' 4"  (1.626 m)  Wt 147 lb 8 oz (66.906 kg)  BMI 25.31 kg/m2  SpO2 98%  General Appearance:    Alert, cooperative, no distress, appears stated age  Head:    Normocephalic, without obvious abnormality, atraumatic  Eyes:    PERRL, conjunctiva/corneas  clear, EOM's intact, fundi    benign, both eyes  Ears:    Normal TM's and external ear canals, both ears  Nose:   Nares normal, septum midline, mucosa normal, no drainage    or sinus tenderness  Throat:   Lips, mucosa, and tongue normal; teeth and gums normal  Neck:   Supple, symmetrical, trachea midline, no adenopathy;    thyroid:  no enlargement/tenderness/nodules; no carotid   bruit or JVD  Back:     Symmetric, no curvature, ROM normal, no CVA tenderness  Lungs:     Clear to auscultation bilaterally, respirations unlabored  Chest Wall:    No tenderness or deformity   Heart:    Regular rate and rhythm, S1 and S2 normal, no murmur, rub   or gallop  Breast Exam:    S/p left mastectomy, no tenderness, masses, or nipple abnormality  Abdomen:     Soft, non-tender, bowel sounds active all four quadrants,    no masses, no organomegaly  Genitalia:    Pelvic: cervix normal in appearance, external genitalia normal, no adnexal masses or tenderness, no cervical motion tenderness, rectovaginal septum normal, uterus normal size, shape, and consistency and vagina normal without discharge  Extremities:   Extremities normal, atraumatic, no cyanosis or edema  Pulses:  2+ and symmetric all extremities  Skin:   Skin color, texture, turgor normal, no rashes or lesions  Lymph nodes:   Cervical, supraclavicular, and axillary nodes normal  Neurologic:   CNII-XII intact, normal strength, sensation and reflexes    throughout     Assessment and Plan:   Chronic systolic congestive heart failure Currently asymptomatic,  With annual follow up ECHO done recently with EF 30 to 35%   Breast cancer With no recurrence.  She has been released by oncology and has annual mammograms ordered by me.  Rheumatoid arthritis(714.0) Managed with MTX and plaquenil by Precious Reel .  Surveillance labs ordered.   Screening for cervical cancer PAP smear done today per patient preference/   Vaginal discharge Wet prep and  vaginal culture were done today during annual pelvic exam but exam was normal   Updated Medication List Outpatient Encounter Prescriptions as of 02/20/2014  Medication Sig  . Calcium 150 MG TABS Take by mouth daily.    . carvedilol (COREG) 6.25 MG tablet Take 1 tablet (6.25 mg total) by mouth 2 (two) times daily.  . folic acid (FOLVITE) 1 MG tablet Take 1 mg by mouth daily.    . furosemide (LASIX) 20 MG tablet TAKE ONE TABLET BY MOUTH 2 TIMES A DAY  . hydroxychloroquine (PLAQUENIL) 200 MG tablet Take 200 mg by mouth daily.  . Lactobacillus-Inulin ([place]) CAPS Take 1 capsule by mouth daily.  Marland Kitchen lisinopril (PRINIVIL,ZESTRIL) 10 MG tablet Take 1 tablet (10 mg total) by mouth daily.  . methotrexate (50 MG/ML) 1 G injection Inject 1 mg into the vein once a week.  . Quercetin (QUERCITIN) POWD 250 mg by Does not apply route daily.    . tamsulosin (FLOMAX) 0.4 MG CAPS capsule TAKE ONE CAPSULE BY MOUTH DAILY  . vitamin E 400 UNIT capsule Take 400 Units by mouth daily.    Marland Kitchen spironolactone (ALDACTONE) 25 MG tablet Take 1 tablet (25 mg total) by mouth daily.

## 2014-02-20 NOTE — Patient Instructions (Signed)
You had your annual  wellness exam today.  We will repeat your PAP smear in  5 years, sooner if needed   We will schedule your mammogram soon at [facility].   You received the Prevnar  vaccine today.  If you develop a sore arm , use tylenol and ibuprofen for a few days   Please make an appt for fasting labs at your earliest convenience.   We will contact you with the culture /bloodwork results

## 2014-02-21 LAB — CYTOLOGY - PAP

## 2014-02-22 ENCOUNTER — Encounter: Payer: Self-pay | Admitting: Internal Medicine

## 2014-02-22 ENCOUNTER — Other Ambulatory Visit (INDEPENDENT_AMBULATORY_CARE_PROVIDER_SITE_OTHER): Payer: 59

## 2014-02-22 DIAGNOSIS — R5381 Other malaise: Secondary | ICD-10-CM

## 2014-02-22 DIAGNOSIS — E785 Hyperlipidemia, unspecified: Secondary | ICD-10-CM

## 2014-02-22 DIAGNOSIS — R5383 Other fatigue: Principal | ICD-10-CM

## 2014-02-22 DIAGNOSIS — N898 Other specified noninflammatory disorders of vagina: Secondary | ICD-10-CM | POA: Insufficient documentation

## 2014-02-22 DIAGNOSIS — Z79899 Other long term (current) drug therapy: Secondary | ICD-10-CM

## 2014-02-22 DIAGNOSIS — E559 Vitamin D deficiency, unspecified: Secondary | ICD-10-CM

## 2014-02-22 LAB — COMPREHENSIVE METABOLIC PANEL
ALT: 17 U/L (ref 0–35)
AST: 23 U/L (ref 0–37)
Albumin: 4 g/dL (ref 3.5–5.2)
Alkaline Phosphatase: 50 U/L (ref 39–117)
BUN: 14 mg/dL (ref 6–23)
CO2: 28 mEq/L (ref 19–32)
Calcium: 8.8 mg/dL (ref 8.4–10.5)
Chloride: 104 mEq/L (ref 96–112)
Creatinine, Ser: 0.6 mg/dL (ref 0.4–1.2)
GFR: 119.45 mL/min (ref >60.00)
Glucose, Bld: 84 mg/dL (ref 70–99)
Potassium: 4.2 mEq/L (ref 3.5–5.1)
Sodium: 138 mEq/L (ref 135–145)
Total Bilirubin: 0.7 mg/dL (ref 0.2–1.2)
Total Protein: 6.8 g/dL (ref 6.0–8.3)

## 2014-02-22 LAB — WET PREP BY MOLECULAR PROBE
Candida species: NEGATIVE
Gardnerella vaginalis: NEGATIVE
Trichomonas vaginosis: NEGATIVE

## 2014-02-22 LAB — CBC WITH DIFFERENTIAL/PLATELET
Basophils Absolute: 0 10*3/uL (ref 0.0–0.1)
Basophils Relative: 0.3 % (ref 0.0–3.0)
Eosinophils Absolute: 0 10*3/uL (ref 0.0–0.7)
Eosinophils Relative: 0.6 % (ref 0.0–5.0)
HCT: 35.4 % — ABNORMAL LOW (ref 36.0–46.0)
Hemoglobin: 12 g/dL (ref 12.0–15.0)
Lymphocytes Relative: 14.4 % (ref 12.0–46.0)
Lymphs Abs: 0.8 10*3/uL (ref 0.7–4.0)
MCHC: 33.8 g/dL (ref 30.0–36.0)
MCV: 96.4 fl (ref 78.0–100.0)
Monocytes Absolute: 0.6 10*3/uL (ref 0.1–1.0)
Monocytes Relative: 9.7 % (ref 3.0–12.0)
Neutro Abs: 4.3 10*3/uL (ref 1.4–7.7)
Neutrophils Relative %: 75 % (ref 43.0–77.0)
Platelets: 204 10*3/uL (ref 150.0–400.0)
RBC: 3.67 Mil/uL — ABNORMAL LOW (ref 3.87–5.11)
RDW: 14.1 % (ref 11.5–15.5)
WBC: 5.7 10*3/uL (ref 4.0–10.5)

## 2014-02-22 LAB — LIPID PANEL
Cholesterol: 159 mg/dL (ref 0–200)
HDL: 58.2 mg/dL (ref >39.00)
LDL Cholesterol: 87 mg/dL (ref 0–99)
NonHDL: 100.8
Total CHOL/HDL Ratio: 3
Triglycerides: 69 mg/dL (ref 0.0–149.0)
VLDL: 13.8 mg/dL (ref 0.0–40.0)

## 2014-02-22 LAB — VITAMIN D 25 HYDROXY (VIT D DEFICIENCY, FRACTURES): VITD: 16.36 ng/mL

## 2014-02-22 LAB — TSH: TSH: 1.93 u[IU]/mL (ref 0.35–4.50)

## 2014-02-22 NOTE — Assessment & Plan Note (Signed)
With no recurrence.  She has been released by oncology and has annual mammograms ordered by me.

## 2014-02-22 NOTE — Assessment & Plan Note (Signed)
Wet prep and vaginal culture were done today during annual pelvic exam but exam was normal

## 2014-02-22 NOTE — Assessment & Plan Note (Signed)
Managed with MTX and plaquenil by Helen Parks .  Surveillance labs ordered.

## 2014-02-22 NOTE — Assessment & Plan Note (Signed)
PAP smear done today per patient preference 

## 2014-02-22 NOTE — Assessment & Plan Note (Signed)
Currently asymptomatic,  With annual follow up ECHO done recently with EF 30 to 35%

## 2014-02-23 ENCOUNTER — Encounter: Payer: Self-pay | Admitting: Internal Medicine

## 2014-02-23 DIAGNOSIS — E559 Vitamin D deficiency, unspecified: Secondary | ICD-10-CM | POA: Insufficient documentation

## 2014-02-23 MED ORDER — ERGOCALCIFEROL 1.25 MG (50000 UT) PO CAPS: 50000.0000 [IU] | ORAL_CAPSULE | ORAL | Status: DC

## 2014-02-23 NOTE — Addendum Note (Signed)
Addended by: Crecencio Mc on: 02/23/2014 10:08 PM   Modules accepted: Orders

## 2014-02-25 ENCOUNTER — Other Ambulatory Visit: Payer: Self-pay

## 2014-02-25 ENCOUNTER — Telehealth: Payer: Self-pay | Admitting: *Deleted

## 2014-02-25 MED ORDER — SPIRONOLACTONE 25 MG PO TABS: 25.0000 mg | ORAL_TABLET | Freq: Every day | ORAL | Status: DC

## 2014-02-25 NOTE — Telephone Encounter (Signed)
Refill sent for spironolactone  

## 2014-02-25 NOTE — Telephone Encounter (Signed)
[facility] lab called and they cant run the GC/Chlamydia since the clean swab was placed in the vial not the collecting swab

## 2014-02-26 NOTE — Telephone Encounter (Signed)
FYI

## 2014-02-26 NOTE — Telephone Encounter (Signed)
Ok,  Not needed

## 2014-03-20 ENCOUNTER — Telehealth: Payer: Self-pay | Admitting: *Deleted

## 2014-03-20 DIAGNOSIS — R339 Retention of urine, unspecified: Secondary | ICD-10-CM

## 2014-03-20 DIAGNOSIS — R350 Frequency of micturition: Secondary | ICD-10-CM

## 2014-03-20 NOTE — Addendum Note (Signed)
Addended by: Crecencio Mc on: 03/20/2014 12:32 PM   Modules accepted: Orders

## 2014-03-20 NOTE — Telephone Encounter (Signed)
If she would like to try a medication for overactive bladder,  i can call something in.  Otherwise I recommend referral to Urology or Urogyn. For bladder studies

## 2014-03-20 NOTE — Telephone Encounter (Signed)
Pt called states she continues to have frequent urination and she feels like her bladder is not emptying.  Pt was seen on 6.24.15 UA was negative at that time.  Please advise

## 2014-03-20 NOTE — Telephone Encounter (Signed)
Spoke with pt, she would like to see Urology in [place].  Please advise

## 2014-03-20 NOTE — Telephone Encounter (Signed)
Referral is in process as requested to  Urologist in the [place].  Helen Parks will call her with appt

## 2014-03-26 ENCOUNTER — Encounter: Payer: Self-pay | Admitting: Internal Medicine

## 2014-04-08 ENCOUNTER — Encounter: Payer: Self-pay | Admitting: Cardiovascular Disease

## 2014-04-08 ENCOUNTER — Ambulatory Visit (INDEPENDENT_AMBULATORY_CARE_PROVIDER_SITE_OTHER): Payer: 59 | Admitting: Cardiovascular Disease

## 2014-04-08 VITALS — BP 88/62 | HR 70 | Ht 64.0 in | Wt 150.8 lb

## 2014-04-08 DIAGNOSIS — I428 Other cardiomyopathies: Secondary | ICD-10-CM

## 2014-04-08 DIAGNOSIS — I5022 Chronic systolic (congestive) heart failure: Secondary | ICD-10-CM

## 2014-04-08 DIAGNOSIS — I509 Heart failure, unspecified: Secondary | ICD-10-CM

## 2014-04-08 NOTE — Patient Instructions (Signed)
Continue same medications.   Your physician wants you to follow-up in: 6 months.  You will receive a reminder letter in the mail two months in advance. If you don't receive a letter, please call our office to schedule the follow-up appointment.  

## 2014-04-08 NOTE — Assessment & Plan Note (Signed)
She is doing very well and appears to be euvolemic. She is currently New York Heart Association class II. She is on optimal medical therapy. Most recent ejection fraction by cardiac MRI was 37%. Continue medical therapy. I reviewed her labs from June which were unremarkable.

## 2014-04-08 NOTE — Progress Notes (Signed)
HPI  [name] is a pleasant 63 year old woman with a history of nonischemic cardiomyopathy, normal coronary arteries by catheterization in 2010, history of rheumatoid arthritis, breast cancer, status post mastectomy and chemotherapy in 1997 with no radiation therapy with ejection fraction noted to be 30% in 2005. MUGA scan in 1997 was normal. She presents for routine follow up. Her cardiomyopathy was felt to be due to previous chemotherapy. She had an episode of heart failure in 2010 which was likely triggered by the use of Enbrel for rheumatoid arthritis.  Her initial ejection fraction was 25-30% which improved to 35-40% in 2011after starting Coreg and Lisinopril. An echocardiogram in 2013 showed  worsening in her left ventricular function. The left ventricle was more dilated with an ejection fraction of 25%. I started her on spironolactone 25 mg once daily. Hydroxychloroquine was discontinued due to concerns of possible cardiotoxicity. She had an emergency room visit for worsening dyspnea and elevated BNP. She was started on furosemide at that time and has been on it.  A cardiac MRI was performed which showed moderately dilated left ventricle with an ejection fraction of 37%. She is doing very well and denies any chest pain or significant dyspnea. No orthopnea, PND or lower extremity edema. Recent labs showed normal renal function and electrolytes. Hydroxychloroquine was resumed  with no evidence of worsening heart failure. Blood pressure tends to run low but she is asymptomatic.   Allergies  Allergen Reactions  . Amoxicillin     REACTION: upset stomach     Current Outpatient Prescriptions on File Prior to Visit  Medication Sig Dispense Refill  . Calcium 150 MG TABS Take by mouth daily.        . carvedilol (COREG) 6.25 MG tablet Take 1 tablet (6.25 mg total) by mouth 2 (two) times daily.  180 tablet  3  . ergocalciferol (DRISDOL) 50000 UNITS capsule Take 1 capsule (50,000 Units total) by  mouth once a week.  12 capsule  0  . folic acid (FOLVITE) 1 MG tablet Take 1 mg by mouth daily.        . furosemide (LASIX) 20 MG tablet TAKE ONE TABLET BY MOUTH 2 TIMES A DAY  180 tablet  1  . hydroxychloroquine (PLAQUENIL) 200 MG tablet Take 200 mg by mouth daily.      . Lactobacillus-Inulin ([place]) CAPS Take 1 capsule by mouth daily.  30 capsule  1  . lisinopril (PRINIVIL,ZESTRIL) 10 MG tablet Take 1 tablet (10 mg total) by mouth daily.  90 tablet  3  . methotrexate (50 MG/ML) 1 G injection Inject 1 mg into the vein once a week.      . Quercetin (QUERCITIN) POWD 250 mg by Does not apply route daily.        Marland Kitchen spironolactone (ALDACTONE) 25 MG tablet Take 1 tablet (25 mg total) by mouth daily.  90 tablet  3  . tamsulosin (FLOMAX) 0.4 MG CAPS capsule TAKE ONE CAPSULE BY MOUTH DAILY  30 capsule  5  . vitamin E 400 UNIT capsule Take 400 Units by mouth daily.         No current facility-administered medications on file prior to visit.     Past Medical History  Diagnosis Date  . Cardiomyopathy   . Diverticulosis of colon (without mention of hemorrhage) April 2012    occurred after colonoscopy, [place]  . Menopause     secondary to chemo for BRCA  . Rheumatoid arthritis(714.0)  on Plaquenil methotrxate Precious Reel)  . Breast cancer 1997    s/p mastectomy, left breast   . Cardiomyopathy due to chemotherapy     precipitated by use of Enbrel  2010  . Chronic systolic congestive heart failure      Past Surgical History  Procedure Laterality Date  . Rheumatology histroy    . Mastectomy  1997  . Foot surgery  2004  . 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 Cardiac catheterization  2010    normal coronary arteries     Family History  Problem Relation Age of Onset  . Heart disease Father   . Cancer Paternal Aunt     ovarian  . Heart disease Maternal Grandmother      History   Social History  . Marital Status: Married    Spouse  Name: N/A    Number of Children: N/A  . Years of Education: N/A   Occupational History  . dental hygienist     full time   Social History Main Topics  . Smoking status: Never Smoker   . Smokeless tobacco: Not on file     Comment: tobacco use- no  . Alcohol Use: No  . Drug Use: No  . Sexual Activity: Not on file   Other Topics Concern  . Not on file   Social History Narrative   Lives with spouse.     PHYSICAL EXAM   BP 88/62  Pulse 70  Ht $R'5\' 4"'gm$  (1.626 m)  Wt 150 lb 12 oz (68.38 kg)  BMI 25.86 kg/m2  Constitutional: She is oriented to person, place, and time. She appears well-developed and well-nourished. No distress.  HENT: No nasal discharge.  Head: Normocephalic and atraumatic.  Eyes: Pupils are equal and round. Right eye exhibits no discharge. Left eye exhibits no discharge.  Neck: Normal range of motion. Neck supple. No JVD present. No thyromegaly present.  Cardiovascular: Normal rate, regular rhythm, normal heart sounds. Exam reveals no gallop and no friction rub. No murmur heard.  Pulmonary/Chest: Effort normal and breath sounds normal. No stridor. No respiratory distress. She has no wheezes. She has no rales. She exhibits no tenderness.  Abdominal: Soft. Bowel sounds are normal. She exhibits no distension. There is no tenderness. There is no rebound and no guarding.  Musculoskeletal: Normal range of motion. She exhibits +1 edema and no tenderness.  The edema is worse on the right side.  Neurological: She is alert and oriented to person, place, and time. Coordination normal.  Skin: Skin is warm and dry. No rash noted. She is not diaphoretic. No erythema. No pallor.  Psychiatric: She has a normal mood and affect. Her behavior is normal. Judgment and thought content normal.   EKG: Sinus  Rhythm  Low voltage - ABNORMAL    ASSESSMENT AND PLAN

## 2014-04-09 ENCOUNTER — Other Ambulatory Visit: Payer: Self-pay | Admitting: Internal Medicine

## 2014-04-15 ENCOUNTER — Ambulatory Visit: Payer: Self-pay | Admitting: Internal Medicine

## 2014-04-15 ENCOUNTER — Encounter: Payer: Self-pay | Admitting: *Deleted

## 2014-04-15 LAB — HM MAMMOGRAPHY: HM Mammogram: NEGATIVE

## 2014-04-26 ENCOUNTER — Encounter: Payer: Self-pay | Admitting: Internal Medicine

## 2014-05-30 ENCOUNTER — Other Ambulatory Visit: Payer: Self-pay | Admitting: Internal Medicine

## 2014-06-20 ENCOUNTER — Other Ambulatory Visit: Payer: Self-pay | Admitting: Cardiovascular Disease

## 2014-07-08 ENCOUNTER — Other Ambulatory Visit: Payer: Self-pay | Admitting: Cardiovascular Disease

## 2014-08-29 ENCOUNTER — Encounter: Payer: Self-pay | Admitting: *Deleted

## 2014-09-25 ENCOUNTER — Telehealth: Payer: Self-pay

## 2014-09-25 MED ORDER — LEVOFLOXACIN 500 MG PO TABS: 500.0000 mg | ORAL_TABLET | Freq: Every day | ORAL | Status: DC

## 2014-09-25 MED ORDER — ONDANSETRON HCL 8 MG PO TABS: 8.0000 mg | ORAL_TABLET | Freq: Three times a day (TID) | ORAL | Status: DC | PRN

## 2014-09-25 NOTE — Telephone Encounter (Signed)
The patient stated she saw Dr.Tullo last night (1/26) and Dr.Tullo agreed to call her in an antibiotics and zofran for an upcoming trip.  The patient was checking to see when this would be done.  Thanks!

## 2014-09-25 NOTE — Telephone Encounter (Signed)
Done. levaquin and zofran setn,  Please take a probiotic ( Align, Floraque or Culturelle) whenever you are on antibiotic for 3 weeks to prevent a serious antibiotic associated diarrhea  Called clostirudium dificile colitis and a vaginal yeast infection

## 2014-09-25 NOTE — Telephone Encounter (Signed)
Left message for pt to return my call and sent mychart message. 

## 2014-10-10 ENCOUNTER — Other Ambulatory Visit: Payer: Self-pay | Admitting: Internal Medicine

## 2014-10-15 ENCOUNTER — Encounter: Payer: Self-pay | Admitting: Cardiovascular Disease

## 2014-10-15 ENCOUNTER — Ambulatory Visit (INDEPENDENT_AMBULATORY_CARE_PROVIDER_SITE_OTHER): Payer: 59 | Admitting: Cardiovascular Disease

## 2014-10-15 VITALS — BP 92/70 | HR 74 | Ht 64.0 in | Wt 150.5 lb

## 2014-10-15 DIAGNOSIS — I5022 Chronic systolic (congestive) heart failure: Secondary | ICD-10-CM

## 2014-10-15 NOTE — Assessment & Plan Note (Signed)
She is doing very well and appears to be euvolemic. She is currently New York Heart Association class II. She is on optimal medical therapy. Most recent ejection fraction by cardiac MRI was 37%. Continue medical therapy. I discussed the option of switching lisinopril to [facility] . However, she has chronic hypotension which would make the transition more difficult. Thus, I made no changes today.

## 2014-10-15 NOTE — Progress Notes (Signed)
HPI  Ms. Ketchem is a pleasant 64 year old woman with a history of nonischemic cardiomyopathy, normal coronary arteries by catheterization in 2010, history of rheumatoid arthritis, breast cancer, status post mastectomy and chemotherapy in 1997 with no radiation therapy with ejection fraction noted to be 30% in 2005. MUGA scan in 1997 was normal. She presents for routine follow up. Her cardiomyopathy was felt to be due to previous chemotherapy. She had an episode of heart failure in 2010 which was likely triggered by the use of Enbrel for rheumatoid arthritis.  Her initial ejection fraction was 25-30% which improved to 35-40% in 2011after starting Coreg and Lisinopril. An echocardiogram in 2013 showed  worsening in her left ventricular function. The left ventricle was more dilated with an ejection fraction of 25%. I started her on spironolactone 25 mg once daily. Hydroxychloroquine was discontinued due to concerns of possible cardiotoxicity. She had an emergency room visit for worsening dyspnea and elevated BNP. She was started on furosemide at that time and has been on it.  A cardiac MRI was performed which showed moderately dilated left ventricle with an ejection fraction of 37%. She is doing very well and denies any chest pain or significant dyspnea. No orthopnea, PND or lower extremity edema. Recent labs showed normal renal function and electrolytes. Hydroxychloroquine was resumed  with no evidence of worsening heart failure. Blood pressure tends to run low but she is asymptomatic.   Allergies  Allergen Reactions  . Amoxicillin     REACTION: upset stomach  . Sulfasalazine     Other reaction(s): Other (See Comments) GI upset     Current Outpatient Prescriptions on File Prior to Visit  Medication Sig Dispense Refill  . Calcium 150 MG TABS Take by mouth daily.      . carvedilol (COREG) 6.25 MG tablet TAKE ONE TABLET BY MOUTH 2 TIMES A DAY 180 tablet 3  . ergocalciferol (DRISDOL) 50000  UNITS capsule Take 1 capsule (50,000 Units total) by mouth once a week. 12 capsule 0  . folic acid (FOLVITE) 1 MG tablet Take 1 mg by mouth daily.      . furosemide (LASIX) 20 MG tablet TAKE ONE TABLET BY MOUTH 2 TIMES A DAY 180 tablet 1  . hydroxychloroquine (PLAQUENIL) 200 MG tablet Take 200 mg by mouth daily.    . Lactobacillus-Inulin ([place]) CAPS Take 1 capsule by mouth daily. 30 capsule 1  . lisinopril (PRINIVIL,ZESTRIL) 10 MG tablet TAKE ONE TABLET BY MOUTH DAILY 90 tablet 3  . methotrexate (50 MG/ML) 1 G injection Inject 1 mg into the vein once a week.    . spironolactone (ALDACTONE) 25 MG tablet Take 1 tablet (25 mg total) by mouth daily. 90 tablet 3  . tamsulosin (FLOMAX) 0.4 MG CAPS capsule TAKE ONE CAPSULE BY MOUTH DAILY 30 capsule 5  . vitamin E 400 UNIT capsule Take 400 Units by mouth daily.       No current facility-administered medications on file prior to visit.     Past Medical History  Diagnosis Date  . Cardiomyopathy   . Diverticulosis of colon (without mention of hemorrhage) April 2012    occurred after colonoscopy, [place]  . Menopause     secondary to chemo for BRCA  . Rheumatoid arthritis(714.0)     on Plaquenil methotrxate Precious Reel)  . Breast cancer 1997    s/p mastectomy, left breast   . Cardiomyopathy due to chemotherapy     precipitated by use of Enbrel  2010  . Chronic systolic congestive heart failure      Past Surgical History  Procedure Laterality Date  . Rheumatology histroy    . Mastectomy  1997  . Foot surgery  2004  . 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 Cardiac catheterization  2010    normal coronary arteries     Family History  Problem Relation Age of Onset  . Heart disease Father   . Cancer Paternal Aunt     ovarian  . Heart disease Maternal Grandmother      History   Social History  . Marital Status: Married    Spouse Name: N/A  . Number of Children: N/A  . Years of  Education: N/A   Occupational History  . dental hygienist     full time   Social History Main Topics  . Smoking status: Never Smoker   . Smokeless tobacco: Not on file     Comment: tobacco use- no  . Alcohol Use: No  . Drug Use: No  . Sexual Activity: Not on file   Other Topics Concern  . Not on file   Social History Narrative   Lives with spouse.     PHYSICAL EXAM   BP 92/70 mmHg  Pulse 74  Ht $R'5\' 4"'FZ$  (1.626 m)  Wt 150 lb 8 oz (68.266 kg)  BMI 25.82 kg/m2  Constitutional: She is oriented to person, place, and time. She appears well-developed and well-nourished. No distress.  HENT: No nasal discharge.  Head: Normocephalic and atraumatic.  Eyes: Pupils are equal and round. Right eye exhibits no discharge. Left eye exhibits no discharge.  Neck: Normal range of motion. Neck supple. No JVD present. No thyromegaly present.  Cardiovascular: Normal rate, regular rhythm, normal heart sounds. Exam reveals no gallop and no friction rub. No murmur heard.  Pulmonary/Chest: Effort normal and breath sounds normal. No stridor. No respiratory distress. She has no wheezes. She has no rales. She exhibits no tenderness.  Abdominal: Soft. Bowel sounds are normal. She exhibits no distension. There is no tenderness. There is no rebound and no guarding.  Musculoskeletal: Normal range of motion. She exhibits +1 edema and no tenderness.  The edema is worse on the right side.  Neurological: She is alert and oriented to person, place, and time. Coordination normal.  Skin: Skin is warm and dry. No rash noted. She is not diaphoretic. No erythema. No pallor.  Psychiatric: She has a normal mood and affect. Her behavior is normal. Judgment and thought content normal.      ASSESSMENT AND PLAN

## 2014-10-15 NOTE — Patient Instructions (Signed)
Continue same medications.   Your physician wants you to follow-up in: 6 months.  You will receive a reminder letter in the mail two months in advance. If you don't receive a letter, please call our office to schedule the follow-up appointment.  

## 2014-12-10 ENCOUNTER — Other Ambulatory Visit: Payer: Self-pay | Admitting: Internal Medicine

## 2015-01-30 ENCOUNTER — Telehealth: Payer: Self-pay | Admitting: *Deleted

## 2015-01-30 DIAGNOSIS — I509 Heart failure, unspecified: Secondary | ICD-10-CM

## 2015-01-30 NOTE — Telephone Encounter (Signed)
S/w pt who indicates she is having some difficulty breathing/short of breath with exertion in the afternoons worsening symptoms past couple of days States her ankles/feet are always swollen, elevates and sees improvement Indicates her stomach is swollen tight States she weighs herself daily with no weight gain Denies any dietary changes  Per Dr. Fletcher Anon, have pt come in for labs 6/3 BMET, BNP  Pt agrees to lab time and date: June 3, 8:45am

## 2015-01-30 NOTE — Telephone Encounter (Signed)
Pt is at work and is having issues of SOB Pt c/o Shortness Of Breath: STAT if SOB developed within the last 24 hours or pt is noticeably SOB on the phone  1. Are you currently SOB (can you hear that pt is SOB on the phone)? No   2. How long have you been experiencing SOB? At least 2 weeks. Noticed yesterday a lot, the went from car to house and the heat mixed provably did it but not sure.   3. Are you SOB when sitting or when up moving around? Walking a short distance   4. Are you currently experiencing any other symptoms? Sometimes at night when she goes to bed, has a slight tightness in her chest. Also about 2w on that.  Please advise.

## 2015-01-31 ENCOUNTER — Other Ambulatory Visit (INDEPENDENT_AMBULATORY_CARE_PROVIDER_SITE_OTHER): Payer: 59

## 2015-01-31 DIAGNOSIS — I509 Heart failure, unspecified: Secondary | ICD-10-CM | POA: Diagnosis not present

## 2015-02-01 LAB — BASIC METABOLIC PANEL
BUN/Creatinine Ratio: 18 (ref 11–26)
BUN: 13 mg/dL (ref 8–27)
CO2: 25 mmol/L (ref 18–29)
Calcium: 9.2 mg/dL (ref 8.7–10.3)
Chloride: 98 mmol/L (ref 97–108)
Creatinine, Ser: 0.72 mg/dL (ref 0.57–1.00)
GFR calc Af Amer: 105 mL/min/{1.73_m2} (ref >59)
GFR calc non Af Amer: 91 mL/min/{1.73_m2} (ref >59)
Glucose: 141 mg/dL — ABNORMAL HIGH (ref 65–99)
Potassium: 4.4 mmol/L (ref 3.5–5.2)
Sodium: 137 mmol/L (ref 134–144)

## 2015-02-01 LAB — BRAIN NATRIURETIC PEPTIDE: BNP: 20.2 pg/mL (ref 0.0–100.0)

## 2015-02-03 ENCOUNTER — Telehealth: Payer: Self-pay

## 2015-02-03 ENCOUNTER — Telehealth: Payer: Self-pay | Admitting: Cardiovascular Disease

## 2015-02-03 NOTE — Telephone Encounter (Signed)
-----   Message from Wellington Hampshire, MD sent at 02/03/2015  7:54 AM EDT ----- Inform patient that labs were normal.

## 2015-02-03 NOTE — Telephone Encounter (Signed)
Patient returning call from Sharon.   Please call back.   °

## 2015-02-04 ENCOUNTER — Ambulatory Visit (INDEPENDENT_AMBULATORY_CARE_PROVIDER_SITE_OTHER): Payer: 59 | Admitting: Cardiovascular Disease

## 2015-02-04 ENCOUNTER — Encounter: Payer: Self-pay | Admitting: Cardiovascular Disease

## 2015-02-04 ENCOUNTER — Telehealth: Payer: Self-pay

## 2015-02-04 VITALS — BP 118/72 | HR 83 | Ht 64.0 in | Wt 147.0 lb

## 2015-02-04 DIAGNOSIS — R0602 Shortness of breath: Secondary | ICD-10-CM | POA: Diagnosis not present

## 2015-02-04 DIAGNOSIS — R0789 Other chest pain: Secondary | ICD-10-CM | POA: Diagnosis not present

## 2015-02-04 DIAGNOSIS — I5022 Chronic systolic (congestive) heart failure: Secondary | ICD-10-CM | POA: Diagnosis not present

## 2015-02-04 NOTE — Progress Notes (Signed)
HPI  Helen Parks is a pleasant 64 year old woman with a history of nonischemic cardiomyopathy, normal coronary arteries by catheterization in 2010, history of rheumatoid arthritis, breast cancer, status post mastectomy and chemotherapy in 1997 with no radiation therapy with ejection fraction noted to be 30% in 2005. MUGA scan in 1997 was normal. She presents for routine follow up. Her cardiomyopathy was felt to be due to previous chemotherapy. She had an episode of heart failure in 2010 which was likely triggered by the use of Enbrel for rheumatoid arthritis.  Her initial ejection fraction was 25-30% which improved to 35-40% in 2011after starting Coreg and Lisinopril. An echocardiogram in 2013 showed  worsening in her left ventricular function. The left ventricle was more dilated with an ejection fraction of 25%. I started her on spironolactone 25 mg once daily. Hydroxychloroquine was discontinued due to concerns of possible cardiotoxicity. She had an emergency room visit for worsening dyspnea and elevated BNP. She was started on furosemide at that time and has been on it.  A cardiac MRI was performed which showed moderately dilated left ventricle with an ejection fraction of 37%. She has been under significant stress lately due to the unexpected death of her 48 year old brother in law who died from complications related to cholecystitis. Over the last week, she has noticed significant worsening of exertional dyspnea which is now happening at rest and is associated with substernal chest tightness. She feels like there is a band around her chest. There has been no worsening leg edema. She denies any orthopnea although the chest tightness gets worse after lying down. Performed labs showed normal basic metabolic profile and BNP.   Allergies  Allergen Reactions  . Amoxicillin     REACTION: upset stomach  . Sulfasalazine     Other reaction(s): Other (See Comments) GI upset     Current Outpatient  Prescriptions on File Prior to Visit  Medication Sig Dispense Refill  . Calcium 150 MG TABS Take by mouth daily.      . carvedilol (COREG) 6.25 MG tablet TAKE ONE TABLET BY MOUTH 2 TIMES A DAY 917 tablet 3  . folic acid (FOLVITE) 1 MG tablet Take 1 mg by mouth daily.      . furosemide (LASIX) 20 MG tablet TAKE ONE TABLET BY MOUTH 2 TIMES A DAY 180 tablet 1  . hydroxychloroquine (PLAQUENIL) 200 MG tablet Take 200 mg by mouth daily.    . Lactobacillus-Inulin ([place]) CAPS Take 1 capsule by mouth daily. 30 capsule 1  . lisinopril (PRINIVIL,ZESTRIL) 10 MG tablet TAKE ONE TABLET BY MOUTH DAILY 90 tablet 3  . methotrexate (50 MG/ML) 1 G injection Inject 1 mg into the vein once a week.    . spironolactone (ALDACTONE) 25 MG tablet Take 1 tablet (25 mg total) by mouth daily. 90 tablet 3  . tamsulosin (FLOMAX) 0.4 MG CAPS capsule TAKE ONE CAPSULE BY MOUTH DAILY 30 capsule 5  . vitamin E 400 UNIT capsule Take 400 Units by mouth daily.       No current facility-administered medications on file prior to visit.     Past Medical History  Diagnosis Date  . Cardiomyopathy   . Diverticulosis of colon (without mention of hemorrhage) April 2012    occurred after colonoscopy, [place]  . Menopause     secondary to chemo for BRCA  . Rheumatoid arthritis(714.0)     on Plaquenil methotrxate [facility])  . Breast cancer 1997    s/p  mastectomy, left breast   . Cardiomyopathy due to chemotherapy     precipitated by use of Enbrel  2010  . Chronic systolic congestive heart failure      Past Surgical History  Procedure Laterality Date  . Rheumatology histroy    . Mastectomy  1997  . Foot surgery  2004  . 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 Cardiac catheterization  2010    normal coronary arteries     Family History  Problem Relation Age of Onset  . Heart disease Father   . Cancer Paternal Aunt     ovarian  . Heart disease Maternal Grandmother       History   Social History  . Marital Status: Married    Spouse Name: N/A  . Number of Children: N/A  . Years of Education: N/A   Occupational History  . dental hygienist     full time   Social History Main Topics  . Smoking status: Never Smoker   . Smokeless tobacco: Not on file     Comment: tobacco use- no  . Alcohol Use: No  . Drug Use: No  . Sexual Activity: Not on file   Other Topics Concern  . Not on file   Social History Narrative   Lives with spouse.     PHYSICAL EXAM   BP 118/72 mmHg  Pulse 83  Ht 5' 4" (1.626 m)  Wt 147 lb (66.679 kg)  BMI 25.22 kg/m2  Constitutional: She is oriented to person, place, and time. She appears well-developed and well-nourished. No distress.  HENT: No nasal discharge.  Head: Normocephalic and atraumatic.  Eyes: Pupils are equal and round. Right eye exhibits no discharge. Left eye exhibits no discharge.  Neck: Normal range of motion. Neck supple. No JVD present. No thyromegaly present.  Cardiovascular: Normal rate, regular rhythm, normal heart sounds. Exam reveals no gallop and no friction rub. No murmur heard.  Pulmonary/Chest: Effort normal and breath sounds normal. No stridor. No respiratory distress. She has no wheezes. She has no rales. She exhibits no tenderness.  Abdominal: Soft. Bowel sounds are normal. She exhibits no distension. There is no tenderness. There is no rebound and no guarding.  Musculoskeletal: Normal range of motion. She exhibits trace edema and no tenderness.  The edema is worse on the right side.  Neurological: She is alert and oriented to person, place, and time. Coordination normal.  Skin: Skin is warm and dry. No rash noted. She is not diaphoretic. No erythema. No pallor.  Psychiatric: She has a normal mood and affect. Her behavior is normal. Judgment and thought content normal.   EKG: Sinus  Rhythm  - occasional ectopic ventricular beat    Low voltage -possible pulmonary disease.   ABNORMAL     ASSESSMENT AND PLAN

## 2015-02-04 NOTE — Patient Instructions (Signed)
Medication Instructions:  Your physician has recommended you make the following change in your medication:  For the next three days: Take 40mg  lasix in the morning and 20mg  lasix in the evening. After three days, resume your normal schedule of 20mg  lasix twice per day.   Labwork: none  Testing/Procedures: Your physician has requested that you have an echocardiogram. Echocardiography is a painless test that uses sound waves to create images of your heart. It provides your doctor with information about the size and shape of your heart and how well your heart's chambers and valves are working. This procedure takes approximately one hour. There are no restrictions for this procedure.    Follow-Up: Your physician recommends that you keep your follow-up appointment with Dr. Fletcher Anon.    Any Other Special Instructions Will Be Listed Below (If Applicable).

## 2015-02-04 NOTE — Assessment & Plan Note (Signed)
The patient has significant worsening of exertional dyspnea. I do see clear evidence of fluid overload by physical exam. If anything, her weight has actually went down and few pounds and lower extremity edema has improved. Recent BNP was normal. The symptoms in the past where associated with worsening LV systolic function and there is a possibility of low cardiac output. Thus, I requested an echocardiogram. Most recent echocardiogram was in 2014. If LV systolic function is worse than before, I might consider adding digoxin. We should also consider stopping Plaquenil which was a remote possibility for worsening LV systolic function. Switching from lisinopril to [facility] should also be a consideration. I elected to increase her Lasix to 40 mg in the morning and 20 mg afternoon for only 3 days and then go back on her regular dose to see if there is symptomatic improvement. Recent stress might be contributing to some of her symptoms.

## 2015-02-04 NOTE — Telephone Encounter (Signed)
l mom to schedule appt with dr Fletcher Anon for SOB

## 2015-02-13 ENCOUNTER — Other Ambulatory Visit: Payer: Self-pay | Admitting: Cardiovascular Disease

## 2015-02-14 ENCOUNTER — Other Ambulatory Visit: Payer: Self-pay

## 2015-02-14 ENCOUNTER — Ambulatory Visit (INDEPENDENT_AMBULATORY_CARE_PROVIDER_SITE_OTHER): Payer: 59

## 2015-02-14 DIAGNOSIS — R0602 Shortness of breath: Secondary | ICD-10-CM | POA: Diagnosis not present

## 2015-02-19 ENCOUNTER — Telehealth: Payer: Self-pay

## 2015-02-19 NOTE — Telephone Encounter (Signed)
S/w patient and reviewed echo results. Pt verbalized understanding with no further questions.

## 2015-02-25 ENCOUNTER — Ambulatory Visit (INDEPENDENT_AMBULATORY_CARE_PROVIDER_SITE_OTHER): Payer: 59 | Admitting: Internal Medicine

## 2015-02-25 ENCOUNTER — Encounter: Payer: Self-pay | Admitting: Internal Medicine

## 2015-02-25 VITALS — BP 100/60 | HR 81 | Temp 98.2°F | Resp 14 | Ht 62.5 in | Wt 150.0 lb

## 2015-02-25 DIAGNOSIS — E785 Hyperlipidemia, unspecified: Secondary | ICD-10-CM | POA: Diagnosis not present

## 2015-02-25 DIAGNOSIS — Z0000 Encounter for general adult medical examination without abnormal findings: Secondary | ICD-10-CM | POA: Diagnosis not present

## 2015-02-25 DIAGNOSIS — R5383 Other fatigue: Secondary | ICD-10-CM | POA: Diagnosis not present

## 2015-02-25 DIAGNOSIS — R140 Abdominal distension (gaseous): Secondary | ICD-10-CM

## 2015-02-25 DIAGNOSIS — Z1239 Encounter for other screening for malignant neoplasm of breast: Secondary | ICD-10-CM

## 2015-02-25 NOTE — Progress Notes (Signed)
Patient ID: Helen Parks, female    DOB: [birth year]  Age: 64 y.o. MRN: [id number]  The patient is here for annual  wellness examination and management of other chronic and acute problems.  HER Screening left sided mammogram due August ., she is s/p right mastectomy  PAP normal last year  Cardiology eval recently for chest pain and dyspnea  see below    The risk factors are reflected in the social history.  The roster of all physicians providing medical care to patient - is listed in the Snapshot section of the chart.  Activities of daily living:  The patient is 100% independent in all ADLs: dressing, toileting, feeding as well as independent mobility  Home safety : The patient has smoke detectors in the home. They wear seatbelts.  There are no firearms at home. There is no violence in the home.   There is no risks for hepatitis, STDs or HIV. There is no   history of blood transfusion. They have no travel history to infectious disease endemic areas of the world.  The patient has seen their dentist in the last six month. They have seen their eye doctor in the last year. They admit to slight hearing difficulty with regard to whispered voices and some television programs.  They have deferred audiologic testing in the last year.  They do not  have excessive sun exposure. Discussed the need for sun protection: hats, long sleeves and use of sunscreen if there is significant sun exposure.   Diet: the importance of a healthy diet is discussed. They do have a healthy diet.  The benefits of regular aerobic exercise were discussed. She walks 4 times per week ,  20 minutes.   Depression screen: there are no signs or vegative symptoms of depression- irritability, change in appetite, anhedonia, sadness/tearfullness.  Cognitive assessment: the patient manages all their financial and personal affairs and is actively engaged. They could relate day,date,year and events; recalled 2/3 objects at 3 minutes;  performed clock-face test normally.  The following portions of the patient's history were reviewed and updated as appropriate: allergies, current medications, past family history, past medical history,  past surgical history, past social history  and problem list.  Visual acuity was not assessed per patient preference since she has regular follow up with her ophthalmologist. Hearing and body mass index were assessed and reviewed.   During the course of the visit the patient was educated and counseled about appropriate screening and preventive services including : fall prevention , diabetes screening, nutrition counseling, colorectal cancer screening, and recommended immunizations.    CC: The primary encounter diagnosis was Hyperlipidemia. Diagnoses of Other fatigue, Postprandial bloating, Breast cancer screening, Visit for preventive health examination, and Postprandial abdominal bloating were also pertinent to this visit. recent episode of chest tightness and dyspnea,  ECHO done by Arida EF 40 to 45% diffuse hypokinesis   And transient increase in lasix to 60 mg twice daily ,  Now back down to  20 mg twice daily total on most days    Notes sob is worse after eating,  some bloating .  No prior eval for hiatal hernia   History Erisa has a past medical history of Cardiomyopathy; Diverticulosis of colon (without mention of hemorrhage) (April 2012); Menopause; Rheumatoid arthritis(714.0); Breast cancer (1997); Cardiomyopathy due to chemotherapy; and Chronic systolic congestive heart failure.   She has past surgical history that includes rheumatology histroy; Mastectomy (1997); Foot surgery (2004); Ankle Fusion; and Cardiac catheterization (2010).  Her family history includes Cancer in her paternal aunt; Heart disease in her father and maternal grandmother.She reports that she has never smoked. She does not have any smokeless tobacco history on file. She reports that she does not drink alcohol or use  illicit drugs.  Outpatient Prescriptions Prior to Visit  Medication Sig Dispense Refill  . Calcium 150 MG TABS Take by mouth daily.      . carvedilol (COREG) 6.25 MG tablet TAKE ONE TABLET BY MOUTH 2 TIMES A DAY 180 tablet 3  . cholecalciferol (VITAMIN D) 1000 UNITS tablet Take 1,000 Units by mouth daily.    . folic acid (FOLVITE) 1 MG tablet Take 1 mg by mouth daily.      . furosemide (LASIX) 20 MG tablet TAKE ONE TABLET BY MOUTH 2 TIMES A DAY 180 tablet 1  . hydroxychloroquine (PLAQUENIL) 200 MG tablet Take 200 mg by mouth daily.    . Lactobacillus-Inulin ([place]) CAPS Take 1 capsule by mouth daily. 30 capsule 1  . lisinopril (PRINIVIL,ZESTRIL) 10 MG tablet TAKE ONE TABLET BY MOUTH DAILY 90 tablet 3  . methotrexate (50 MG/ML) 1 G injection Inject 1 mg into the vein once a week.    . spironolactone (ALDACTONE) 25 MG tablet TAKE 1 TABLET (25 MG TOTAL) BY MOUTH DAILY. 90 tablet 3  . tamsulosin (FLOMAX) 0.4 MG CAPS capsule TAKE ONE CAPSULE BY MOUTH DAILY 30 capsule 5  . vitamin E 400 UNIT capsule Take 400 Units by mouth daily.       No facility-administered medications prior to visit.    Review of Systems   Patient denies headache, fevers, malaise, unintentional weight loss, skin rash, eye pain, sinus congestion and sinus pain, sore throat, dysphagia,  hemoptysis , cough, dyspnea, wheezing, chest pain, palpitations, orthopnea, edema, abdominal pain, nausea, melena, diarrhea, constipation, flank pain, dysuria, hematuria, urinary  Frequency, nocturia, numbness, tingling, seizures,  Focal weakness, Loss of consciousness,  Tremor, insomnia, depression, anxiety, and suicidal ideation.      Objective:  BP 100/60 mmHg  Pulse 81  Temp(Src) 98.2 F (36.8 C) (Oral)  Resp 14  Ht 5' 2.5" (1.588 m)  Wt 150 lb (68.04 kg)  BMI 26.98 kg/m2  SpO2 97%  Physical Exam   General appearance: alert, cooperative and appears stated age Head: Normocephalic, without obvious  abnormality, atraumatic Eyes: conjunctivae/corneas clear. PERRL, EOM's intact. Fundi benign. Ears: normal TM's and external ear canals both ears Nose: Nares normal. Septum midline. Mucosa normal. No drainage or sinus tenderness. Throat: lips, mucosa, and tongue normal; teeth and gums normal Neck: no adenopathy, no carotid bruit, no JVD, supple, symmetrical, trachea midline and thyroid not enlarged, symmetric, no tenderness/mass/nodules Lungs: clear to auscultation bilaterally Breasts: right mastectomy, left breast  normal appearance, no masses or tenderness Heart: regular rate and rhythm, S1, S2 normal, no murmur, click, rub or gallop Abdomen: soft, non-tender; bowel sounds normal; no masses,  no organomegaly Extremities: extremities normal, atraumatic, no cyanosis or edema Pulses: 2+ and symmetric Skin: Skin color, texture, turgor normal. No rashes or lesions Neurologic: Alert and oriented X 3, normal strength and tone. Normal symmetric reflexes. Normal coordination and gait.     Assessment & Plan:   Problem List Items Addressed This Visit      Unprioritized   Visit for preventive health examination    Annual wellness  exam was done as well as a comprehensive physical exam  .  During the course of the visit the patient was educated and counseled  about appropriate screening and preventive services and screenings were brought up to date for cervical and breast cancer .  She will return for fasting labs to provide samples for diabetes screening and lipid analysis with projected  10 year  risk for CAD. nutrition counseling, skin cancer screening has been recommended, along with review of the age appropriate recommended immunizations.  Printed recommendations for health maintenance screenings was given.        Postprandial abdominal bloating    With shortness of breath .  Will rule out hiatal hernia as major contributor       Other Visit Diagnoses    Hyperlipidemia    -  Primary     Relevant Orders    Lipid panel (Completed)    Other fatigue        Relevant Orders    TSH (Completed)    Postprandial bloating        Relevant Orders    DG Esophagus    Breast cancer screening        Relevant Orders    MM Digital Screening Unilat L       I am having Ms. Cavey maintain her Calcium, folic acid, vitamin E, [facility], methotrexate, hydroxychloroquine, lisinopril, carvedilol, furosemide, tamsulosin, cholecalciferol, and spironolactone.  No orders of the defined types were placed in this encounter.    There are no discontinued medications.  Follow-up: No Follow-up on file.   Crecencio Mc, MD

## 2015-02-25 NOTE — Patient Instructions (Signed)
Mammogram to be ordered in late August  Upper GI study to rule ou t a hiatal hernia as the cause of your dyspnea after eating   You can check with [employer] about status of Pneumovax vaccine   recommend getting the majority of your calcium and Vitamin D  through diet rather than supplements given the recent association of calcium supplements with increased coronary artery calcium scores  Try the almond and cashew milks that most grocery stores  now carry  in the dairy  Section>   They are lactose free:  [employer] makes ab  Monica Martinez,  Original formula.  Delicious,  Low carb,  Low cal,  Cholesterol free   BJs carriHealth Maintenance Adopting a healthy lifestyle and getting preventive care can go a long way to promote health and wellness. Talk with your health care provider about what schedule of regular examinations is right for you. This is a good chance for you to check in with your provider about disease prevention and staying healthy. In between checkups, there are plenty of things you can do on your own. Experts have done a lot of research about which lifestyle changes and preventive measures are most likely to keep you healthy. Ask your health care provider for more information. WEIGHT AND DIET  Eat a healthy diet  Be sure to include plenty of vegetables, fruits, low-fat dairy products, and lean protein.  Do not eat a lot of foods high in solid fats, added sugars, or salt.  Get regular exercise. This is one of the most important things you can do for your health.  Most adults should exercise for at least 150 minutes each week. The exercise should increase your heart rate and make you sweat (moderate-intensity exercise).  Most adults should also do strengthening exercises at least twice a week. This is in addition to the moderate-intensity exercise.  Maintain a healthy weight  Body mass index (BMI) is a measurement that can be used to identify possible weight problems. It  estimates body fat based on height and weight. Your health care provider can help determine your BMI and help you achieve or maintain a healthy weight.  For females 78 years of age and older:   A BMI below 18.5 is considered underweight.  A BMI of 18.5 to 24.9 is normal.  A BMI of 25 to 29.9 is considered overweight.  A BMI of 30 and above is considered obese.  Watch levels of cholesterol and blood lipids  You should start having your blood tested for lipids and cholesterol at 62 years of age, then have this test every 5 years.  You may need to have your cholesterol levels checked more often if:  Your lipid or cholesterol levels are high.  You are older than 62 years of age.  You are at high risk for heart disease.  CANCER SCREENING   Lung Cancer  Lung cancer screening is recommended for adults 21-35 years old who are at high risk for lung cancer because of a history of smoking.  A yearly low-dose CT scan of the lungs is recommended for people who:  Currently smoke.  Have quit within the past 15 years.  Have at least a 30-pack-year history of smoking. A pack year is smoking an average of one pack of cigarettes a day for 1 year.  Yearly screening should continue until it has been 15 years since you quit.  Yearly screening should stop if you develop a health problem that  would prevent you from having lung cancer treatment.  Breast Cancer  Practice breast self-awareness. This means understanding how your breasts normally appear and feel.  It also means doing regular breast self-exams. Let your health care provider know about any changes, no matter how small.  If you are in your 20s or 30s, you should have a clinical breast exam (CBE) by a health care provider every 1-3 years as part of a regular health exam.  If you are 18 or older, have a CBE every year. Also consider having a breast X-ray (mammogram) every year.  If you have a family history of breast cancer, talk  to your health care provider about genetic screening.  If you are at high risk for breast cancer, talk to your health care provider about having an MRI and a mammogram every year.  Breast cancer gene (BRCA) assessment is recommended for women who have family members with BRCA-related cancers. BRCA-related cancers include:  Breast.  Ovarian.  Tubal.  Peritoneal cancers.  Results of the assessment will determine the need for genetic counseling and BRCA1 and BRCA2 testing. Cervical Cancer Routine pelvic examinations to screen for cervical cancer are no longer recommended for nonpregnant women who are considered low risk for cancer of the pelvic organs (ovaries, uterus, and vagina) and who do not have symptoms. A pelvic examination may be necessary if you have symptoms including those associated with pelvic infections. Ask your health care provider if a screening pelvic exam is right for you.   The Pap test is the screening test for cervical cancer for women who are considered at risk.  If you had a hysterectomy for a problem that was not cancer or a condition that could lead to cancer, then you no longer need Pap tests.  If you are older than 65 years, and you have had normal Pap tests for the past 10 years, you no longer need to have Pap tests.  If you have had past treatment for cervical cancer or a condition that could lead to cancer, you need Pap tests and screening for cancer for at least 20 years after your treatment.  If you no longer get a Pap test, assess your risk factors if they change (such as having a new sexual partner). This can affect whether you should start being screened again.  Some women have medical problems that increase their chance of getting cervical cancer. If this is the case for you, your health care provider may recommend more frequent screening and Pap tests.  The human papillomavirus (HPV) test is another test that may be used for cervical cancer screening.  The HPV test looks for the virus that can cause cell changes in the cervix. The cells collected during the Pap test can be tested for HPV.  The HPV test can be used to screen women 75 years of age and older. Getting tested for HPV can extend the interval between normal Pap tests from three to five years.  An HPV test also should be used to screen women of any age who have unclear Pap test results.  After 62 years of age, women should have HPV testing as often as Pap tests.  Colorectal Cancer  This type of cancer can be detected and often prevented.  Routine colorectal cancer screening usually begins at 62 years of age and continues through 62 years of age.  Your health care provider may recommend screening at an earlier age if you have risk factors for colon cancer.  Your health care provider may also recommend using home test kits to check for hidden blood in the stool.  A small camera at the end of a tube can be used to examine your colon directly (sigmoidoscopy or colonoscopy). This is done to check for the earliest forms of colorectal cancer.  Routine screening usually begins at age 66.  Direct examination of the colon should be repeated every 5-10 years through 62 years of age. However, you may need to be screened more often if early forms of precancerous polyps or small growths are found. Skin Cancer  Check your skin from head to toe regularly.  Tell your health care provider about any new moles or changes in moles, especially if there is a change in a mole's shape or color.  Also tell your health care provider if you have a mole that is larger than the size of a pencil eraser.  Always use sunscreen. Apply sunscreen liberally and repeatedly throughout the day.  Protect yourself by wearing long sleeves, pants, a wide-brimmed hat, and sunglasses whenever you are outside. HEART DISEASE, DIABETES, AND HIGH BLOOD PRESSURE   Have your blood pressure checked at least every 1-2  years. High blood pressure causes heart disease and increases the risk of stroke.  If you are between 56 years and 80 years old, ask your health care provider if you should take aspirin to prevent strokes.  Have regular diabetes screenings. This involves taking a blood sample to check your fasting blood sugar level.  If you are at a normal weight and have a low risk for diabetes, have this test once every three years after 62 years of age.  If you are overweight and have a high risk for diabetes, consider being tested at a younger age or more often. PREVENTING INFECTION  Hepatitis B  If you have a higher risk for hepatitis B, you should be screened for this virus. You are considered at high risk for hepatitis B if:  You were born in a country where hepatitis B is common. Ask your health care provider which countries are considered high risk.  Your parents were born in a high-risk country, and you have not been immunized against hepatitis B (hepatitis B vaccine).  You have HIV or AIDS.  You use needles to inject street drugs.  You live with someone who has hepatitis B.  You have had sex with someone who has hepatitis B.  You get hemodialysis treatment.  You take certain medicines for conditions, including cancer, organ transplantation, and autoimmune conditions. Hepatitis C  Blood testing is recommended for:  Everyone born from [birth year] through 1963.  Anyone with known risk factors for hepatitis C. Sexually transmitted infections (STIs)  You should be screened for sexually transmitted infections (STIs) including gonorrhea and chlamydia if:  You are sexually active and are younger than 62 years of age.  You are older than 62 years of age and your health care provider tells you that you are at risk for this type of infection.  Your sexual activity has changed since you were last screened and you are at an increased risk for chlamydia or gonorrhea. Ask your health care provider if  you are at risk.  If you do not have HIV, but are at risk, it may be recommended that you take a prescription medicine daily to prevent HIV infection. This is called pre-exposure prophylaxis (PrEP). You are considered at risk if:  You are sexually active and do not regularly use  condoms or know the HIV status of your partner(s).  You take drugs by injection.  You are sexually active with a partner who has HIV. Talk with your health care provider about whether you are at high risk of being infected with HIV. If you choose to begin PrEP, you should first be tested for HIV. You should then be tested every 3 months for as long as you are taking PrEP.  PREGNANCY   If you are premenopausal and you may become pregnant, ask your health care provider about preconception counseling.  If you may become pregnant, take 400 to 800 micrograms (mcg) of folic acid every day.  If you want to prevent pregnancy, talk to your health care provider about birth control (contraception). OSTEOPOROSIS AND MENOPAUSE   Osteoporosis is a disease in which the bones lose minerals and strength with aging. This can result in serious bone fractures. Your risk for osteoporosis can be identified using a bone density scan.  If you are 33 years of age or older, or if you are at risk for osteoporosis and fractures, ask your health care provider if you should be screened.  Ask your health care provider whether you should take a calcium or vitamin D supplement to lower your risk for osteoporosis.  Menopause may have certain physical symptoms and risks.  Hormone replacement therapy may reduce some of these symptoms and risks. Talk to your health care provider about whether hormone replacement therapy is right for you.  HOME CARE INSTRUCTIONS   Schedule regular health, dental, and eye exams.  Stay current with your immunizations.   Do not use any tobacco products including cigarettes, chewing tobacco, or electronic  cigarettes.  If you are pregnant, do not drink alcohol.  If you are breastfeeding, limit how much and how often you drink alcohol.  Limit alcohol intake to no more than 1 drink per day for nonpregnant women. One drink equals 12 ounces of beer, 5 ounces of wine, or 1 ounces of hard liquor.  Do not use street drugs.  Do not share needles.  Ask your health care provider for help if you need support or information about quitting drugs.  Tell your health care provider if you often feel depressed.  Tell your health care provider if you have ever been abused or do not feel safe at home. Document Released: 03/01/2011 Document Revised: 12/31/2013 Document Reviewed: 07/18/2013 [facility] Patient Information 2015 [place], [place]. This information is not intended to replace advice given to you by your health care provider. Make sure you discuss any questions you have with your health care provider. es a "toasted coconut/almond" blend that's alos delicious   Fasting lipids today

## 2015-02-26 LAB — LIPID PANEL
Cholesterol: 161 mg/dL (ref 0–200)
HDL: 58.4 mg/dL (ref >39.00)
LDL Cholesterol: 86 mg/dL (ref 0–99)
NonHDL: 102.6
Total CHOL/HDL Ratio: 3
Triglycerides: 83 mg/dL (ref 0.0–149.0)
VLDL: 16.6 mg/dL (ref 0.0–40.0)

## 2015-02-26 LAB — TSH: TSH: 1.7 u[IU]/mL (ref 0.35–4.50)

## 2015-02-27 ENCOUNTER — Encounter: Payer: Self-pay | Admitting: Internal Medicine

## 2015-02-27 DIAGNOSIS — K449 Diaphragmatic hernia without obstruction or gangrene: Secondary | ICD-10-CM | POA: Insufficient documentation

## 2015-02-27 DIAGNOSIS — Z0000 Encounter for general adult medical examination without abnormal findings: Secondary | ICD-10-CM | POA: Insufficient documentation

## 2015-02-27 NOTE — Assessment & Plan Note (Signed)

## 2015-02-27 NOTE — Assessment & Plan Note (Signed)
With shortness of breath .  Will rule out hiatal hernia as major contributor

## 2015-03-07 ENCOUNTER — Encounter: Payer: Self-pay | Admitting: Internal Medicine

## 2015-03-14 ENCOUNTER — Ambulatory Visit
Admission: RE | Admit: 2015-03-14 | Discharge: 2015-03-14 | Disposition: A | Discharge status: Home / Self Care | Payer: 59 | Source: Ambulatory Visit | Attending: Internal Medicine | Admitting: Internal Medicine

## 2015-03-14 DIAGNOSIS — K449 Diaphragmatic hernia without obstruction or gangrene: Secondary | ICD-10-CM | POA: Insufficient documentation

## 2015-03-14 DIAGNOSIS — R140 Abdominal distension (gaseous): Secondary | ICD-10-CM | POA: Diagnosis present

## 2015-03-15 ENCOUNTER — Encounter: Payer: Self-pay | Admitting: Internal Medicine

## 2015-04-02 ENCOUNTER — Encounter: Payer: Self-pay | Admitting: Internal Medicine

## 2015-04-14 ENCOUNTER — Other Ambulatory Visit: Payer: Self-pay | Admitting: Internal Medicine

## 2015-04-17 ENCOUNTER — Ambulatory Visit
Admission: RE | Admit: 2015-04-17 | Discharge: 2015-04-17 | Disposition: A | Discharge status: Home / Self Care | Payer: 59 | Source: Ambulatory Visit | Attending: Internal Medicine | Admitting: Internal Medicine

## 2015-04-17 ENCOUNTER — Other Ambulatory Visit: Payer: Self-pay | Admitting: Internal Medicine

## 2015-04-17 DIAGNOSIS — Z1231 Encounter for screening mammogram for malignant neoplasm of breast: Secondary | ICD-10-CM | POA: Insufficient documentation

## 2015-04-17 DIAGNOSIS — Z1239 Encounter for other screening for malignant neoplasm of breast: Secondary | ICD-10-CM

## 2015-04-20 ENCOUNTER — Encounter: Payer: Self-pay | Admitting: Internal Medicine

## 2015-05-08 ENCOUNTER — Ambulatory Visit: Payer: 59 | Admitting: Cardiovascular Disease

## 2015-05-16 ENCOUNTER — Encounter: Payer: Self-pay | Admitting: Cardiovascular Disease

## 2015-05-16 ENCOUNTER — Ambulatory Visit (INDEPENDENT_AMBULATORY_CARE_PROVIDER_SITE_OTHER): Payer: 59 | Admitting: Cardiovascular Disease

## 2015-05-16 VITALS — BP 98/60 | HR 81 | Ht 64.0 in | Wt 149.0 lb

## 2015-05-16 DIAGNOSIS — I5022 Chronic systolic (congestive) heart failure: Secondary | ICD-10-CM | POA: Diagnosis not present

## 2015-05-16 NOTE — Patient Instructions (Signed)
Medication Instructions: Continue same medications.   Labwork: None.   Procedures/Testing: None.   Follow-Up: 6 months with Dr. Arida.   Any Additional Special Instructions Will Be Listed Below (If Applicable).   

## 2015-05-16 NOTE — Assessment & Plan Note (Addendum)
She is doing very well and currently New York Heart Association class II. There is no evidence of fluid overload. Most recent ejection fraction was 40-45%. Labs in June were unremarkable and showed normal renal function and electrolytes. Continue current medical therapy. Leg edema seems to be due to chronic venous insufficiency and usually improves when she is not up on her feet all day.

## 2015-05-16 NOTE — Progress Notes (Signed)
HPI  Helen Parks is a pleasant 64 year old woman with a history of nonischemic cardiomyopathy, normal coronary arteries by catheterization in 2010, history of rheumatoid arthritis, breast cancer, status post mastectomy and chemotherapy in 1997 with no radiation therapy with ejection fraction noted to be 30% in 2005. MUGA scan in 1997 was normal. She presents for routine follow up. Her cardiomyopathy was felt to be due to previous chemotherapy. She had an episode of heart failure in 2010 which was likely triggered by the use of Enbrel for rheumatoid arthritis.  Her initial ejection fraction was 25-30% which improved to 35-40% in 2011after starting Coreg and Lisinopril. Most recent echocardiogram in June of this year showed an ejection fraction of 40-45% with moderate mitral regurgitation and no evidence of pulmonary hypertension. She has been doing very well with no chest pain or shortness of breath. She has mild orthostatic dizziness. She has chronic leg edema worse at the end of the day.  Allergies  Allergen Reactions  . Amoxicillin     REACTION: upset stomach  . Sulfasalazine     Other reaction(s): Other (See Comments) GI upset     Current Outpatient Prescriptions on File Prior to Visit  Medication Sig Dispense Refill  . Calcium 150 MG TABS Take by mouth daily.      . carvedilol (COREG) 6.25 MG tablet TAKE ONE TABLET BY MOUTH 2 TIMES A DAY 180 tablet 3  . cholecalciferol (VITAMIN D) 1000 UNITS tablet Take 1,000 Units by mouth daily.    . folic acid (FOLVITE) 1 MG tablet Take 1 mg by mouth daily.      . furosemide (LASIX) 20 MG tablet TAKE ONE TABLET BY MOUTH 2 TIMES A DAY 180 tablet 1  . hydroxychloroquine (PLAQUENIL) 200 MG tablet Take 200 mg by mouth daily.    . Lactobacillus-Inulin (Murphy) CAPS Take 1 capsule by mouth daily. 30 capsule 1  . lisinopril (PRINIVIL,ZESTRIL) 10 MG tablet TAKE ONE TABLET BY MOUTH DAILY 90 tablet 3  . methotrexate (50 MG/ML) 1 G  injection Inject 1 mg into the vein once a week.    . spironolactone (ALDACTONE) 25 MG tablet TAKE 1 TABLET (25 MG TOTAL) BY MOUTH DAILY. 90 tablet 3  . tamsulosin (FLOMAX) 0.4 MG CAPS capsule TAKE ONE CAPSULE BY MOUTH DAILY 30 capsule 5  . vitamin E 400 UNIT capsule Take 400 Units by mouth daily.       No current facility-administered medications on file prior to visit.     Past Medical History  Diagnosis Date  . Cardiomyopathy   . Diverticulosis of colon (without mention of hemorrhage) April 2012    occurred after colonoscopy, [place]  . Menopause     secondary to chemo for BRCA  . Rheumatoid arthritis(714.0)     on Plaquenil methotrxate Precious Reel)  . Cardiomyopathy due to chemotherapy     precipitated by use of Enbrel  2010  . Chronic systolic congestive heart failure   . Breast cancer 1997    s/p mastectomy, left breast, chemo     Past Surgical History  Procedure Laterality Date  . Rheumatology histroy    . Mastectomy  1997  . Foot surgery  2004  . 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 Cardiac catheterization  2010    normal coronary arteries     Family History  Problem Relation Age of Onset  . Heart disease Father   . Cancer  Paternal Aunt     ovarian  . Heart disease Maternal Grandmother      Social History   Social History  . Marital Status: Married    Spouse Name: N/A  . Number of Children: N/A  . Years of Education: N/A   Occupational History  . dental hygienist     full time   Social History Main Topics  . Smoking status: Never Smoker   . Smokeless tobacco: Not on file     Comment: tobacco use- no  . Alcohol Use: No  . Drug Use: No  . Sexual Activity: Not on file   Other Topics Concern  . Not on file   Social History Narrative   Lives with spouse.     PHYSICAL EXAM   BP 98/60 mmHg  Pulse 81  Ht _0  (1.626 m)  Wt 149 lb (67.586 kg)  BMI 25.56 kg/m2  Constitutional: She is oriented to person, place,  and time. She appears well-developed and well-nourished. No distress.  HENT: No nasal discharge.  Head: Normocephalic and atraumatic.  Eyes: Pupils are equal and round. Right eye exhibits no discharge. Left eye exhibits no discharge.  Neck: Normal range of motion. Neck supple. No JVD present. No thyromegaly present.  Cardiovascular: Normal rate, regular rhythm, normal heart sounds. Exam reveals no gallop and no friction rub. No murmur heard.  Pulmonary/Chest: Effort normal and breath sounds normal. No stridor. No respiratory distress. She has no wheezes. She has no rales. She exhibits no tenderness.  Abdominal: Soft. Bowel sounds are normal. She exhibits no distension. There is no tenderness. There is no rebound and no guarding.  Musculoskeletal: Normal range of motion. She exhibits trace edema and no tenderness.  The edema is worse on the right side.  Neurological: She is alert and oriented to person, place, and time. Coordination normal.  Skin: Skin is warm and dry. No rash noted. She is not diaphoretic. No erythema. No pallor.  Psychiatric: She has a normal mood and affect. Her behavior is normal. Judgment and thought content normal.      ASSESSMENT AND PLAN

## 2015-06-17 ENCOUNTER — Other Ambulatory Visit: Payer: Self-pay | Admitting: Cardiovascular Disease

## 2015-07-28 ENCOUNTER — Other Ambulatory Visit: Payer: Self-pay | Admitting: Cardiovascular Disease

## 2015-09-04 ENCOUNTER — Encounter: Payer: Self-pay | Admitting: Internal Medicine

## 2015-09-05 ENCOUNTER — Encounter: Payer: Self-pay | Admitting: Internal Medicine

## 2015-09-05 ENCOUNTER — Ambulatory Visit: Payer: Self-pay | Admitting: Physician Assistant

## 2015-09-05 ENCOUNTER — Ambulatory Visit (INDEPENDENT_AMBULATORY_CARE_PROVIDER_SITE_OTHER): Payer: 59 | Admitting: Internal Medicine

## 2015-09-05 VITALS — BP 106/70 | HR 78 | Temp 98.3°F | Resp 12 | Ht 62.5 in | Wt 147.5 lb

## 2015-09-05 DIAGNOSIS — E038 Other specified hypothyroidism: Secondary | ICD-10-CM | POA: Diagnosis not present

## 2015-09-05 DIAGNOSIS — E034 Atrophy of thyroid (acquired): Secondary | ICD-10-CM | POA: Diagnosis not present

## 2015-09-05 DIAGNOSIS — J069 Acute upper respiratory infection, unspecified: Secondary | ICD-10-CM

## 2015-09-05 DIAGNOSIS — B9789 Other viral agents as the cause of diseases classified elsewhere: Secondary | ICD-10-CM

## 2015-09-05 DIAGNOSIS — R5383 Other fatigue: Secondary | ICD-10-CM | POA: Diagnosis not present

## 2015-09-05 LAB — COMPREHENSIVE METABOLIC PANEL
ALT: 17 U/L (ref 0–35)
AST: 23 U/L (ref 0–37)
Albumin: 4.4 g/dL (ref 3.5–5.2)
Alkaline Phosphatase: 51 U/L (ref 39–117)
BUN: 14 mg/dL (ref 6–23)
CO2: 31 mEq/L (ref 19–32)
Calcium: 9.3 mg/dL (ref 8.4–10.5)
Chloride: 100 mEq/L (ref 96–112)
Creatinine, Ser: 0.69 mg/dL (ref 0.40–1.20)
GFR: 91.49 mL/min (ref >60.00)
Glucose, Bld: 85 mg/dL (ref 70–99)
Potassium: 4.4 mEq/L (ref 3.5–5.1)
Sodium: 138 mEq/L (ref 135–145)
Total Bilirubin: 0.6 mg/dL (ref 0.2–1.2)
Total Protein: 6.7 g/dL (ref 6.0–8.3)

## 2015-09-05 LAB — CBC WITH DIFFERENTIAL/PLATELET
Basophils Absolute: 0 10*3/uL (ref 0.0–0.1)
Basophils Relative: 0.6 % (ref 0.0–3.0)
Eosinophils Absolute: 0.1 10*3/uL (ref 0.0–0.7)
Eosinophils Relative: 1.1 % (ref 0.0–5.0)
HCT: 39.1 % (ref 36.0–46.0)
Hemoglobin: 13.1 g/dL (ref 12.0–15.0)
Lymphocytes Relative: 26.4 % (ref 12.0–46.0)
Lymphs Abs: 1.3 10*3/uL (ref 0.7–4.0)
MCHC: 33.4 g/dL (ref 30.0–36.0)
MCV: 96.2 fl (ref 78.0–100.0)
Monocytes Absolute: 0.8 10*3/uL (ref 0.1–1.0)
Monocytes Relative: 16.5 % — ABNORMAL HIGH (ref 3.0–12.0)
Neutro Abs: 2.6 10*3/uL (ref 1.4–7.7)
Neutrophils Relative %: 55.4 % (ref 43.0–77.0)
Platelets: 224 10*3/uL (ref 150.0–400.0)
RBC: 4.06 Mil/uL (ref 3.87–5.11)
RDW: 14 % (ref 11.5–15.5)
WBC: 4.8 10*3/uL (ref 4.0–10.5)

## 2015-09-05 LAB — IBC PANEL
Iron: 72 ug/dL (ref 42–145)
Saturation Ratios: 18.8 % — ABNORMAL LOW (ref 20.0–50.0)
Transferrin: 274 mg/dL (ref 212.0–360.0)

## 2015-09-05 LAB — TSH: TSH: 1.59 u[IU]/mL (ref 0.35–4.50)

## 2015-09-05 LAB — VITAMIN B12: Vitamin B-12: 314 pg/mL (ref 211–911)

## 2015-09-05 MED ORDER — BENZONATATE 200 MG PO CAPS: 200.0000 mg | ORAL_CAPSULE | Freq: Three times a day (TID) | ORAL | Status: DC | PRN

## 2015-09-05 MED ORDER — GUAIFENESIN-CODEINE 100-10 MG/5ML PO SYRP: 10.0000 mL | ORAL_SOLUTION | Freq: Every evening | ORAL | Status: DC | PRN

## 2015-09-05 MED ORDER — PREDNISONE 10 MG PO TABS: ORAL_TABLET | ORAL | Status: DC

## 2015-09-05 NOTE — Patient Instructions (Signed)
You most likely have a viral syndrome which is causing bronchitis.    The post nasal drip may alsoe be contributing to  your  Cough.  I am prescribing a prednisone taper  to manage the inflammation in your bronchial tubes, along with a daytime cough medicine (Tesslon capsules) and a nighttime syrup with codeine to help you rest.    I also advise use of the following OTC meds to help with your other symptoms.   Take generic OTC benadryl 25 mg  At bedtime for the drainage, flush your sinuses once or  twice daily with Simply Saline  Or [employer]Med's sinus rinse  If the coughing has not improved in  48 hours  OR  if you develop T > 100.4,  Green nasal discharge,  Or facial pain, contact me for an  Antibiotic and a chest x ray   .  If you end up starting an antibiotic,lease take a probiotic ( Align, Floraque or Culturelle) while you are on the antibiotic to prevent a serious antibiotic associated diarrhea  Called clostridium dificile colitis and a vaginal yeast infection

## 2015-09-05 NOTE — Progress Notes (Signed)
Subjective:  Patient ID: Helen Parks, female    DOB: 07-06-1951  Age: 65 y.o. MRN: [id number]  CC: The primary encounter diagnosis was Other fatigue. Diagnoses of Hypothyroidism due to acquired atrophy of thyroid and Viral URI with cough were also pertinent to this visit.  HPI [name] presents for persistent sinus congestion,  unaccompanied by fevers, facial pain, ear pain, pleurisy or wheezing.   symptoms have been present since Thanksgiving.  Cough started several days ago,  Keeping her up at night. No fevers or body aches,  But feels exhausted. Everyobdy in the office has been sick,  Family too.   Using OTC mucinex no significant change  Outpatient Prescriptions Prior to Visit  Medication Sig Dispense Refill  . Calcium 150 MG TABS Take by mouth daily.      . carvedilol (COREG) 6.25 MG tablet TAKE ONE TABLET BY MOUTH 2 TIMES A DAY 180 tablet 3  . cholecalciferol (VITAMIN D) 1000 UNITS tablet Take 1,000 Units by mouth daily.    . folic acid (FOLVITE) 1 MG tablet Take 1 mg by mouth daily.      . furosemide (LASIX) 20 MG tablet TAKE ONE TABLET BY MOUTH 2 TIMES A DAY 180 tablet 1  . hydroxychloroquine (PLAQUENIL) 200 MG tablet Take 200 mg by mouth daily.    . Lactobacillus-Inulin (Thayne) CAPS Take 1 capsule by mouth daily. 30 capsule 1  . lisinopril (PRINIVIL,ZESTRIL) 10 MG tablet TAKE ONE TABLET BY MOUTH DAILY 90 tablet 3  . methotrexate (50 MG/ML) 1 G injection Inject 1 mg into the vein once a week.    . spironolactone (ALDACTONE) 25 MG tablet TAKE 1 TABLET (25 MG TOTAL) BY MOUTH DAILY. 90 tablet 3  . tamsulosin (FLOMAX) 0.4 MG CAPS capsule TAKE ONE CAPSULE BY MOUTH DAILY 30 capsule 5  . vitamin E 400 UNIT capsule Take 400 Units by mouth daily.       No facility-administered medications prior to visit.    Review of Systems;  Patient denies headache, fevers, malaise, unintentional weight loss, skin rash, eye pain, sinus congestion and sinus pain, sore  throat, dysphagia,  hemoptysis , cough, dyspnea, wheezing, chest pain, palpitations, orthopnea, edema, abdominal pain, nausea, melena, diarrhea, constipation, flank pain, dysuria, hematuria, urinary  Frequency, nocturia, numbness, tingling, seizures,  Focal weakness, Loss of consciousness,  Tremor, insomnia, depression, anxiety, and suicidal ideation.      Objective:  BP 106/70 mmHg  Pulse 78  Temp(Src) 98.3 F (36.8 C) (Oral)  Resp 12  Ht 5' 2.5" (1.588 m)  Wt 147 lb 8 oz (66.906 kg)  BMI 26.53 kg/m2  SpO2 98%  BP Readings from Last 3 Encounters:  09/05/15 106/70  05/16/15 98/60  02/25/15 100/60    Wt Readings from Last 3 Encounters:  09/05/15 147 lb 8 oz (66.906 kg)  05/16/15 149 lb (67.586 kg)  02/25/15 150 lb (68.04 kg)    HEENT: Head: Normocephalic, without obvious abnormality, atraumatic, sinuses tender to percussion Eyes: conjunctivae/corneas clear. PERRL, EOM's intact. Fundi benign. Ears: normal TM's and external ear canals both ears Nose: Nares normal. Septum midline. Mucosa injected and red . Maxillary  sinus tenderness bilateral, no crusting or bleeding points Throat: lips, mucosa, and tongue normal; teeth and gums normal tonsillar pillars erythematous without ulcerations or edema Back: symmetric, no curvature. ROM normal. No CVA tenderness. Lungs: clear to auscultation bilaterally Heart: regular rate and rhythm, S1, S2 normal, no murmur, click, rub or gallop Abdomen: soft, non-tender; bowel  sounds normal; no masses,  no organomegaly Pulses: 2+ and symmetric Skin: Skin color, texture, turgor normal. No rashes or lesions Lymph nodes: Cervical, supraclavicular, and axillary nodes normal.  Lab Results  Component Value Date   HGBA1C 5.2 02/04/2010    Lab Results  Component Value Date   CREATININE 0.69 09/05/2015   CREATININE 0.72 01/31/2015   CREATININE 0.6 02/22/2014    Lab Results  Component Value Date   WBC 4.8 09/05/2015   HGB 13.1 09/05/2015   HCT  39.1 09/05/2015   PLT 224.0 09/05/2015   GLUCOSE 85 09/05/2015   CHOL 161 02/25/2015   TRIG 83.0 02/25/2015   HDL 58.40 02/25/2015   LDLCALC 86 02/25/2015   ALT 17 09/05/2015   AST 23 09/05/2015   NA 138 09/05/2015   K 4.4 09/05/2015   CL 100 09/05/2015   CREATININE 0.69 09/05/2015   BUN 14 09/05/2015   CO2 31 09/05/2015   TSH 1.59 09/05/2015   HGBA1C 5.2 02/04/2010    Mm Screening Breast Tomo Uni L  04/17/2015  CLINICAL DATA:  Screening. EXAM: DIGITAL SCREENING UNILATERAL LEFT MAMMOGRAM WITH TOMO AND CAD COMPARISON:  Previous exam(s). ACR Breast Density Category c: The breast tissue is heterogeneously dense, which may obscure small masses. FINDINGS: There are no findings suspicious for malignancy. Images were processed with CAD. IMPRESSION: No mammographic evidence of malignancy. A result letter of this screening mammogram will be mailed directly to the patient. RECOMMENDATION: Screening mammogram in one year. (Code:SM-B-01Y) BI-RADS CATEGORY  1: Negative. Electronically Signed   By: Abelardo Diesel M.D.   On: 04/17/2015 11:07    Assessment & Plan:   Problem List Items Addressed This Visit    Viral URI with cough    URI is most likely viral given the mild HEENT  Symptoms  And normal exam.   I have explained that in viral URIS, an antibiotic will not help the symptoms and will increase the risk of developing diarrhea.,  Continue oral and nasal decongestants, and tylenol 650 mq 8 hrs for aches and pains,  And will prednisone  taper for inflammation.        Fatigue - Primary   Relevant Orders   Comprehensive metabolic panel (Completed)   CBC with Differential/Platelet (Completed)   IBC panel (Completed)   B12 (Completed)    Other Visit Diagnoses    Hypothyroidism due to acquired atrophy of thyroid        Relevant Orders    TSH (Completed)       I am having Ms. Formisano start on benzonatate, predniSONE, and guaiFENesin-codeine. I am also having her maintain her Calcium, folic  acid, vitamin E, CULTURELLE [employer], methotrexate, hydroxychloroquine, cholecalciferol, spironolactone, furosemide, tamsulosin, lisinopril, and carvedilol.  Meds ordered this encounter  Medications  . benzonatate (TESSALON) 200 MG capsule    Sig: Take 1 capsule (200 mg total) by mouth 3 (three) times daily as needed for cough.    Dispense:  60 capsule    Refill:  1  . predniSONE (DELTASONE) 10 MG tablet    Sig: 6 tablets on Day 1 , then reduce by 1 tablet daily until gone    Dispense:  21 tablet    Refill:  0  . guaiFENesin-codeine (CHERATUSSIN AC) 100-10 MG/5ML syrup    Sig: Take 10 mLs by mouth at bedtime as needed for cough.    Dispense:  118 mL    Refill:  0    There are no discontinued medications.  Follow-up:  No Follow-up on file.   Crecencio Mc, MD

## 2015-09-05 NOTE — Progress Notes (Signed)
Pre-visit discussion using our clinic review tool. No additional management support is needed unless otherwise documented below in the visit note.  

## 2015-09-07 ENCOUNTER — Encounter: Payer: Self-pay | Admitting: Internal Medicine

## 2015-09-07 NOTE — Assessment & Plan Note (Signed)
URI is most likely viral given the mild HEENT  Symptoms  And normal exam.   I have explained that in viral URIS, an antibiotic will not help the symptoms and will increase the risk of developing diarrhea.,  Continue oral and nasal decongestants, and tylenol 650 mq 8 hrs for aches and pains,  And will prednisone  taper for inflammation 

## 2015-10-10 ENCOUNTER — Other Ambulatory Visit: Payer: Self-pay | Admitting: Internal Medicine

## 2015-10-10 NOTE — Telephone Encounter (Signed)
Lasix and flomax refilled

## 2015-11-07 DIAGNOSIS — Z08 Encounter for follow-up examination after completed treatment for malignant neoplasm: Secondary | ICD-10-CM | POA: Diagnosis not present

## 2015-11-07 DIAGNOSIS — Z85828 Personal history of other malignant neoplasm of skin: Secondary | ICD-10-CM | POA: Diagnosis not present

## 2015-11-07 DIAGNOSIS — C44719 Basal cell carcinoma of skin of left lower limb, including hip: Secondary | ICD-10-CM | POA: Diagnosis not present

## 2015-11-13 DIAGNOSIS — M0579 Rheumatoid arthritis with rheumatoid factor of multiple sites without organ or systems involvement: Secondary | ICD-10-CM | POA: Diagnosis not present

## 2015-11-17 ENCOUNTER — Encounter: Payer: Self-pay | Admitting: Cardiovascular Disease

## 2015-11-17 ENCOUNTER — Ambulatory Visit (INDEPENDENT_AMBULATORY_CARE_PROVIDER_SITE_OTHER): Payer: 59 | Admitting: Cardiovascular Disease

## 2015-11-17 VITALS — BP 108/60 | HR 71 | Ht 64.0 in | Wt 151.2 lb

## 2015-11-17 DIAGNOSIS — I5022 Chronic systolic (congestive) heart failure: Secondary | ICD-10-CM

## 2015-11-17 NOTE — Patient Instructions (Signed)
Medication Instructions: Continue same medications.   Labwork: None.   Procedures/Testing: None.   Follow-Up: 1 year with Dr. Arida.   Any Additional Special Instructions Will Be Listed Below (If Applicable).     If you need a refill on your cardiac medications before your next appointment, please call your pharmacy.   

## 2015-11-17 NOTE — Progress Notes (Signed)
Cardiology Office Note   Date:  11/17/2015   ID:  [name], DOB December 02, 1950, MRN [id number]  PCP:  Crecencio Mc, MD  Cardiologist:   Kathlyn Sacramento, MD   Chief Complaint  Patient presents with  . other    6 month follow up. Meds reviewed by the patient verbally. "doing well."       History of Present Illness: Helen Parks is a 65 y.o. 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 She had normal coronary arteries by catheterization in 2010, history of rheumatoid arthritis, breast cancer, status post mastectomy and chemotherapy in 1997 with no radiation therapy with ejection fraction noted to be 30% in 2005.   She had an episode of heart failure in 2010 which was likely triggered by the use of Enbrel for rheumatoid arthritis.  Her initial ejection fraction was 25-30% which improved to 35-40% in 2011after starting Coreg and Lisinopril. Most recent echocardiogram in June of 2016 showed an ejection fraction of 40-45% with moderate mitral regurgitation and no evidence of pulmonary hypertension.  She has been doing very well with no chest pain or shortness of breath. She has mild orthostatic dizziness. She has chronic leg edema worse at the end of the day.     Past Medical History  Diagnosis Date  . Cardiomyopathy   . Diverticulosis of colon (without mention of hemorrhage) April 2012    occurred after colonoscopy, [place]  . Menopause     secondary to chemo for BRCA  . Rheumatoid arthritis(714.0)     on Plaquenil methotrxate [facility])  . 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[place])     precipitated by use of Enbrel  2010  . Chronic systolic congestive heart failure ([place])   . Breast cancer ([place]) 1997    s/p mastectomy, left breast, chemo    Past Surgical History  Procedure Laterality Date  . Rheumatology histroy    . Mastectomy  1997  . Foot surgery  2004  . 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 Cardiac catheterization  2010    normal coronary arteries     Current Outpatient Prescriptions  Medication Sig Dispense Refill  . Calcium 150 MG TABS Take by mouth daily.      . carvedilol (COREG) 6.25 MG tablet TAKE ONE TABLET BY MOUTH 2 TIMES A DAY 180 tablet 3  . cholecalciferol (VITAMIN D) 1000 UNITS tablet Take 1,000 Units by mouth daily.    . folic acid (FOLVITE) 1 MG tablet Take 1 mg by mouth daily.      . furosemide (LASIX) 20 MG tablet TAKE ONE TABLET BY MOUTH 2 TIMES A DAY 180 tablet 3  . hydroxychloroquine (PLAQUENIL) 200 MG tablet Take 200 mg by mouth daily.    Marland Kitchen lisinopril (PRINIVIL,ZESTRIL) 10 MG tablet TAKE ONE TABLET BY MOUTH DAILY 90 tablet 3  . methotrexate (50 MG/ML) 1 G injection Inject 1 mg into the vein once a week.    . spironolactone (ALDACTONE) 25 MG tablet TAKE 1 TABLET (25 MG TOTAL) BY MOUTH DAILY. 90 tablet 3  . tamsulosin (FLOMAX) 0.4 MG CAPS capsule TAKE ONE CAPSULE BY MOUTH DAILY 30 capsule 11  . vitamin E 400 UNIT capsule Take 400 Units by mouth daily.       No current facility-administered medications for this visit.    Allergies:   Amoxicillin and Sulfasalazine    Social History:  The patient  reports that she has never smoked. She does not have any smokeless tobacco history on file. She reports that she does not drink alcohol or use illicit drugs.   Family History:  The patient's family history includes Cancer in her paternal aunt; Heart disease in her father and maternal grandmother.    ROS:  Please see the history of present illness.   Otherwise, review of systems are positive for none.   All other systems are reviewed and negative.    PHYSICAL EXAM: VS:  BP 108/60 mmHg  Pulse 71  Ht 5' 4" (1.626 m)  Wt 151 lb 4 oz (68.607 kg)  BMI 25.95 kg/m2 , BMI Body mass index is 25.95 kg/(m^2). GEN: Well nourished, well developed, in no acute distress HEENT: normal Neck: no JVD, carotid bruits, or  masses Cardiac: RRR; no murmurs, rubs, or gallops,no edema  Respiratory:  clear to auscultation bilaterally, normal work of breathing GI: soft, nontender, nondistended, + BS MS: no deformity or atrophy Skin: warm and dry, no rash Neuro:  Strength and sensation are intact Psych: euthymic mood, full affect   EKG:  EKG is ordered today. The ekg ordered today demonstrates normal sinus rhythm with low voltage and no significant ST or T wave changes.   Recent Labs: 01/31/2015: BNP 20.2 09/05/2015: ALT 17; BUN 14; Creatinine, Ser 0.69; Hemoglobin 13.1; Platelets 224.0; Potassium 4.4; Sodium 138; TSH 1.59    Lipid Panel    Component Value Date/Time   CHOL 161 02/25/2015 1627   TRIG 83.0 02/25/2015 1627   TRIG 82 02/04/2010   HDL 58.40 02/25/2015 1627   CHOLHDL 3 02/25/2015 1627   VLDL 16.6 02/25/2015 1627   LDLCALC 86 02/25/2015 1627      Wt Readings from Last 3 Encounters:  11/17/15 151 lb 4 oz (68.607 kg)  09/05/15 147 lb 8 oz (66.906 kg)  05/16/15 149 lb (67.586 kg)        ASSESSMENT AND PLAN:  1.  Chronic systolic heart failure: Due to chemotherapy-induced cardiomyopathy. Most recent ejection fraction was 40-45%. There is no evidence of volume overload and she continues to be stable on current dose of furosemide. She is currently on optimal medical therapy and I made no changes today.  2. Rheumatoid arthritis: This seems to be stable on current medications.      Disposition:   FU with me in 1 year  Signed,  Kathlyn Sacramento, MD  11/17/2015 6:24 PM    [place]

## 2015-11-20 DIAGNOSIS — I5022 Chronic systolic (congestive) heart failure: Secondary | ICD-10-CM | POA: Diagnosis not present

## 2015-11-20 DIAGNOSIS — Z79899 Other long term (current) drug therapy: Secondary | ICD-10-CM | POA: Diagnosis not present

## 2015-11-20 DIAGNOSIS — M0579 Rheumatoid arthritis with rheumatoid factor of multiple sites without organ or systems involvement: Secondary | ICD-10-CM | POA: Diagnosis not present

## 2015-12-02 ENCOUNTER — Encounter: Payer: Self-pay | Admitting: Internal Medicine

## 2015-12-03 ENCOUNTER — Other Ambulatory Visit: Payer: Self-pay | Admitting: Internal Medicine

## 2015-12-03 MED ORDER — LEVOFLOXACIN 500 MG PO TABS: 500.0000 mg | ORAL_TABLET | Freq: Every day | ORAL | Status: DC

## 2015-12-04 MED ORDER — SCOPOLAMINE 1 MG/3DAYS TD PT72: 1.0000 | MEDICATED_PATCH | TRANSDERMAL | Status: DC

## 2015-12-05 NOTE — Progress Notes (Signed)
Patient scheduled nurse visit

## 2015-12-10 ENCOUNTER — Ambulatory Visit (INDEPENDENT_AMBULATORY_CARE_PROVIDER_SITE_OTHER): Payer: 59

## 2015-12-10 DIAGNOSIS — Z23 Encounter for immunization: Secondary | ICD-10-CM | POA: Diagnosis not present

## 2015-12-10 NOTE — Progress Notes (Signed)
Patient originally came in for a HepA/B injection, patient has decided to decline that injection at this time as she would only have had one of the three when she is traveling.  Patient did decide to have her pneumonia vaccine instead that was to be done in one week.  Patient received injection in left deltoid.  Patient tolerated well.

## 2015-12-29 ENCOUNTER — Ambulatory Visit (INDEPENDENT_AMBULATORY_CARE_PROVIDER_SITE_OTHER): Payer: 59 | Admitting: Nurse Practitioner

## 2015-12-29 ENCOUNTER — Encounter: Payer: Self-pay | Admitting: Nurse Practitioner

## 2015-12-29 VITALS — BP 112/78 | HR 71 | Temp 98.1°F | Ht 64.0 in | Wt 151.0 lb

## 2015-12-29 DIAGNOSIS — R109 Unspecified abdominal pain: Secondary | ICD-10-CM | POA: Insufficient documentation

## 2015-12-29 LAB — CBC WITH DIFFERENTIAL/PLATELET
Basophils Absolute: 0 10*3/uL (ref 0.0–0.1)
Basophils Relative: 0.3 % (ref 0.0–3.0)
Eosinophils Absolute: 0.1 10*3/uL (ref 0.0–0.7)
Eosinophils Relative: 1 % (ref 0.0–5.0)
HCT: 35.5 % — ABNORMAL LOW (ref 36.0–46.0)
Hemoglobin: 12.1 g/dL (ref 12.0–15.0)
Lymphocytes Relative: 25.3 % (ref 12.0–46.0)
Lymphs Abs: 1.4 10*3/uL (ref 0.7–4.0)
MCHC: 34.1 g/dL (ref 30.0–36.0)
MCV: 94.8 fl (ref 78.0–100.0)
Monocytes Absolute: 0.4 10*3/uL (ref 0.1–1.0)
Monocytes Relative: 7.8 % (ref 3.0–12.0)
Neutro Abs: 3.6 10*3/uL (ref 1.4–7.7)
Neutrophils Relative %: 65.6 % (ref 43.0–77.0)
Platelets: 233 10*3/uL (ref 150.0–400.0)
RBC: 3.74 Mil/uL — ABNORMAL LOW (ref 3.87–5.11)
RDW: 13.6 % (ref 11.5–15.5)
WBC: 5.4 10*3/uL (ref 4.0–10.5)

## 2015-12-29 LAB — POC URINALSYSI DIPSTICK (AUTOMATED)
Bilirubin, UA: NEGATIVE
Glucose, UA: NEGATIVE
Ketones, UA: NEGATIVE
Leukocytes, UA: NEGATIVE
Nitrite, UA: NEGATIVE
Protein, UA: NEGATIVE
Spec Grav, UA: <=1.005
Urobilinogen, UA: 0.2
pH, UA: 6

## 2015-12-29 MED ORDER — METRONIDAZOLE 500 MG PO TABS: 500.0000 mg | ORAL_TABLET | Freq: Three times a day (TID) | ORAL | Status: DC

## 2015-12-29 MED ORDER — CIPROFLOXACIN HCL 500 MG PO TABS: 500.0000 mg | ORAL_TABLET | Freq: Two times a day (BID) | ORAL | Status: DC

## 2015-12-29 NOTE — Assessment & Plan Note (Signed)
New onset Concern for diverticulitis  CBC w/ diff ordered, POCT clear, obtain culture due to low spec. Gravity.  Started on Cipro + flagyl - gave warnings about concurrent alcohol use. Start on 48 hr clear liquid diet and then soft foods Wed.  Call wed. To check in on her before leaving.  Seek ED care discussed on handout

## 2015-12-29 NOTE — Progress Notes (Signed)
Patient ID: Helen Parks, female    DOB: 06/26/1951  Age: 65 y.o. MRN: [id number]  CC: Abdominal Pain   HPI Helen Parks presents for CC of abdominal pain x 3 days left side.   1) Onset- 3 days ago  Location- LLQ  Duration - intermittent  Characteristics- pulling to sharp pain  Aggravating factors- hard to lie down and get comfortable  Relieving factors- resting  Severity- 6/10 worst   Has had diverticulitis in past Denies treatment to date  Last BM today- normal  Leaving for [place] - flying to a resort this Saturday  History Helen Parks has a past medical history of Cardiomyopathy; Diverticulosis of colon (without mention of hemorrhage) (April 2012); Menopause; Rheumatoid arthritis(714.0); Cardiomyopathy due to chemotherapy [facility]); Chronic systolic congestive heart failure ([place]); and Breast cancer ([place]) (1997).   She has past surgical history that includes rheumatology histroy; Mastectomy (1997); Foot surgery (2004); Ankle Fusion; and Cardiac catheterization (2010).   Her family history includes Cancer in her paternal aunt; Heart disease in her father and maternal grandmother.She reports that she has never smoked. She does not have any smokeless tobacco history on file. She reports that she does not drink alcohol or use illicit drugs.  Outpatient Prescriptions Prior to Visit  Medication Sig Dispense Refill  . Calcium 150 MG TABS Take by mouth daily.      . carvedilol (COREG) 6.25 MG tablet TAKE ONE TABLET BY MOUTH 2 TIMES A DAY 180 tablet 3  . cholecalciferol (VITAMIN D) 1000 UNITS tablet Take 1,000 Units by mouth daily.    . folic acid (FOLVITE) 1 MG tablet Take 1 mg by mouth daily.      . furosemide (LASIX) 20 MG tablet TAKE ONE TABLET BY MOUTH 2 TIMES A DAY 180 tablet 3  . hydroxychloroquine (PLAQUENIL) 200 MG tablet Take 200 mg by mouth daily.    Marland Kitchen lisinopril (PRINIVIL,ZESTRIL) 10 MG tablet TAKE ONE TABLET BY MOUTH DAILY 90 tablet 3  . methotrexate (50 MG/ML) 1 G injection  Inject 1 mg into the vein once a week.    Marland Kitchen scopolamine (TRANSDERM-SCOP, 1.5 MG,) 1 MG/3DAYS Place 1 patch (1.5 mg total) onto the skin every 3 (three) days. 4 patch 0  . spironolactone (ALDACTONE) 25 MG tablet TAKE 1 TABLET (25 MG TOTAL) BY MOUTH DAILY. 90 tablet 3  . tamsulosin (FLOMAX) 0.4 MG CAPS capsule TAKE ONE CAPSULE BY MOUTH DAILY 30 capsule 11  . vitamin E 400 UNIT capsule Take 400 Units by mouth daily.      Marland Kitchen levofloxacin (LEVAQUIN) 500 MG tablet Take 1 tablet (500 mg total) by mouth daily. (Patient not taking: Reported on 12/29/2015) 7 tablet 0   No facility-administered medications prior to visit.    ROS Review of Systems  Constitutional: Negative for fever, chills, diaphoresis and fatigue.  Respiratory: Negative for chest tightness, shortness of breath and wheezing.   Cardiovascular: Negative for chest pain, palpitations and leg swelling.  Gastrointestinal: Positive for abdominal pain. Negative for nausea, vomiting, diarrhea, constipation, blood in stool, abdominal distention, anal bleeding and rectal pain.  Skin: Negative for rash.  Neurological: Negative for dizziness, numbness and headaches.  Psychiatric/Behavioral: The patient is not nervous/anxious.     Objective:  BP 112/78 mmHg  Pulse 71  Temp(Src) 98.1 F (36.7 C) (Oral)  Ht 5\' 4"  (1.626 m)  Wt 151 lb (68.493 kg)  BMI 25.91 kg/m2  SpO2 99%  Physical Exam  Constitutional: She is oriented to person, place, and time. She  appears well-developed and well-nourished. No distress.  HENT:  Head: Normocephalic and atraumatic.  Right Ear: External ear normal.  Left Ear: External ear normal.  Eyes: Right eye exhibits no discharge. Left eye exhibits no discharge. No scleral icterus.  Neck: Normal range of motion.  Cardiovascular: Normal rate, regular rhythm and normal heart sounds.   Pulmonary/Chest: Effort normal and breath sounds normal. No respiratory distress. She has no wheezes. She has no rales. She exhibits no  tenderness.  Abdominal: Soft. Bowel sounds are normal. She exhibits no distension and no mass. There is tenderness. There is no rebound and no guarding.  LLQ tenderness- mild to palpation Negative for CVA tenderness  Neurological: She is alert and oriented to person, place, and time. No cranial nerve deficit. She exhibits normal muscle tone. Coordination normal.  Skin: Skin is warm and dry. No rash noted. She is not diaphoretic.  Psychiatric: She has a normal mood and affect. Her behavior is normal. Judgment and thought content normal.   Assessment & Plan:   Helen Parks was seen today for abdominal pain.  Diagnoses and all orders for this visit:  Left sided abdominal pain -     POCT Urinalysis Dipstick (Automated) -     CBC with Differential/Platelet -     Urine culture  Other orders -     ciprofloxacin (CIPRO) 500 MG tablet; Take 1 tablet (500 mg total) by mouth 2 (two) times daily. -     metroNIDAZOLE (FLAGYL) 500 MG tablet; Take 1 tablet (500 mg total) by mouth 3 (three) times daily.   I have discontinued Helen Parks's levofloxacin. I am also having her start on ciprofloxacin and metroNIDAZOLE. Additionally, I am having her maintain her Calcium, folic acid, vitamin E, methotrexate, hydroxychloroquine, cholecalciferol, spironolactone, lisinopril, carvedilol, furosemide, tamsulosin, and scopolamine.  Meds ordered this encounter  Medications  . ciprofloxacin (CIPRO) 500 MG tablet    Sig: Take 1 tablet (500 mg total) by mouth 2 (two) times daily.    Dispense:  14 tablet    Refill:  0    Order Specific Question:  Supervising Provider    Answer:  Deborra Medina L [[id number]]  . metroNIDAZOLE (FLAGYL) 500 MG tablet    Sig: Take 1 tablet (500 mg total) by mouth 3 (three) times daily.    Dispense:  21 tablet    Refill:  0    Order Specific Question:  Supervising Provider    Answer:  Crecencio Mc [[id number]]     Follow-up: Return if symptoms worsen or fail to improve.

## 2015-12-29 NOTE — Patient Instructions (Addendum)
Liquid diet for 2 days then soft foods- antibiotics as prescribed Please take a probiotic ( Align, Floraque or Culturelle) while you are on the antibiotic to prevent a serious antibiotic associated diarrhea  Called clostirudium dificile colitis and a vaginal yeast infection. Generics are okay.   Diverticulitis Diverticulitis is inflammation or infection of small pouches in your colon that form when you have a condition called diverticulosis. The pouches in your colon are called diverticula. Your colon, or large intestine, is where water is absorbed and stool is formed. Complications of diverticulitis can include:  Bleeding.  Severe infection.  Severe pain.  Perforation of your colon.  Obstruction of your colon. CAUSES  Diverticulitis is caused by bacteria. Diverticulitis happens when stool becomes trapped in diverticula. This allows bacteria to grow in the diverticula, which can lead to inflammation and infection. RISK FACTORS People with diverticulosis are at risk for diverticulitis. Eating a diet that does not include enough fiber from fruits and vegetables may make diverticulitis more likely to develop. SYMPTOMS  Symptoms of diverticulitis may include:  Abdominal pain and tenderness. The pain is normally located on the left side of the abdomen, but may occur in other areas.  Fever and chills.  Bloating.  Cramping.  Nausea.  Vomiting.  Constipation.  Diarrhea.  Blood in your stool. DIAGNOSIS  Your health care provider will ask you about your medical history and do a physical exam. You may need to have tests done because many medical conditions can cause the same symptoms as diverticulitis. Tests may include:  Blood tests.  Urine tests.  Imaging tests of the abdomen, including X-rays and CT scans. When your condition is under control, your health care provider may recommend that you have a colonoscopy. A colonoscopy can show how severe your diverticula are and  whether something else is causing your symptoms. TREATMENT  Most cases of diverticulitis are mild and can be treated at home. Treatment may include:  Taking over-the-counter pain medicines.  Following a clear liquid diet.  Taking antibiotic medicines by mouth for 7-10 days. More severe cases may be treated at a hospital. Treatment may include:  Not eating or drinking.  Taking prescription pain medicine.  Receiving antibiotic medicines through an IV tube.  Receiving fluids and nutrition through an IV tube.  Surgery. HOME CARE INSTRUCTIONS   Follow your health care provider's instructions carefully.  Follow a full liquid diet or other diet as directed by your health care provider. After your symptoms improve, your health care provider may tell you to change your diet. He or she may recommend you eat a high-fiber diet. Fruits and vegetables are good sources of fiber. Fiber makes it easier to pass stool.  Take fiber supplements or probiotics as directed by your health care provider.  Only take medicines as directed by your health care provider.  Keep all your follow-up appointments. SEEK MEDICAL CARE IF:   Your pain does not improve.  You have a hard time eating food.  Your bowel movements do not return to normal. SEEK IMMEDIATE MEDICAL CARE IF:   Your pain becomes worse.  Your symptoms do not get better.  Your symptoms suddenly get worse.  You have a fever.  You have repeated vomiting.  You have bloody or black, tarry stools. MAKE SURE YOU:   Understand these instructions.  Will watch your condition.  Will get help right away if you are not doing well or get worse.   This information is not intended to replace advice  given to you by your health care provider. Make sure you discuss any questions you have with your health care provider.   Document Released: 05/26/2005 Document Revised: 08/21/2013 Document Reviewed: 07/11/2013 Elsevier Interactive Patient  Education [employer].

## 2015-12-31 ENCOUNTER — Telehealth: Payer: Self-pay | Admitting: Nurse Practitioner

## 2015-12-31 ENCOUNTER — Encounter: Payer: Self-pay | Admitting: Nurse Practitioner

## 2015-12-31 NOTE — Telephone Encounter (Signed)
Told pt I would call and check on her today- called and left a message to call back if not feeling better.

## 2016-01-01 ENCOUNTER — Telehealth: Payer: Self-pay | Admitting: Internal Medicine

## 2016-01-01 NOTE — Telephone Encounter (Signed)
Ok! Thank you! I left pt a msg to call me back.

## 2016-01-01 NOTE — Telephone Encounter (Signed)
Pt sent a Mychart message wanting to get Tetanus/Tdap. I wanted to confirm before I sch. Thank you!

## 2016-01-01 NOTE — Telephone Encounter (Signed)
Patient needs to check with her insurance to see if it is covered in the doctors office first.  Then we can schedule.  Thanks

## 2016-01-02 LAB — URINE CULTURE: Colony Count: >=100000

## 2016-01-16 DIAGNOSIS — Z1211 Encounter for screening for malignant neoplasm of colon: Secondary | ICD-10-CM | POA: Diagnosis not present

## 2016-01-27 ENCOUNTER — Ambulatory Visit
Admission: RE | Admit: 2016-01-27 | Discharge: 2016-01-27 | Disposition: A | Discharge status: Home / Self Care | Payer: 59 | Source: Ambulatory Visit | Attending: Family | Admitting: Family

## 2016-01-27 ENCOUNTER — Encounter: Payer: Self-pay | Admitting: Family

## 2016-01-27 ENCOUNTER — Ambulatory Visit (INDEPENDENT_AMBULATORY_CARE_PROVIDER_SITE_OTHER): Payer: 59 | Admitting: Family

## 2016-01-27 ENCOUNTER — Ambulatory Visit: Payer: Self-pay | Admitting: Family Medicine

## 2016-01-27 ENCOUNTER — Telehealth: Payer: Self-pay

## 2016-01-27 ENCOUNTER — Telehealth: Payer: Self-pay | Admitting: Internal Medicine

## 2016-01-27 ENCOUNTER — Other Ambulatory Visit: Payer: Self-pay | Admitting: Family

## 2016-01-27 VITALS — BP 112/68 | HR 71 | Temp 98.5°F | Ht 64.0 in | Wt 152.0 lb

## 2016-01-27 DIAGNOSIS — R1032 Left lower quadrant pain: Secondary | ICD-10-CM

## 2016-01-27 DIAGNOSIS — R938 Abnormal findings on diagnostic imaging of other specified body structures: Secondary | ICD-10-CM | POA: Insufficient documentation

## 2016-01-27 DIAGNOSIS — K5730 Diverticulosis of large intestine without perforation or abscess without bleeding: Secondary | ICD-10-CM | POA: Insufficient documentation

## 2016-01-27 HISTORY — DX: Systemic involvement of connective tissue, unspecified: M35.9

## 2016-01-27 LAB — CBC WITH DIFFERENTIAL/PLATELET
Basophils Absolute: 0 10*3/uL (ref 0.0–0.1)
Basophils Relative: 0.4 % (ref 0.0–3.0)
Eosinophils Absolute: 0.1 10*3/uL (ref 0.0–0.7)
Eosinophils Relative: 0.9 % (ref 0.0–5.0)
HCT: 35.3 % — ABNORMAL LOW (ref 36.0–46.0)
Hemoglobin: 12.1 g/dL (ref 12.0–15.0)
Lymphocytes Relative: 19.1 % (ref 12.0–46.0)
Lymphs Abs: 1.2 10*3/uL (ref 0.7–4.0)
MCHC: 34.1 g/dL (ref 30.0–36.0)
MCV: 94.3 fl (ref 78.0–100.0)
Monocytes Absolute: 0.5 10*3/uL (ref 0.1–1.0)
Monocytes Relative: 7.7 % (ref 3.0–12.0)
Neutro Abs: 4.5 10*3/uL (ref 1.4–7.7)
Neutrophils Relative %: 71.9 % (ref 43.0–77.0)
Platelets: 231 10*3/uL (ref 150.0–400.0)
RBC: 3.75 Mil/uL — ABNORMAL LOW (ref 3.87–5.11)
RDW: 13.7 % (ref 11.5–15.5)
WBC: 6.2 10*3/uL (ref 4.0–10.5)

## 2016-01-27 LAB — POCT I-STAT CREATININE: Creatinine, Ser: 0.7 mg/dL (ref 0.44–1.00)

## 2016-01-27 MED ORDER — CIPROFLOXACIN HCL 500 MG PO TABS: 500.0000 mg | ORAL_TABLET | Freq: Two times a day (BID) | ORAL | Status: DC

## 2016-01-27 MED ORDER — IOPAMIDOL (ISOVUE-300) INJECTION 61%
100.0000 mL | Freq: Once | INTRAVENOUS | Status: AC | PRN
  Administered 2016-01-27: 100 mL via INTRAVENOUS

## 2016-01-27 MED ORDER — METRONIDAZOLE 500 MG PO TABS: 500.0000 mg | ORAL_TABLET | Freq: Three times a day (TID) | ORAL | Status: DC

## 2016-01-27 NOTE — Patient Instructions (Addendum)
Start course of ciprofloxacin and metronidazole.   As discussed, able to treat you as an outpatient because of: 1) Low co-morbidities 2 ) Afebrile 3) Live in home with a support system 4) Able to drink fluids and eat 5) Understand the risks of abscess, fistula, rupture and signs to be aware for diverticulitis complications.   Continue to monitor symptoms, if diarrhea continues and doesn't improve, or you begin to have systemic features including fever, vomiting, chills, abdominal pain, nausea, please return to clinic as may be experiencing flare of diverticulosis/itis.  Fluids. Diet high in fiber. Continue medications if prescribed for diverticulosis.   If your symptoms do not improve, you experience a fever, or your abdomen becomes stiff and painful, immediately go to the ED.   Diverticulitis Diverticulitis is inflammation or infection of small pouches in your colon that form when you have a condition called diverticulosis. The pouches in your colon are called diverticula. Your colon, or large intestine, is where water is absorbed and stool is formed. Complications of diverticulitis can include:  Bleeding.  Severe infection.  Severe pain.  Perforation of your colon.  Obstruction of your colon. CAUSES  Diverticulitis is caused by bacteria. Diverticulitis happens when stool becomes trapped in diverticula. This allows bacteria to grow in the diverticula, which can lead to inflammation and infection. RISK FACTORS People with diverticulosis are at risk for diverticulitis. Eating a diet that does not include enough fiber from fruits and vegetables may make diverticulitis more likely to develop. SYMPTOMS  Symptoms of diverticulitis may include:  Abdominal pain and tenderness. The pain is normally located on the left side of the abdomen, but may occur in other areas.  Fever and chills.  Bloating.  Cramping.  Nausea.  Vomiting.  Constipation.  Diarrhea.  Blood in your  stool. DIAGNOSIS  Your health care provider will ask you about your medical history and do a physical exam. You may need to have tests done because many medical conditions can cause the same symptoms as diverticulitis. Tests may include:  Blood tests.  Urine tests.  Imaging tests of the abdomen, including X-rays and CT scans. When your condition is under control, your health care provider may recommend that you have a colonoscopy. A colonoscopy can show how severe your diverticula are and whether something else is causing your symptoms. TREATMENT  Most cases of diverticulitis are mild and can be treated at home. Treatment may include:  Taking over-the-counter pain medicines.  Following a clear liquid diet.  Taking antibiotic medicines by mouth for 7-10 days. More severe cases may be treated at a hospital. Treatment may include:  Not eating or drinking.  Taking prescription pain medicine.  Receiving antibiotic medicines through an IV tube.  Receiving fluids and nutrition through an IV tube.  Surgery. HOME CARE INSTRUCTIONS   Follow your health care provider's instructions carefully.  Follow a full liquid diet or other diet as directed by your health care provider. After your symptoms improve, your health care provider may tell you to change your diet. He or she may recommend you eat a high-fiber diet. Fruits and vegetables are good sources of fiber. Fiber makes it easier to pass stool.  Take fiber supplements or probiotics as directed by your health care provider.  Only take medicines as directed by your health care provider.  Keep all your follow-up appointments. SEEK MEDICAL CARE IF:   Your pain does not improve.  You have a hard time eating food.  Your bowel movements do not  return to normal. SEEK IMMEDIATE MEDICAL CARE IF:   Your pain becomes worse.  Your symptoms do not get better.  Your symptoms suddenly get worse.  You have a fever.  You have repeated  vomiting.  You have bloody or black, tarry stools. MAKE SURE YOU:   Understand these instructions.  Will watch your condition.  Will get help right away if you are not doing well or get worse.   This information is not intended to replace advice given to you by your health care provider. Make sure you discuss any questions you have with your health care provider.   Document Released: 05/26/2005 Document Revised: 08/21/2013 Document Reviewed: 07/11/2013 Elsevier Interactive Patient Education [employer].

## 2016-01-27 NOTE — Telephone Encounter (Signed)
Stat Ct read called to the office by Linus Orn,  Results available in EPIC:  Sigmoid diverticulosis is noted without definite evidence of acute inflammation. Wall and fold thickening of the sigmoid colon is noted most likely representing chronic sequela of previous inflammation  Patient has left and would like a call from provider. Thanks

## 2016-01-27 NOTE — Progress Notes (Signed)
Subjective:    Patient ID: Helen Parks, female    DOB: 1951/08/15, 65 y.o.   MRN: [id number]   Helen Parks is a 65 y.o. female who presents today for an acute visit.    HPI Comments:   Per chart review, patient was seen 5/1 for LLQ. Pain improved after 4 days on antibiotics and then returned over the past 2 days. She had been prescribed cipro and flagyl.  Hospitalized for diverticulitis 5 years ago. No diverticulitis complications however BP dropped from pain medication.   Abdominal Pain This is a recurrent problem. The current episode started in the past 7 days. The onset quality is sudden. The problem occurs constantly. The problem has been gradually worsening. The pain is located in the LLQ. The pain is moderate. The quality of the pain is sharp. The abdominal pain does not radiate. Pertinent negatives include no constipation, diarrhea, dysuria, fever, myalgias, nausea or vomiting. Nothing aggravates the pain. The pain is relieved by nothing. She has tried nothing for the symptoms. There is no history of abdominal surgery, gallstones or pancreatitis.   Past Medical History  Diagnosis Date  . Cardiomyopathy   . Diverticulosis of colon (without mention of hemorrhage) April 2012    occurred after colonoscopy, [place]  . Menopause     secondary to chemo for BRCA  . Rheumatoid arthritis(714.0)     on Plaquenil methotrxate Precious Reel)  . 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 Chronic systolic congestive heart failure ([place])   . Breast cancer ([place]) 1997    s/p mastectomy, left breast, chemo   Allergies: Amoxicillin and Sulfasalazine Current Outpatient Prescriptions on File Prior to Visit  Medication Sig Dispense Refill  . Calcium 150 MG TABS Take by mouth daily.      . carvedilol (COREG) 6.25 MG tablet TAKE ONE TABLET BY MOUTH 2 TIMES A DAY 180 tablet 3  . cholecalciferol (VITAMIN D) 1000 UNITS tablet Take 1,000 Units by mouth  daily.    . ciprofloxacin (CIPRO) 500 MG tablet Take 1 tablet (500 mg total) by mouth 2 (two) times daily. 14 tablet 0  . folic acid (FOLVITE) 1 MG tablet Take 1 mg by mouth daily.      . furosemide (LASIX) 20 MG tablet TAKE ONE TABLET BY MOUTH 2 TIMES A DAY 180 tablet 3  . hydroxychloroquine (PLAQUENIL) 200 MG tablet Take 200 mg by mouth daily.    Marland Kitchen lisinopril (PRINIVIL,ZESTRIL) 10 MG tablet TAKE ONE TABLET BY MOUTH DAILY 90 tablet 3  . methotrexate (50 MG/ML) 1 G injection Inject 1 mg into the vein once a week.    . metroNIDAZOLE (FLAGYL) 500 MG tablet Take 1 tablet (500 mg total) by mouth 3 (three) times daily. 21 tablet 0  . scopolamine (TRANSDERM-SCOP, 1.5 MG,) 1 MG/3DAYS Place 1 patch (1.5 mg total) onto the skin every 3 (three) days. 4 patch 0  . spironolactone (ALDACTONE) 25 MG tablet TAKE 1 TABLET (25 MG TOTAL) BY MOUTH DAILY. 90 tablet 3  . tamsulosin (FLOMAX) 0.4 MG CAPS capsule TAKE ONE CAPSULE BY MOUTH DAILY 30 capsule 11  . vitamin E 400 UNIT capsule Take 400 Units by mouth daily.       No current facility-administered medications on file prior to visit.    Social History  Substance Use Topics  . Smoking status: Never Smoker   . Smokeless tobacco: None     Comment: tobacco  use- no  . Alcohol Use: No    Review of Systems  Constitutional: Negative for fever, chills and appetite change.  Respiratory: Negative for cough.   Gastrointestinal: Positive for abdominal pain. Negative for nausea, vomiting, diarrhea, constipation, blood in stool and abdominal distention.  Genitourinary: Negative for dysuria.  Musculoskeletal: Negative for myalgias.      Objective:    BP 112/68 mmHg  Pulse 71  Temp(Src) 98.5 F (36.9 C)  Ht _0  (1.626 m)  Wt 152 lb (68.947 kg)  BMI 26.08 kg/m2  SpO2 98%   Physical Exam  Constitutional: She appears well-developed and well-nourished.  Eyes: Conjunctivae are normal.  Cardiovascular: Normal rate, regular rhythm, normal heart sounds and  normal pulses.   Pulmonary/Chest: Effort normal and breath sounds normal. She has no wheezes. She has no rhonchi. She has no rales.  Abdominal: Soft. Normal appearance and bowel sounds are normal. She exhibits no distension, no fluid wave, no ascites and no mass. There is tenderness in the left lower quadrant. There is no rigidity, no rebound, no CVA tenderness, no tenderness at McBurney's point and negative Murphy's sign.  Neurological: She is alert.  Skin: Skin is warm and dry.  Psychiatric: She has a normal mood and affect. Her speech is normal and behavior is normal. Thought content normal.  Vitals reviewed.      Assessment & Plan:   1. LLQ abdominal pain Working diagnosis of diverticulitis. Pending CT abdomen and pelvis. Empirically starting ciprofloxacin and metronidazole while we wait on results.   - CT Abdomen Pelvis W Wo Contrast; Future - CBC with Differential/Platelet; Future - ciprofloxacin (CIPRO) 500 MG tablet; Take 1 tablet (500 mg total) by mouth 2 (two) times daily.  Dispense: 14 tablet; Refill: 0 - metroNIDAZOLE (FLAGYL) 500 MG tablet; Take 1 tablet (500 mg total) by mouth 3 (three) times daily.  Dispense: 21 tablet; Refill: 0 - CBC with Differential/Platelet    I am having Ms. Lammert maintain her Calcium, folic acid, vitamin E, methotrexate, hydroxychloroquine, cholecalciferol, spironolactone, lisinopril, carvedilol, furosemide, tamsulosin, scopolamine, ciprofloxacin, and metroNIDAZOLE.   No orders of the defined types were placed in this encounter.     Start medications as prescribed and explained to patient on After Visit Summary ( AVS). Risks, benefits, and alternatives of the medications and treatment plan prescribed today were discussed, and patient expressed understanding.   Education regarding symptom management and diagnosis given to patient.   Follow-up:Plan follow-up and return precautions given if any worsening symptoms or change in condition.    Continue to follow with Crecencio Mc, MD for routine health maintenance.   Helen Parks and I agreed with plan.   Mable Paris, FNP

## 2016-01-28 ENCOUNTER — Ambulatory Visit: Payer: 59

## 2016-02-12 ENCOUNTER — Other Ambulatory Visit: Payer: Self-pay | Admitting: Cardiovascular Disease

## 2016-02-18 DIAGNOSIS — M0579 Rheumatoid arthritis with rheumatoid factor of multiple sites without organ or systems involvement: Secondary | ICD-10-CM | POA: Diagnosis not present

## 2016-02-18 DIAGNOSIS — Z79899 Other long term (current) drug therapy: Secondary | ICD-10-CM | POA: Diagnosis not present

## 2016-03-05 ENCOUNTER — Other Ambulatory Visit: Payer: Self-pay | Admitting: Internal Medicine

## 2016-03-05 DIAGNOSIS — Z1231 Encounter for screening mammogram for malignant neoplasm of breast: Secondary | ICD-10-CM

## 2016-03-25 ENCOUNTER — Encounter: Payer: Self-pay | Admitting: *Deleted

## 2016-03-26 ENCOUNTER — Ambulatory Visit
Admission: RE | Admit: 2016-03-26 | Discharge: 2016-03-26 | Disposition: A | Discharge status: Home / Self Care | Payer: 59 | Source: Ambulatory Visit | Attending: Gastroenterology | Admitting: Gastroenterology

## 2016-03-26 ENCOUNTER — Ambulatory Visit: Payer: 59 | Admitting: Anesthesiology

## 2016-03-26 ENCOUNTER — Encounter
Admission: RE | Disposition: A | Discharge status: Home / Self Care | Payer: Self-pay | Source: Ambulatory Visit | Attending: Gastroenterology

## 2016-03-26 ENCOUNTER — Encounter: Payer: Self-pay | Admitting: *Deleted

## 2016-03-26 DIAGNOSIS — I5022 Chronic systolic (congestive) heart failure: Secondary | ICD-10-CM | POA: Diagnosis not present

## 2016-03-26 DIAGNOSIS — Z9221 Personal history of antineoplastic chemotherapy: Secondary | ICD-10-CM | POA: Diagnosis not present

## 2016-03-26 DIAGNOSIS — Z882 Allergy status to sulfonamides status: Secondary | ICD-10-CM | POA: Insufficient documentation

## 2016-03-26 DIAGNOSIS — D122 Benign neoplasm of ascending colon: Secondary | ICD-10-CM | POA: Insufficient documentation

## 2016-03-26 DIAGNOSIS — Z853 Personal history of malignant neoplasm of breast: Secondary | ICD-10-CM | POA: Diagnosis not present

## 2016-03-26 DIAGNOSIS — Z981 Arthrodesis status: Secondary | ICD-10-CM | POA: Insufficient documentation

## 2016-03-26 DIAGNOSIS — M359 Systemic involvement of connective tissue, unspecified: Secondary | ICD-10-CM | POA: Diagnosis not present

## 2016-03-26 DIAGNOSIS — Z87891 Personal history of nicotine dependence: Secondary | ICD-10-CM | POA: Diagnosis not present

## 2016-03-26 DIAGNOSIS — M069 Rheumatoid arthritis, unspecified: Secondary | ICD-10-CM | POA: Diagnosis not present

## 2016-03-26 DIAGNOSIS — D120 Benign neoplasm of cecum: Secondary | ICD-10-CM | POA: Insufficient documentation

## 2016-03-26 DIAGNOSIS — Z880 Allergy status to penicillin: Secondary | ICD-10-CM | POA: Diagnosis not present

## 2016-03-26 DIAGNOSIS — K5790 Diverticulosis of intestine, part unspecified, without perforation or abscess without bleeding: Secondary | ICD-10-CM | POA: Diagnosis not present

## 2016-03-26 DIAGNOSIS — Z79899 Other long term (current) drug therapy: Secondary | ICD-10-CM | POA: Insufficient documentation

## 2016-03-26 DIAGNOSIS — Z881 Allergy status to other antibiotic agents status: Secondary | ICD-10-CM | POA: Diagnosis not present

## 2016-03-26 DIAGNOSIS — I427 Cardiomyopathy due to drug and external agent: Secondary | ICD-10-CM | POA: Diagnosis not present

## 2016-03-26 DIAGNOSIS — I509 Heart failure, unspecified: Secondary | ICD-10-CM | POA: Diagnosis not present

## 2016-03-26 DIAGNOSIS — K635 Polyp of colon: Secondary | ICD-10-CM | POA: Diagnosis not present

## 2016-03-26 DIAGNOSIS — K5730 Diverticulosis of large intestine without perforation or abscess without bleeding: Secondary | ICD-10-CM | POA: Diagnosis not present

## 2016-03-26 DIAGNOSIS — Z9010 Acquired absence of unspecified breast and nipple: Secondary | ICD-10-CM | POA: Insufficient documentation

## 2016-03-26 DIAGNOSIS — Z1211 Encounter for screening for malignant neoplasm of colon: Secondary | ICD-10-CM | POA: Diagnosis not present

## 2016-03-26 HISTORY — PX: COLONOSCOPY WITH PROPOFOL: SHX5780

## 2016-03-26 LAB — HM COLONOSCOPY

## 2016-03-26 SURGERY — COLONOSCOPY WITH PROPOFOL
Anesthesia: General

## 2016-03-26 MED ORDER — MIDAZOLAM HCL 2 MG/2ML IJ SOLN
INTRAMUSCULAR | Status: DC | PRN
  Administered 2016-03-26: 1 mg via INTRAVENOUS

## 2016-03-26 MED ORDER — FENTANYL CITRATE (PF) 100 MCG/2ML IJ SOLN
INTRAMUSCULAR | Status: DC | PRN
  Administered 2016-03-26: 50 ug via INTRAVENOUS

## 2016-03-26 MED ORDER — PROPOFOL 10 MG/ML IV BOLUS
INTRAVENOUS | Status: DC | PRN
  Administered 2016-03-26: 80 mg via INTRAVENOUS

## 2016-03-26 MED ORDER — SODIUM CHLORIDE 0.9 % IV SOLN: INTRAVENOUS | Status: DC

## 2016-03-26 MED ORDER — SODIUM CHLORIDE 0.9 % IV SOLN
INTRAVENOUS | Status: DC
  Administered 2016-03-26: 10:00:00 via INTRAVENOUS

## 2016-03-26 MED ORDER — PROPOFOL 500 MG/50ML IV EMUL
INTRAVENOUS | Status: DC | PRN
  Administered 2016-03-26: 100 ug/kg/min via INTRAVENOUS

## 2016-03-26 NOTE — Op Note (Signed)
[facility] Patient Name: Jamiya Kellas Procedure Date: 03/26/2016 10:26 AM MRN: [id number] Account #: [id number] Date of Birth: 15-May-1951 Admit Type: Outpatient Age: 65 Room: [facility] ENDO ROOM 4 Gender: Female Note Status: Finalized Procedure:            Colonoscopy Indications:          Screening for colorectal malignant neoplasm Providers:            Lollie Sails, MD Referring MD:         Deborra Medina, MD (Referring MD) Medicines:            Monitored Anesthesia Care Complications:        No immediate complications. Procedure:            Pre-Anesthesia Assessment:                       - ASA Grade Assessment: III - A patient with severe                        systemic disease.                       After obtaining informed consent, the colonoscope was                        passed under direct vision. Throughout the procedure,                        the patient's blood pressure, pulse, and oxygen                        saturations were monitored continuously. The                        Colonoscope was introduced through the anus and                        advanced to the the cecum, identified by appendiceal                        orifice and ileocecal valve. The colonoscopy was                        performed without difficulty. The patient tolerated the                        procedure well. The quality of the bowel preparation                        was good. Findings:      Multiple small to medium diverticula were found in the sigmoid colon and       descending colon. Multiple petechiae noted throughout the sigmoid colon.       No overt diverticulitis.      A less than 1 mm polyp was found in the cecum. The polyp was sessile.       The polyp was removed with a cold biopsy forceps. Resection and       retrieval were complete.      A less than 1 mm polyp was found in the ascending colon. The polyp was  sessile. The polyp was  removed with a cold biopsy forceps. Resection and       retrieval were complete.      The digital rectal exam was normal. Impression:           - Diverticulosis in the sigmoid colon and in the                        descending colon.                       - One less than 1 mm polyp in the cecum, removed with a                        cold biopsy forceps. Resected and retrieved.                       - One less than 1 mm polyp in the ascending colon,                        removed with a cold biopsy forceps. Resected and                        retrieved. Recommendation:       - Discharge patient to home.                       - Telephone GI clinic for pathology results in 1 week.                       - Use Citrucel one tablespoon PO daily. Procedure Code(s):    --- Professional ---                       [phone], Colonoscopy, flexible; with biopsy, single or                        multiple Diagnosis Code(s):    --- Professional ---                       Z12.11, Encounter for screening for malignant neoplasm                        of colon                       D12.0, Benign neoplasm of cecum                       D12.2, Benign neoplasm of ascending colon                       K57.30, Diverticulosis of large intestine without                        perforation or abscess without bleeding CPT copyright 2016 American Medical Association. All rights reserved. The codes documented in this report are preliminary and upon coder review may  be revised to meet current compliance requirements. Lollie Sails, MD 03/26/2016 10:55:34 AM This report has been signed electronically. Number of Addenda: 0 Note Initiated On: 03/26/2016 10:26 AM Scope Withdrawal Time: 0 hours 9 minutes 48 seconds  Total Procedure Duration: 0 hours 16 minutes 59 seconds       [facility]

## 2016-03-26 NOTE — Anesthesia Preprocedure Evaluation (Addendum)
Anesthesia Evaluation  Patient identified by MRN, date of birth, ID band Patient awake    Reviewed: Allergy & Precautions, H&P , NPO status , Patient's Chart, lab work & pertinent test results, reviewed documented beta blocker date and time   Airway Mallampati: II   Neck ROM: full    Dental  (+) Poor Dentition   Pulmonary neg pulmonary ROS, former smoker,    Pulmonary exam normal        Cardiovascular +CHF  negative cardio ROS Normal cardiovascular exam     Neuro/Psych negative neurological ROS  negative psych ROS   GI/Hepatic negative GI ROS, Neg liver ROS,   Endo/Other  negative endocrine ROS  Renal/GU negative Renal ROS  negative genitourinary   Musculoskeletal   Abdominal   Peds  Hematology negative hematology ROS (+)   Anesthesia Other Findings Past Medical History: 1997: Breast cancer ([place])     Comment: s/p mastectomy, left breast, chemo No date: Cardiomyopathy No date: 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[place])     Comment: precipitated by use of Enbrel  2010 No date: Chronic systolic congestive heart failure ([facility]) No date: Collagen vascular disease ([place])     Comment: RA April 2012: Diverticulosis of colon (without mention of he*     Comment: occurred after colonoscopy, [place] No date: Menopause     Comment: secondary to chemo for BRCA No date: Rheumatoid arthritis(714.0)     Comment: on Plaquenil methotrxate Precious Reel) Past Surgical History: No date: ANKLE FUSION     Comment: right, seondary to erosive arthritis 2010: CARDIAC CATHETERIZATION     Comment: normal coronary arteries 2004: FOOT SURGERY 1997: MASTECTOMY No date: rheumatology histroy   Reproductive/Obstetrics                             Anesthesia Physical Anesthesia Plan  ASA: III  Anesthesia Plan: General   Post-op Pain Management:    Induction:   Airway Management Planned:   Additional  Equipment:   Intra-op Plan:   Post-operative Plan:   Informed Consent: I have reviewed the patients History and Physical, chart, labs and discussed the procedure including the risks, benefits and alternatives for the proposed anesthesia with the patient or authorized representative who has indicated his/her understanding and acceptance.   Dental Advisory Given  Plan Discussed with: CRNA  Anesthesia Plan Comments:        Anesthesia Quick Evaluation

## 2016-03-26 NOTE — Transfer of Care (Signed)
Immediate Anesthesia Transfer of Care Note  Patient: Helen Parks  Procedure(s) Performed: Procedure(s): COLONOSCOPY WITH PROPOFOL (N/A)  Patient Location: PACU and Endoscopy Unit  Anesthesia Type:General  Level of Consciousness: sedated and responds to stimulation  Airway & Oxygen Therapy: Patient Spontanous Breathing  Post-op Assessment: Report given to RN and Post -op Vital signs reviewed and stable  Post vital signs: Reviewed and stable  Last Vitals:  Vitals:   03/26/16 0949 03/26/16 1055  BP: 117/65 (!) 100/46  Pulse: 76 83  Resp: 14 16  Temp: 36.9 C     Last Pain:  Vitals:   03/26/16 0949  TempSrc: Tympanic         Complications: No apparent anesthesia complications

## 2016-03-26 NOTE — H&P (Signed)
Outpatient short stay form Pre-procedure 03/26/2016 10:24 AM Lollie Sails MD  Primary Physician: Dr Deborra Medina  Reason for visit:  Colonoscopy  History of present illness:  Helen Parks is a 65 year old female presenting today for colonoscopy. As a history of diverticulitis in 2012. She's had no repeat episodes since that time. Her last colonoscopy was 12/18/2010. There were no polyps at that time. There is no family history of colon cancer. He tolerated her prep well. She takes no aspirin products or blood thinning agents.    Current Facility-Administered Medications:  .  0.9 %  sodium chloride infusion, , Intravenous, Continuous, Lollie Sails, MD, Last Rate: 20 mL/hr at 03/26/16 1014 .  0.9 %  sodium chloride infusion, , Intravenous, Continuous, Lollie Sails, MD  Prescriptions Prior to Admission  Medication Sig Dispense Refill Last Dose  . Calcium 150 MG TABS Take by mouth daily.     Past Week at Unknown time  . carvedilol (COREG) 6.25 MG tablet TAKE ONE TABLET BY MOUTH 2 TIMES A DAY 180 tablet 3 03/25/2016 at 2100  . cholecalciferol (VITAMIN D) 1000 UNITS tablet Take 1,000 Units by mouth daily.   Past Week at Unknown time  . folic acid (FOLVITE) 1 MG tablet Take 1 mg by mouth daily.     Past Week at Unknown time  . furosemide (LASIX) 20 MG tablet TAKE ONE TABLET BY MOUTH 2 TIMES A DAY 180 tablet 3 03/25/2016 at Unknown time  . hydroxychloroquine (PLAQUENIL) 200 MG tablet Take 200 mg by mouth daily.   03/25/2016 at 0800  . lisinopril (PRINIVIL,ZESTRIL) 10 MG tablet TAKE ONE TABLET BY MOUTH DAILY 90 tablet 3 03/25/2016 at Unknown time  . spironolactone (ALDACTONE) 25 MG tablet TAKE 1 TABLET BY MOUTH DAILY 90 tablet 3 03/25/2016 at Unknown time  . tamsulosin (FLOMAX) 0.4 MG CAPS capsule TAKE ONE CAPSULE BY MOUTH DAILY 30 capsule 11 03/25/2016 at Unknown time  . vitamin E 400 UNIT capsule Take 400 Units by mouth daily.     Past Week at Unknown time  . ciprofloxacin (CIPRO) 500 MG tablet  Take 1 tablet (500 mg total) by mouth 2 (two) times daily. 14 tablet 0   . methotrexate (50 MG/ML) 1 G injection Inject 1 mg into the vein once a week.   03/20/2016  . metroNIDAZOLE (FLAGYL) 500 MG tablet Take 1 tablet (500 mg total) by mouth 3 (three) times daily. 21 tablet 0   . scopolamine (TRANSDERM-SCOP, 1.5 MG,) 1 MG/3DAYS Place 1 patch (1.5 mg total) onto the skin every 3 (three) days. 4 patch 0 Taking     Allergies  Allergen Reactions  . Amoxicillin     REACTION: upset stomach  . Sulfasalazine Other (See Comments)    Other reaction(s): Other (See Comments) GI upset     Past Medical History:  Diagnosis Date  . Breast cancer ([place]) 1997   s/p mastectomy, right breast, chemo  . Cardiomyopathy   . 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 Chronic systolic congestive heart failure ([place])   . Collagen vascular disease ([facility])    RA  . Diverticulosis of colon (without mention of hemorrhage) April 2012   occurred after colonoscopy, [place]  . Menopause    secondary to chemo for BRCA  . Rheumatoid arthritis(714.0)    on Plaquenil methotrxate Precious Reel)    Review of systems:      Physical Exam    Heart and  lungs: Regular rate and rhythm without rub or gallop, lungs are bilaterally clear.    HEENT: Normocephalic atraumatic eyes are anicteric    Other:     Pertinant exam for procedure: Soft nontender nondistended bowel sounds positive normoactive.    Planned proceedures: Colonoscopy and indicated procedures. I have discussed the risks benefits and complications of procedures to include not limited to bleeding, infection, perforation and the risk of sedation and the patient wishes to proceed.    Lollie Sails, MD Gastroenterology 63/28/2017  10:24 AM

## 2016-03-27 NOTE — Anesthesia Postprocedure Evaluation (Signed)
Anesthesia Post Note  Patient: ALY CALIFF  Procedure(s) Performed: Procedure(s) (LRB): COLONOSCOPY WITH PROPOFOL (N/A)  Patient location during evaluation: PACU Anesthesia Type: General Level of consciousness: awake and alert Pain management: pain level controlled Vital Signs Assessment: post-procedure vital signs reviewed and stable Respiratory status: spontaneous breathing, nonlabored ventilation, respiratory function stable and patient connected to nasal cannula oxygen Cardiovascular status: blood pressure returned to baseline and stable Postop Assessment: no signs of nausea or vomiting Anesthetic complications: no    Last Vitals:  Vitals:   03/26/16 1110 03/26/16 1120  BP: 112/65 129/63  Pulse:    Resp:    Temp:      Last Pain:  Vitals:   03/26/16 0949  TempSrc: Tympanic                 Molli Barrows

## 2016-03-29 ENCOUNTER — Encounter: Payer: Self-pay | Admitting: Gastroenterology

## 2016-03-29 LAB — SURGICAL PATHOLOGY

## 2016-04-13 ENCOUNTER — Encounter: Payer: Self-pay | Admitting: Internal Medicine

## 2016-04-19 ENCOUNTER — Ambulatory Visit
Admission: RE | Admit: 2016-04-19 | Discharge: 2016-04-19 | Disposition: A | Discharge status: Home / Self Care | Payer: 59 | Source: Ambulatory Visit | Attending: Internal Medicine | Admitting: Internal Medicine

## 2016-04-19 ENCOUNTER — Other Ambulatory Visit: Payer: Self-pay | Admitting: Internal Medicine

## 2016-04-19 DIAGNOSIS — Z1231 Encounter for screening mammogram for malignant neoplasm of breast: Secondary | ICD-10-CM

## 2016-04-21 ENCOUNTER — Encounter: Payer: Self-pay | Admitting: Internal Medicine

## 2016-05-05 ENCOUNTER — Ambulatory Visit
Admission: EM | Admit: 2016-05-05 | Discharge: 2016-05-05 | Disposition: A | Discharge status: Home / Self Care | Payer: 59 | Attending: Family Medicine | Admitting: Family Medicine

## 2016-05-05 DIAGNOSIS — R1032 Left lower quadrant pain: Secondary | ICD-10-CM

## 2016-05-05 LAB — URINALYSIS COMPLETE WITH MICROSCOPIC (ARMC ONLY)
Bacteria, UA: NONE SEEN
Bilirubin Urine: NEGATIVE
Glucose, UA: NEGATIVE mg/dL
Hgb urine dipstick: NEGATIVE
Ketones, ur: NEGATIVE mg/dL
Leukocytes, UA: NEGATIVE
Nitrite: NEGATIVE
Protein, ur: NEGATIVE mg/dL
Specific Gravity, Urine: 1.02 (ref 1.005–1.030)
pH: 7.5 (ref 5.0–8.0)

## 2016-05-05 MED ORDER — METRONIDAZOLE 500 MG PO TABS: 500.0000 mg | ORAL_TABLET | Freq: Three times a day (TID) | ORAL | 0 refills | Status: AC

## 2016-05-05 MED ORDER — CIPROFLOXACIN HCL 500 MG PO TABS: 500.0000 mg | ORAL_TABLET | Freq: Two times a day (BID) | ORAL | 0 refills | Status: DC

## 2016-05-05 NOTE — Discharge Instructions (Signed)
Start Cipro twice a day as directed. Start Flagyl 3 times a day as directed. Start liquid diet and advance as tolerated. Go to ER if pain does not improve within 48 hours or sooner if worsens.

## 2016-05-05 NOTE — ED Triage Notes (Signed)
Patient complains of LLQ pain as well as left flank pain. Patient states that symptoms started yesterday and have been worsening. Patient denies any trouble with urination, no fevers. Patient does have a history of diverticulitis.

## 2016-05-05 NOTE — ED Provider Notes (Signed)
CSN: [id number]     Arrival date & time 05/05/16  0805 History   First MD Initiated Contact with Patient 05/05/16 [phone]     Chief Complaint  Patient presents with  . Flank Pain   (Consider location/radiation/quality/duration/timing/severity/associated sxs/prior Treatment) 65 year old female presents with LLQ abdominal pain that started last night. Pain has become more severe this morning. She denies any fever, pain with urination, vaginal discharge or change in bowel habits. Last BM was yesterday and was normal. She has a history of diverticulitis - last flare up in May 2017. She had a CT scan in May 2017 which confirmed dx. She also had a colonoscopy in July 2017 which showed polyps and diverticulosis. She has not taken anything for pain yet. Pain is similar to previous episodes of diverticulitis.       Past Medical History:  Diagnosis Date  . Breast cancer ([place]) 1997   s/p mastectomy, right breast, chemo  . Cardiomyopathy   . 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 Chronic systolic congestive heart failure ([place])   . Collagen vascular disease ([facility])    RA  . Diverticulosis of colon (without mention of hemorrhage) April 2012   occurred after colonoscopy, [place]  . Menopause    secondary to chemo for BRCA  . Rheumatoid arthritis(714.0)    on Plaquenil methotrxate Precious Reel)   Past Surgical History:  Procedure Laterality Date  . ANKLE FUSION     right, seondary to erosive arthritis  . CARDIAC CATHETERIZATION  2010   normal coronary arteries  . COLONOSCOPY WITH PROPOFOL N/A 03/26/2016   Procedure: COLONOSCOPY WITH PROPOFOL;  Surgeon: Lollie Sails, MD;  Location: [facility] ENDOSCOPY;  Service: Endoscopy;  Laterality: N/A;  . FOOT SURGERY  2004  . MASTECTOMY  1997  . rheumatology histroy     Family History  Problem Relation Age of Onset  . Heart disease Father   . Cancer Paternal Aunt     ovarian  . Heart disease Maternal  Grandmother    Social History  Substance Use Topics  . Smoking status: Former Smoker    Types: Cigarettes  . Smokeless tobacco: Never Used     Comment: tobacco use- no  . Alcohol use No   OB History    No data available     Review of Systems  Constitutional: Negative for chills, fatigue, fever and unexpected weight change.  Respiratory: Negative for chest tightness and shortness of breath.   Cardiovascular: Negative for chest pain.  Gastrointestinal: Positive for abdominal pain (left lower quadrant). Negative for blood in stool, constipation, diarrhea, nausea and vomiting.  Genitourinary: Positive for flank pain. Negative for difficulty urinating, dysuria, pelvic pain, vaginal discharge and vaginal pain.  Skin: Negative.   Neurological: Negative for dizziness, weakness and headaches.    Allergies  Amoxicillin and Sulfasalazine  Home Medications   Prior to Admission medications   Medication Sig Start Date End Date Taking? Authorizing Provider  Calcium 150 MG TABS Take by mouth daily.     Yes Historical Provider, MD  carvedilol (COREG) 6.25 MG tablet TAKE ONE TABLET BY MOUTH 2 TIMES A DAY 07/28/15  Yes Wellington Hampshire, MD  cholecalciferol (VITAMIN D) 1000 UNITS tablet Take 1,000 Units by mouth daily.   Yes Historical Provider, MD  folic acid (FOLVITE) 1 MG tablet Take 1 mg by mouth daily.     Yes Historical Provider, MD  furosemide (LASIX) 20 MG  tablet TAKE ONE TABLET BY MOUTH 2 TIMES A DAY 10/10/15  Yes Crecencio Mc, MD  hydroxychloroquine (PLAQUENIL) 200 MG tablet Take 200 mg by mouth daily.   Yes Historical Provider, MD  lisinopril (PRINIVIL,ZESTRIL) 10 MG tablet TAKE ONE TABLET BY MOUTH DAILY 06/18/15  Yes Wellington Hampshire, MD  spironolactone (ALDACTONE) 25 MG tablet TAKE 1 TABLET BY MOUTH DAILY 02/12/16  Yes Wellington Hampshire, MD  tamsulosin (FLOMAX) 0.4 MG CAPS capsule TAKE ONE CAPSULE BY MOUTH DAILY 10/10/15  Yes Crecencio Mc, MD  vitamin E 400 UNIT capsule Take 400  Units by mouth daily.     Yes Historical Provider, MD  ciprofloxacin (CIPRO) 500 MG tablet Take 1 tablet (500 mg total) by mouth 2 (two) times daily. 05/05/16   Katy Apo, NP  metroNIDAZOLE (FLAGYL) 500 MG tablet Take 1 tablet (500 mg total) by mouth 3 (three) times daily. 05/05/16 05/15/16  Katy Apo, NP   Meds Ordered and Administered this Visit  Medications - No data to display  BP 113/65 (BP Location: Left Arm)   Pulse 86   Temp 98.5 F (36.9 C) (Oral)   Resp 16   Ht _0  (1.626 m)   Wt 145 lb (65.8 kg)   SpO2 100%   BMI 24.89 kg/m  No data found.   Physical Exam  Constitutional: She is oriented to person, place, and time. She appears well-developed and well-nourished. No distress.  HENT:  Mouth/Throat: Oropharynx is clear and moist.  Cardiovascular: Normal rate, regular rhythm and normal heart sounds.   Pulmonary/Chest: Effort normal and breath sounds normal.  Abdominal: Soft. Normal appearance and bowel sounds are normal. There is no hepatosplenomegaly. There is tenderness in the left lower quadrant. There is guarding. There is no rigidity, no rebound and no CVA tenderness.  Neurological: She is alert and oriented to person, place, and time.  Skin: Skin is warm and dry. Capillary refill takes less than 2 seconds.  Psychiatric: She has a normal mood and affect. Her behavior is normal. Judgment and thought content normal.    Urgent Care Course   Clinical Course    Procedures (including critical care time)  Labs Review Labs Reviewed  URINALYSIS COMPLETEWITH MICROSCOPIC ([place]) - Abnormal; Notable for the following:       Result Value   Squamous Epithelial / LPF 0-5 (*)    All other components within normal limits    Imaging Review No results found.   Visual Acuity Review  Right Eye Distance:   Left Eye Distance:   Bilateral Distance:    Right Eye Near:   Left Eye Near:    Bilateral Near:         MDM   1. Abdominal pain, LLQ (left  lower quadrant)    Reviewed urinalysis results which do not support a dx of UTI. Discussed that pain probably due to diverticulitis. Discussed performing additional labwork and imaging- patient declined additional studies at this time. Would like to trial antibiotics and if not helpful within 3 days, will follow-up with her primary care provider or will go to ER for further evaluation if pain gets more severe. Recommend start Cipro 536m twice a day as directed. Flagyl 5071mthree times a day as directed. Encouraged liquid to bland diet and advance as tolerated. Recommend she contact her Gastroenterologist for further recommendations. Otherwise, follow-up with her PCP or go to ER as planned.     AnKaty ApoNP 05/05/16 1343

## 2016-05-12 DIAGNOSIS — Z79899 Other long term (current) drug therapy: Secondary | ICD-10-CM | POA: Diagnosis not present

## 2016-05-12 DIAGNOSIS — M0579 Rheumatoid arthritis with rheumatoid factor of multiple sites without organ or systems involvement: Secondary | ICD-10-CM | POA: Diagnosis not present

## 2016-05-14 ENCOUNTER — Encounter: Payer: Self-pay | Admitting: Internal Medicine

## 2016-05-14 DIAGNOSIS — Z8719 Personal history of other diseases of the digestive system: Secondary | ICD-10-CM

## 2016-05-14 DIAGNOSIS — K5730 Diverticulosis of large intestine without perforation or abscess without bleeding: Secondary | ICD-10-CM

## 2016-05-19 ENCOUNTER — Ambulatory Visit (INDEPENDENT_AMBULATORY_CARE_PROVIDER_SITE_OTHER): Payer: 59 | Admitting: General Surgery

## 2016-05-19 ENCOUNTER — Encounter: Payer: Self-pay | Admitting: General Surgery

## 2016-05-19 VITALS — BP 106/64 | HR 68 | Resp 12 | Ht 64.0 in | Wt 149.0 lb

## 2016-05-19 DIAGNOSIS — K5732 Diverticulitis of large intestine without perforation or abscess without bleeding: Secondary | ICD-10-CM | POA: Diagnosis not present

## 2016-05-19 DIAGNOSIS — M0579 Rheumatoid arthritis with rheumatoid factor of multiple sites without organ or systems involvement: Secondary | ICD-10-CM | POA: Diagnosis not present

## 2016-05-19 DIAGNOSIS — I5022 Chronic systolic (congestive) heart failure: Secondary | ICD-10-CM | POA: Diagnosis not present

## 2016-05-19 NOTE — Progress Notes (Signed)
Patient ID: Helen Parks, female   DOB: [birth year], 65 y.o.   MRN: [id number]  Chief Complaint  Patient presents with  . Other    diverticulitis    HPI Helen Parks is a 65 y.o. female. Here today for abdominal pain related to diverticulitis. She states the constant pain is left lower quadrant since about May. The last episode of diverticulitis was 05-05-16. She has had 3 episodes in the last 4 months. The pain is sharp in the beginning then settles to an awareness.  Her prior episode was about 5 years ago.   She states she is not able to complete the antibiotics that are prescribed, "can't tolerate" due to the diarrhea .   She states she has 3 Bm's a day, soft, no bleeding. Prior to recent episodes of abdominal pain attributed to diverticulitis she would normally move her bowels once per day. She does report formed stools. No mucus or blood.  The patient has a significant medical history with cardiomyopathy developing after chemotherapy for breast cancer in the 1990s. This was asymptomatic. With a trial of biologics for management of her rheumatoid arthritis she has significant worsening with an ejection fraction as low as 25% by her report. This is now improved to the mid 41s. She is asymptomatic with daily activities.  Rheumatoid arthritis is under good control with weekly methotrexate injections and Plaquenil.  CT abdomen was 01-27-16.  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  HPI  Past Medical History:  Diagnosis Date  . Breast cancer ([place]) 1997   s/p mastectomy, right breast, chemo, BRCA negative  . Cardiomyopathy   . 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 Chronic systolic congestive heart failure ([place])   . Collagen vascular disease (H[facility])    RA  . Diverticulosis of colon (without mention of hemorrhage) April 2012   occurred after colonoscopy, [place]  . Menopause    secondary to chemo for  BRCA  . Rheumatoid arthritis ([place])   . Rheumatoid arthritis(714.0)    on Plaquenil methotrxate Precious Reel)    Past Surgical History:  Procedure Laterality Date  . ANKLE FUSION     right, seondary to erosive arthritis  . BREAST SURGERY Right 1997   Lumpectomy  . CARDIAC CATHETERIZATION  2010   normal coronary arteries  . COLONOSCOPY WITH PROPOFOL N/A 03/26/2016   Procedure: COLONOSCOPY WITH PROPOFOL;  Surgeon: Lollie Sails, MD;  Location: [facility] ENDOSCOPY;  Service: Endoscopy;  Laterality: N/A;  . FOOT SURGERY  2004  . MASTECTOMY Right 1997  . rheumatology histroy      Family History  Problem Relation Age of Onset  . Heart disease Father   . Cancer Mother     leukemia  . Cancer Paternal Aunt     ovarian  . Heart disease Maternal Grandmother     Social History Social History  Substance Use Topics  . Smoking status: Former Smoker    Types: Cigarettes  . Smokeless tobacco: Never Used     Comment: tobacco use- no  . Alcohol use Yes    Allergies  Allergen Reactions  . Amoxicillin     REACTION: upset stomach  . Sulfasalazine Other (See Comments)    Other reaction(s): Other (See Comments) GI upset    Current Outpatient Prescriptions  Medication Sig Dispense Refill  . Calcium 150 MG TABS Take by mouth daily.      Marland Kitchen  Calcium Citrate (CITRACAL PO) Take by mouth.    . carvedilol (COREG) 6.25 MG tablet TAKE ONE TABLET BY MOUTH 2 TIMES A DAY 180 tablet 3  . cholecalciferol (VITAMIN D) 1000 UNITS tablet Take 1,000 Units by mouth daily.    . folic acid (FOLVITE) 1 MG tablet Take 1 mg by mouth daily.      . furosemide (LASIX) 20 MG tablet TAKE ONE TABLET BY MOUTH 2 TIMES A DAY 180 tablet 3  . hydroxychloroquine (PLAQUENIL) 200 MG tablet Take 200 mg by mouth daily.    Marland Kitchen lisinopril (PRINIVIL,ZESTRIL) 10 MG tablet TAKE ONE TABLET BY MOUTH DAILY 90 tablet 3  . methotrexate 50 MG/2ML injection INJECT 1ML SUBCUTANEOUSLY ONCE A WEEK    . Methylcellulose, Laxative, (CITRUCEL  PO) Take by mouth daily.    . Probiotic Product (PROBIOTIC DAILY PO) Take by mouth daily.    Marland Kitchen spironolactone (ALDACTONE) 25 MG tablet TAKE 1 TABLET BY MOUTH DAILY 90 tablet 3  . tamsulosin (FLOMAX) 0.4 MG CAPS capsule TAKE ONE CAPSULE BY MOUTH DAILY 30 capsule 11  . vitamin E 400 UNIT capsule Take 400 Units by mouth daily.       No current facility-administered medications for this visit.     Review of Systems Review of Systems  Constitutional: Negative.  Negative for appetite change, chills and fever.  HENT: Negative.   Eyes: Negative.   Respiratory: Negative.   Cardiovascular: Negative.   Gastrointestinal: Positive for abdominal pain. Negative for anal bleeding, blood in stool, constipation, diarrhea, nausea, rectal pain and vomiting.  Endocrine: Negative.   Genitourinary: Negative.   Musculoskeletal: Negative.   Skin: Negative.   Allergic/Immunologic: Negative.   Neurological: Negative.   Hematological: Negative.   Psychiatric/Behavioral: Negative.     Blood pressure 106/64, pulse 68, resp. rate 12, height '5\' 4"'$  (1.626 m), weight 149 lb (67.6 kg).  Physical Exam Physical Exam  Constitutional: She is oriented to person, place, and time. She appears well-developed and well-nourished.  HENT:  Mouth/Throat: Oropharynx is clear and moist.  Eyes: Conjunctivae are normal. No scleral icterus.  Neck: Neck supple.  Cardiovascular: Normal rate, regular rhythm and normal heart sounds.   Pulses:      Dorsalis pedis pulses are 2+ on the right side, and 2+ on the left side.       Posterior tibial pulses are 2+ on the right side, and 2+ on the left side.  No lower leg edema  Pulmonary/Chest: Effort normal and breath sounds normal.  Abdominal: Soft. Normal appearance and bowel sounds are normal. There is no tenderness.    Lymphadenopathy:    She has no cervical adenopathy.  Neurological: She is alert and oriented to person, place, and time.  Skin: Skin is warm and dry.   Psychiatric: Her behavior is normal.    Data Reviewed CT scan of May 2017 (after the second episode of diverticulitis this year) was reviewed. There is thickening of the sigmoid colon. Extensive diverticulosis involving the sigmoid into the lesser extent the descending and transverse colon. Scattered right colonic diverticula are noted. No inflammatory mass or evidence of perforation is seen on that exam.  Colonoscopy of July 2017 completed by Loistine Simas, M.D. was reviewed. Remarkably good preservation of flexibility in the sigmoid colon based on diameter during insufflation. A tubular adenoma was noted in the ascending colon, less than 5 mm in diameter without atypia.  Personal conversation with Precious Reel, M.D. from rheumatology regarding the mean suppressive effects of her present  anti-inflammatory therapy (little). No recommendation for discontinuation of therapy prior to surgical intervention if needed.    Laboratory studies dated 05/12/2016 showed a hemoglobin of 11.5, modestly changed from her September 2016 exam when it was 12.4. MCV of 96. White blood cell count of 6300. Platelet count of 251,000. Differential notable for a modest increase in the neutrophils at 73%. Sedimentation rate 27. Normal liver function studies.  09/05/2015 CBC showed a hemoglobin of 13.1 with a white blood cell count of 4800.  12/29/2015 CBC showed a hemoglobin of 12.1 with a white blood cell count 5400 with a normal differential.  CBC obtained 01/27/2016 showed a hemoglobin of 12.1 with a white blood cell count 6200 with 72% polys, 20% lymphocytes.  Competency metabolic panels dated 13/03/6577 showed normal liver functions, albumin of 4.4, normal electrolytes.    Assessment    Recurrent episodes of left lower quadrant abdominal pain. Thickening of the bowel wall on the May 2017 CT with normal appearance on July 2017 colonoscopy.  Poor tolerance of antibiotic therapy with laboratory studies not  strongly supporting an infectious process during her episodes of pain.  Decreased myocardial ejection fraction secondary to previous chemotherapy.  Ongoing anti-inflammatory therapy for rheumatoid arthritis.    Plan    The patient noticed an increase in her stool frequency with the institution of Citrucel and probiotics. As she reports soft stools, and is presently not taking any antibiotics, she's been asked to discontinue both of these for a trial to see if this has any effect on her stool frequency.  Her frequent episodes of pain and report of persistent discomfort described as sharp rise in crampy raises a question of possible adhesive disease represent a running peritoneal irritation rather than an inflammatory process involving the colon. I think it will be reasonable to repeat her CT scan to see if the previously noted bowel wall thickening still present.  We reviewed the pros and cons of elective colon resection. Possibility of a diagnostic laparoscopy to assess for non-colonic sources for her pain was reviewed.  The patient's discomfort has not been so severe as to have her take even a Tylenol. While she is not one to take medications (understandable after her heart failure after chemotherapy and exacerbation with the trial of Biologics for her rheumatoid, I am a little reluctant to think about a elective colon resection for pain control when even Tylenol has not been utilized. Based on her 2017 colonoscopy in her report of pain as being sharp breath in crampy I think it's unlikely that were dealing with any stricture issue.    Repeat CT scan Stop probiotic and Citrucel  Tylenol for pain as needed for comfort  The patient is scheduled for a CT abdomen pelvis with contrast at [facility] on 05/25/16 at 4 pm. She will arrive by 3:45 pm. She will have nothing to eat or drink for 4 hours prior. She will need to pick up a prep kit. The patient is aware of date, time, and  instructions.   This information has been scribed by Karie Fetch RN, BSN,BC.    Robert Bellow 05/20/2016, 6:18 AM

## 2016-05-19 NOTE — Patient Instructions (Addendum)
The patient is aware to call back for any questions or concerns. CT scan Stop probiotic and Citrucel  Tylenol for pain as needed for comfort  The patient is scheduled for a CT abdomen pelvis with contrast at [place] on 05/25/16 at 4 pm. She will arrive by 3:45 pm. She will have nothing to eat or drink for 4 hours prior. She will need to pick up a prep kit. The patient is aware of date, time, and instructions.

## 2016-05-25 ENCOUNTER — Ambulatory Visit
Admission: RE | Admit: 2016-05-25 | Discharge: 2016-05-25 | Disposition: A | Discharge status: Home / Self Care | Payer: 59 | Source: Ambulatory Visit | Attending: General Surgery | Admitting: General Surgery

## 2016-05-25 DIAGNOSIS — R109 Unspecified abdominal pain: Secondary | ICD-10-CM | POA: Insufficient documentation

## 2016-05-25 DIAGNOSIS — K5710 Diverticulosis of small intestine without perforation or abscess without bleeding: Secondary | ICD-10-CM | POA: Insufficient documentation

## 2016-05-25 DIAGNOSIS — I700 Atherosclerosis of aorta: Secondary | ICD-10-CM | POA: Insufficient documentation

## 2016-05-25 DIAGNOSIS — K5732 Diverticulitis of large intestine without perforation or abscess without bleeding: Secondary | ICD-10-CM | POA: Diagnosis not present

## 2016-05-25 DIAGNOSIS — K5730 Diverticulosis of large intestine without perforation or abscess without bleeding: Secondary | ICD-10-CM | POA: Diagnosis not present

## 2016-05-25 MED ORDER — IOPAMIDOL (ISOVUE-300) INJECTION 61%
85.0000 mL | Freq: Once | INTRAVENOUS | Status: AC | PRN
  Administered 2016-05-25: 85 mL via INTRAVENOUS

## 2016-06-11 ENCOUNTER — Other Ambulatory Visit: Payer: Self-pay | Admitting: Cardiovascular Disease

## 2016-06-25 DIAGNOSIS — L821 Other seborrheic keratosis: Secondary | ICD-10-CM | POA: Diagnosis not present

## 2016-06-25 DIAGNOSIS — Z85828 Personal history of other malignant neoplasm of skin: Secondary | ICD-10-CM | POA: Diagnosis not present

## 2016-06-25 DIAGNOSIS — D225 Melanocytic nevi of trunk: Secondary | ICD-10-CM | POA: Diagnosis not present

## 2016-06-25 DIAGNOSIS — D2272 Melanocytic nevi of left lower limb, including hip: Secondary | ICD-10-CM | POA: Diagnosis not present

## 2016-07-02 ENCOUNTER — Other Ambulatory Visit: Payer: Self-pay | Admitting: Cardiovascular Disease

## 2016-07-07 DIAGNOSIS — H43813 Vitreous degeneration, bilateral: Secondary | ICD-10-CM | POA: Diagnosis not present

## 2016-07-07 DIAGNOSIS — M069 Rheumatoid arthritis, unspecified: Secondary | ICD-10-CM | POA: Diagnosis not present

## 2016-07-07 DIAGNOSIS — Z79899 Other long term (current) drug therapy: Secondary | ICD-10-CM | POA: Diagnosis not present

## 2016-08-11 DIAGNOSIS — I5022 Chronic systolic (congestive) heart failure: Secondary | ICD-10-CM | POA: Diagnosis not present

## 2016-08-11 DIAGNOSIS — M0579 Rheumatoid arthritis with rheumatoid factor of multiple sites without organ or systems involvement: Secondary | ICD-10-CM | POA: Diagnosis not present

## 2016-08-18 ENCOUNTER — Other Ambulatory Visit: Payer: Self-pay | Admitting: Physician Assistant

## 2016-08-19 ENCOUNTER — Ambulatory Visit: Payer: Self-pay | Admitting: Physician Assistant

## 2016-08-19 ENCOUNTER — Encounter: Payer: Self-pay | Admitting: Physician Assistant

## 2016-08-19 VITALS — BP 100/60 | HR 76 | Temp 97.7°F

## 2016-08-19 DIAGNOSIS — B001 Herpesviral vesicular dermatitis: Secondary | ICD-10-CM

## 2016-08-19 MED ORDER — MUPIROCIN 2 % EX OINT: TOPICAL_OINTMENT | CUTANEOUS | 2 refills | Status: DC

## 2016-08-19 NOTE — Progress Notes (Signed)
S: states she gets sores under her nose and they will go away if she uses bactroban ointement, the medicine she has at home is expired and would like a new rx, no fever/chills, no drainage, no lesions on lips or inside mouth, no other complaints  O: vitals wnl, nad, skin with small scabbed area under nose, no vesicles or bullous appearance, no drainage, no lesions on lips or in mouth, neck supple no lymph, lungs c t a, cv rrr  A: cold sore  P: bactroban ointment

## 2016-09-10 DIAGNOSIS — Z853 Personal history of malignant neoplasm of breast: Secondary | ICD-10-CM | POA: Diagnosis not present

## 2016-09-22 ENCOUNTER — Encounter: Payer: Self-pay | Admitting: Internal Medicine

## 2016-09-23 ENCOUNTER — Other Ambulatory Visit: Payer: Self-pay | Admitting: Internal Medicine

## 2016-09-23 DIAGNOSIS — Z9011 Acquired absence of right breast and nipple: Secondary | ICD-10-CM

## 2016-09-27 NOTE — Telephone Encounter (Signed)
Called patient and left a voice message to call office to schedule appt.

## 2016-09-27 NOTE — Telephone Encounter (Signed)
Called and left message for patient to pick up script from front office

## 2016-10-19 ENCOUNTER — Ambulatory Visit: Payer: Self-pay | Admitting: Physician Assistant

## 2016-10-19 VITALS — BP 106/70 | HR 98 | Temp 98.3°F

## 2016-10-19 DIAGNOSIS — N390 Urinary tract infection, site not specified: Secondary | ICD-10-CM

## 2016-10-19 DIAGNOSIS — R300 Dysuria: Secondary | ICD-10-CM

## 2016-10-19 LAB — POCT URINALYSIS DIPSTICK
Bilirubin, UA: NEGATIVE
Glucose, UA: NEGATIVE
Ketones, UA: NEGATIVE
Nitrite, UA: NEGATIVE
Spec Grav, UA: 1.02
Urobilinogen, UA: 1
pH, UA: 7.5

## 2016-10-19 MED ORDER — CIPROFLOXACIN HCL 250 MG PO TABS: 250.0000 mg | ORAL_TABLET | Freq: Two times a day (BID) | ORAL | 0 refills | Status: DC

## 2016-10-19 NOTE — Progress Notes (Signed)
S:  C/o uti sx for 2 days, burning, urgency, frequency, denies vaginal discharge, abdominal pain or flank pain:  Remainder ros neg  O:  Vitals wnl, nad, no cva tenderness, neuro intact, ua 2+ leuks  A: uti  P: cipro 250mg  bid x 7d, increase water intake, add cranberry juice, return if not improving in 2 -3 days, return earlier if worsening, discussed pyelonephritis sx, recheck urine when finished with antibiotic

## 2016-10-28 ENCOUNTER — Other Ambulatory Visit: Payer: Self-pay

## 2016-10-28 DIAGNOSIS — R300 Dysuria: Secondary | ICD-10-CM

## 2016-10-28 LAB — POCT URINALYSIS DIPSTICK
Bilirubin, UA: NEGATIVE
Glucose, UA: NEGATIVE
Ketones, UA: NEGATIVE
Leukocytes, UA: NEGATIVE
Nitrite, UA: NEGATIVE
Protein, UA: NEGATIVE
Spec Grav, UA: 1.015
Urobilinogen, UA: 0.2
pH, UA: 5

## 2016-10-28 NOTE — Progress Notes (Signed)
Patient in office today for recheck of urine per Manuela Schwartz. UA dipstick performed only. See result

## 2016-11-02 ENCOUNTER — Other Ambulatory Visit (HOSPITAL_COMMUNITY)
Admission: RE | Admit: 2016-11-02 | Discharge: 2016-11-02 | Disposition: A | Discharge status: Home / Self Care | Payer: 59 | Source: Ambulatory Visit | Attending: Internal Medicine | Admitting: Internal Medicine

## 2016-11-02 ENCOUNTER — Encounter: Payer: Self-pay | Admitting: Internal Medicine

## 2016-11-02 ENCOUNTER — Ambulatory Visit (INDEPENDENT_AMBULATORY_CARE_PROVIDER_SITE_OTHER): Payer: 59 | Admitting: Internal Medicine

## 2016-11-02 VITALS — BP 82/60 | HR 84 | Resp 16 | Ht 64.0 in | Wt 153.0 lb

## 2016-11-02 DIAGNOSIS — Z1151 Encounter for screening for human papillomavirus (HPV): Secondary | ICD-10-CM | POA: Diagnosis not present

## 2016-11-02 DIAGNOSIS — Z0000 Encounter for general adult medical examination without abnormal findings: Secondary | ICD-10-CM

## 2016-11-02 DIAGNOSIS — R5383 Other fatigue: Secondary | ICD-10-CM

## 2016-11-02 DIAGNOSIS — E7800 Pure hypercholesterolemia, unspecified: Secondary | ICD-10-CM

## 2016-11-02 DIAGNOSIS — K449 Diaphragmatic hernia without obstruction or gangrene: Secondary | ICD-10-CM

## 2016-11-02 DIAGNOSIS — R1032 Left lower quadrant pain: Secondary | ICD-10-CM | POA: Diagnosis not present

## 2016-11-02 DIAGNOSIS — E559 Vitamin D deficiency, unspecified: Secondary | ICD-10-CM

## 2016-11-02 DIAGNOSIS — Z853 Personal history of malignant neoplasm of breast: Secondary | ICD-10-CM

## 2016-11-02 DIAGNOSIS — Z79899 Other long term (current) drug therapy: Secondary | ICD-10-CM | POA: Diagnosis not present

## 2016-11-02 DIAGNOSIS — Z1231 Encounter for screening mammogram for malignant neoplasm of breast: Secondary | ICD-10-CM | POA: Diagnosis not present

## 2016-11-02 DIAGNOSIS — Z01419 Encounter for gynecological examination (general) (routine) without abnormal findings: Secondary | ICD-10-CM | POA: Diagnosis not present

## 2016-11-02 DIAGNOSIS — Z23 Encounter for immunization: Secondary | ICD-10-CM | POA: Diagnosis not present

## 2016-11-02 DIAGNOSIS — Z124 Encounter for screening for malignant neoplasm of cervix: Secondary | ICD-10-CM

## 2016-11-02 DIAGNOSIS — Z1239 Encounter for other screening for malignant neoplasm of breast: Secondary | ICD-10-CM

## 2016-11-02 LAB — CBC WITH DIFFERENTIAL/PLATELET
Basophils Absolute: 0.1 10*3/uL (ref 0.0–0.1)
Basophils Relative: 1.2 % (ref 0.0–3.0)
Eosinophils Absolute: 0.1 10*3/uL (ref 0.0–0.7)
Eosinophils Relative: 1.9 % (ref 0.0–5.0)
HCT: 35.6 % — ABNORMAL LOW (ref 36.0–46.0)
Hemoglobin: 12.3 g/dL (ref 12.0–15.0)
Lymphocytes Relative: 27.8 % (ref 12.0–46.0)
Lymphs Abs: 1.6 10*3/uL (ref 0.7–4.0)
MCHC: 34.5 g/dL (ref 30.0–36.0)
MCV: 94.9 fl (ref 78.0–100.0)
Monocytes Absolute: 0.4 10*3/uL (ref 0.1–1.0)
Monocytes Relative: 7.7 % (ref 3.0–12.0)
Neutro Abs: 3.6 10*3/uL (ref 1.4–7.7)
Neutrophils Relative %: 61.4 % (ref 43.0–77.0)
Platelets: 242 10*3/uL (ref 150.0–400.0)
RBC: 3.75 Mil/uL — ABNORMAL LOW (ref 3.87–5.11)
RDW: 13.9 % (ref 11.5–15.5)
WBC: 5.8 10*3/uL (ref 4.0–10.5)

## 2016-11-02 LAB — COMPREHENSIVE METABOLIC PANEL
ALT: 16 U/L (ref 0–35)
AST: 23 U/L (ref 0–37)
Albumin: 4.3 g/dL (ref 3.5–5.2)
Alkaline Phosphatase: 54 U/L (ref 39–117)
BUN: 18 mg/dL (ref 6–23)
CO2: 33 mEq/L — ABNORMAL HIGH (ref 19–32)
Calcium: 9.3 mg/dL (ref 8.4–10.5)
Chloride: 101 mEq/L (ref 96–112)
Creatinine, Ser: 0.76 mg/dL (ref 0.40–1.20)
GFR: 81.53 mL/min (ref >60.00)
Glucose, Bld: 89 mg/dL (ref 70–99)
Potassium: 4.1 mEq/L (ref 3.5–5.1)
Sodium: 140 mEq/L (ref 135–145)
Total Bilirubin: 0.4 mg/dL (ref 0.2–1.2)
Total Protein: 6.8 g/dL (ref 6.0–8.3)

## 2016-11-02 LAB — LIPID PANEL
Cholesterol: 165 mg/dL (ref 0–200)
HDL: 55.8 mg/dL (ref >39.00)
LDL Cholesterol: 87 mg/dL (ref 0–99)
NonHDL: 109.05
Total CHOL/HDL Ratio: 3
Triglycerides: 110 mg/dL (ref 0.0–149.0)
VLDL: 22 mg/dL (ref 0.0–40.0)

## 2016-11-02 LAB — LDL CHOLESTEROL, DIRECT: Direct LDL: 87 mg/dL

## 2016-11-02 LAB — VITAMIN D 25 HYDROXY (VIT D DEFICIENCY, FRACTURES): VITD: 26.7 ng/mL — ABNORMAL LOW (ref 30.00–100.00)

## 2016-11-02 LAB — TSH: TSH: 2.17 u[IU]/mL (ref 0.35–4.50)

## 2016-11-02 LAB — URINALYSIS, MICROSCOPIC ONLY: RBC / HPF: NONE SEEN (ref 0–?)

## 2016-11-02 NOTE — Progress Notes (Signed)
Patient ID: Helen Parks, female    DOB: 1951-06-12  Age: 66 y.o. MRN: [id number]  The patient is here for annual  examination and management of other chronic and acute problems.   last PAP  smear  June 2015  Colonoscopy July 2017 Screening left Mammogram August 2017   The risk factors are reflected in the social history.  The roster of all physicians providing medical care to patient - is listed in the Snapshot section of the chart.  Activities of daily living:  The patient is 100% independent in all ADLs: dressing, toileting, feeding as well as independent mobility  Home safety : The patient has smoke detectors in the home. They wear seatbelts.  There are no firearms at home. There is no violence in the home.   There is no risks for hepatitis, STDs or HIV. There is no   history of blood transfusion. They have no travel history to infectious disease endemic areas of the world.  The patient has seen their dentist in the last six month. They have seen their eye doctor in the last year.    They do not  have excessive sun exposure. Discussed the need for sun protection: hats, long sleeves and use of sunscreen if there is significant sun exposure.   Diet: the importance of a healthy diet is discussed. They do have a healthy diet.  The benefits of regular aerobic exercise were discussed. She walks 4 times per week ,  20 minutes.   Depression screen: there are no signs or vegative symptoms of depression- irritability, change in appetite, anhedonia, sadness/tearfullness.  The following portions of the patient's history were reviewed and updated as appropriate: allergies, current medications, past family history, past medical history,  past surgical history, past social history  and problem list.  Visual acuity was not assessed per patient preference since she has regular follow up with her ophthalmologist. Hearing and body mass index were assessed and reviewed.   During the course of the  visit the patient was educated and counseled about appropriate screening and preventive services including : fall prevention , diabetes screening, nutrition counseling, colorectal cancer screening, and recommended immunizations.    CC: The primary encounter diagnosis was LLQ pain. Diagnoses of Breast cancer screening, Pure hypercholesterolemia, Long-term use of high-risk medication, Vitamin D deficiency, Fatigue, unspecified type, Cervical cancer screening, Need for Tdap vaccination, Hiatal hernia, History of right breast cancer, and Visit for preventive health examination were also pertinent to this visit.  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   She has a past surgical history that includes rheumatology histroy; Mastectomy (Right, 1997); Foot surgery (2004); Ankle Fusion; Cardiac catheterization (2010); Colonoscopy with propofol (N/A, 03/26/2016); and Breast surgery (Right, 1997).   Her family history includes Cancer in her mother and paternal aunt; Heart disease in her father and maternal grandmother.She reports that she has quit smoking. Her smoking use included Cigarettes. She has never used smokeless tobacco. She reports that she drinks alcohol. She reports that she does not use drugs.  Outpatient Medications Prior to Visit  Medication Sig Dispense Refill  . Calcium 150 MG TABS Take by mouth daily.      . Calcium Citrate (CITRACAL PO) Take by mouth.    . carvedilol (COREG) 6.25 MG tablet  TAKE ONE TABLET BY MOUTH 2 TIMES A DAY 180 tablet 3  . cholecalciferol (VITAMIN D) 1000 UNITS tablet Take 1,000 Units by mouth daily.    . folic acid (FOLVITE) 1 MG tablet Take 1 mg by mouth daily.       . furosemide (LASIX) 20 MG tablet TAKE ONE TABLET BY MOUTH 2 TIMES A DAY 180 tablet 3  . hydroxychloroquine (PLAQUENIL) 200 MG tablet Take 200 mg by mouth daily.    Marland Kitchen lisinopril (PRINIVIL,ZESTRIL) 10 MG tablet TAKE ONE TABLET BY MOUTH DAILY 90 tablet 3  . methotrexate 50 MG/2ML injection INJECT 1ML SUBCUTANEOUSLY ONCE A WEEK    . mupirocin ointment (BACTROBAN) 2 % Apply bid 22 g 2  . spironolactone (ALDACTONE) 25 MG tablet TAKE 1 TABLET BY MOUTH DAILY 90 tablet 3  . tamsulosin (FLOMAX) 0.4 MG CAPS capsule TAKE ONE CAPSULE BY MOUTH DAILY 30 capsule 11  . vitamin E 400 UNIT capsule Take 400 Units by mouth daily.      . ciprofloxacin (CIPRO) 250 MG tablet Take 1 tablet (250 mg total) by mouth 2 (two) times daily. 14 tablet 0  . Methylcellulose, Laxative, (CITRUCEL PO) Take by mouth daily.    . Probiotic Product (PROBIOTIC DAILY PO) Take by mouth daily.     No facility-administered medications prior to visit.     Review of Systems   Patient denies headache, fevers, malaise, unintentional weight loss, skin rash, eye pain, sinus congestion and sinus pain, sore throat, dysphagia,  hemoptysis , cough, dyspnea, wheezing, chest pain, palpitations, orthopnea, edema, abdominal pain, nausea, melena, diarrhea, constipation, flank pain, dysuria, hematuria, urinary  Frequency, nocturia, numbness, tingling, seizures,  Focal weakness, Loss of consciousness,  Tremor, insomnia, depression, anxiety, and suicidal ideation.      Objective:  BP (!) 82/60   Pulse 84   Resp 16   Ht '5\' 4"'$  (1.626 m)   Wt 153 lb (69.4 kg)   SpO2 97%   BMI 26.26 kg/m   Physical Exam  General appearance: alert, cooperative and appears stated age Head: Normocephalic, without obvious abnormality, atraumatic Eyes: conjunctivae/corneas clear. PERRL, EOM's intact. Fundi benign. Ears: normal TM's and external ear canals both ears Nose: Nares normal. Septum midline. Mucosa normal. No drainage or sinus tenderness. Throat: lips,  mucosa, and tongue normal; teeth and gums normal Neck: no adenopathy, no carotid bruit, no JVD, supple, symmetrical, trachea midline and thyroid not enlarged, symmetric, no tenderness/mass/nodules Lungs: clear to auscultation bilaterally Breasts: right mastectomy, left breast  no masses or tenderness Heart: regular rate and rhythm, S1, S2 normal, no murmur, click, rub or gallop Abdomen: soft, non-tender; bowel sounds normal; no masses,  no organomegaly Extremities: extremities normal, atraumatic, no cyanosis or edema Pulses: 2+ and symmetric Skin: Skin color, texture, turgor normal. No rashes or lesions Neurologic: Alert and oriented X 3, normal strength and tone. Normal symmetric reflexes. Normal coordination and gait.     Assessment & Plan:   Problem List Items Addressed This Visit    Fatigue   Relevant Orders   TSH (Completed)   Hiatal hernia    Found on dg esophagus July 2016      History of right breast cancer    S/p modified radical mastectomy 1997. No recurrence.  Has met with plastic surgeon in so to consider reconstruction.  No plans made.  Screening 3d mammogram left breast is  due In August 2017      Visit for preventive health examination    Annual comprehensive preventive  exam was done as well as an evaluation and management of chronic conditions .  During the course of the visit the patient was educated and counseled about appropriate screening and preventive services including :  diabetes screening, lipid analysis with projected  10 year  risk for CAD , nutrition counseling, breast, cervical and colorectal cancer screening, and recommended immunizations.  Printed recommendations for health maintenance screenings was given.       Other Visit Diagnoses    LLQ pain    -  Primary   Relevant Orders   Urine Microscopic Only (Completed)   Breast cancer screening       Relevant Orders   MM SCREENING BREAST TOMO BILATERAL   Pure hypercholesterolemia       Relevant Orders    Lipid panel (Completed)   LDL cholesterol, direct (Completed)   Long-term use of high-risk medication       Relevant Orders   Comprehensive metabolic panel (Completed)   CBC with Differential/Platelet (Completed)   Vitamin D deficiency       Relevant Orders   VITAMIN D 25 Hydroxy (Vit-D Deficiency, Fractures) (Completed)   Cervical cancer screening       Relevant Orders   Cytology - PAP   Need for Tdap vaccination       Relevant Orders   Tdap vaccine greater than or equal to 7yo IM (Completed)      I have discontinued Helen Parks's Probiotic Product (PROBIOTIC DAILY PO), (Methylcellulose, Laxative, (CITRUCEL PO)), and ciprofloxacin. I am also having her maintain her Calcium, folic acid, vitamin E, hydroxychloroquine, cholecalciferol, furosemide, tamsulosin, spironolactone, methotrexate, Calcium Citrate (CITRACAL PO), lisinopril, carvedilol, and mupirocin ointment.  No orders of the defined types were placed in this encounter.   Medications Discontinued During This Encounter  Medication Reason  . ciprofloxacin (CIPRO) 250 MG tablet Completed Course  . Methylcellulose, Laxative, (CITRUCEL PO) Patient has not taken in last 30 days  . Probiotic Product (PROBIOTIC DAILY PO) Patient has not taken in last 30 days    Follow-up: No Follow-up on file.   Crecencio Mc, MD

## 2016-11-02 NOTE — Progress Notes (Signed)
Pre visit review using our clinic review tool, if applicable. No additional management support is needed unless otherwise documented below in the visit note. 

## 2016-11-03 NOTE — Assessment & Plan Note (Signed)
Found on dg esophagus July 2016

## 2016-11-03 NOTE — Assessment & Plan Note (Addendum)
Annual comprehensive preventive exam was done as well as an evaluation and management of chronic conditions .  During the course of the visit the patient was educated and counseled about appropriate screening and preventive services including :  diabetes screening, lipid analysis with projected  10 year  risk for CAD , nutrition counseling, breast, cervical and colorectal cancer screening, and recommended immunizations.  Printed recommendations for health maintenance screenings was given 

## 2016-11-03 NOTE — Assessment & Plan Note (Signed)
S/p modified radical mastectomy 1997. No recurrence.  Has met with plastic surgeon in so to consider reconstruction.  No plans made.  Screening 3d mammogram left breast is  due In August 2017

## 2016-11-05 LAB — CYTOLOGY - PAP
Diagnosis: NEGATIVE
HPV: NOT DETECTED

## 2016-11-07 ENCOUNTER — Encounter: Payer: Self-pay | Admitting: Internal Medicine

## 2016-11-10 DIAGNOSIS — M0579 Rheumatoid arthritis with rheumatoid factor of multiple sites without organ or systems involvement: Secondary | ICD-10-CM | POA: Diagnosis not present

## 2016-11-10 DIAGNOSIS — I5022 Chronic systolic (congestive) heart failure: Secondary | ICD-10-CM | POA: Diagnosis not present

## 2016-11-17 ENCOUNTER — Other Ambulatory Visit: Payer: Self-pay | Admitting: Internal Medicine

## 2016-11-19 ENCOUNTER — Encounter: Payer: Self-pay | Admitting: Cardiovascular Disease

## 2016-11-19 ENCOUNTER — Ambulatory Visit (INDEPENDENT_AMBULATORY_CARE_PROVIDER_SITE_OTHER): Payer: 59 | Admitting: Cardiovascular Disease

## 2016-11-19 VITALS — BP 98/60 | HR 82 | Ht 64.0 in | Wt 158.0 lb

## 2016-11-19 DIAGNOSIS — I5022 Chronic systolic (congestive) heart failure: Secondary | ICD-10-CM | POA: Diagnosis not present

## 2016-11-19 DIAGNOSIS — I340 Nonrheumatic mitral (valve) insufficiency: Secondary | ICD-10-CM

## 2016-11-19 NOTE — Patient Instructions (Signed)
Medication Instructions: Continue same medications.   Labwork: None.   Procedures/Testing: None.   Follow-Up: 1 year with Dr. Julis Haubner.   Any Additional Special Instructions Will Be Listed Below (If Applicable).     If you need a refill on your cardiac medications before your next appointment, please call your pharmacy.   

## 2016-11-19 NOTE — Progress Notes (Signed)
Cardiology Office Note   Date:  11/19/2016   ID:  DAREN DOSWELL, DOB 1950/09/15, MRN [id number]  PCP:  Crecencio Mc, MD  Cardiologist:   Kathlyn Sacramento, MD   Chief Complaint  Patient presents with  . other    12 month follow up. Meds reviewed by the pt. verbally. "doing well."       History of Present Illness: Helen Parks is a 66 y.o. 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 She had normal coronary arteries by catheterization in 2010, history of rheumatoid arthritis, breast cancer, status post mastectomy and chemotherapy in 1997 with no radiation therapy with ejection fraction noted to be 30% in 2005.   She had an episode of heart failure in 2010 which was likely triggered by the use of Enbrel for rheumatoid arthritis.  Her initial ejection fraction was 25-30% which gradually improved to 40-45% in June 2016. There was moderate mitral regurgitation with no evidence of pulmonary hypertension. She has been doing extremely well with no chest pain or worsening dyspnea. She has chronic leg edema that usually worsens at the end of the day. She has been noted to have relatively low blood pressure readings recently but she denies dizziness, syncope or presyncope.    Past Medical History:  Diagnosis Date  . Breast cancer ([place]) 1997   s/p mastectomy, right breast, chemo, BRCA negative  . Cardiomyopathy   . 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[place])    precipitated by use of Enbrel  2010  . Chronic systolic congestive heart failure ([place])   . Collagen vascular disease (HCC)    RA  . Diverticulosis of colon (without mention of hemorrhage) April 2012   occurred after colonoscopy, [place]  . Menopause    secondary to chemo for BRCA  . Rheumatoid arthritis ([place])   . Rheumatoid arthritis(714.0)    on Plaquenil methotrxate [facility])    Past Surgical History:  Procedure Laterality Date  .  ANKLE FUSION     right, seondary to erosive arthritis  . BREAST SURGERY Right 1997   Lumpectomy  . CARDIAC CATHETERIZATION  2010   normal coronary arteries  . COLONOSCOPY WITH PROPOFOL N/A 03/26/2016   Procedure: COLONOSCOPY WITH PROPOFOL;  Surgeon: Lollie Sails, MD;  Location: [facility] ENDOSCOPY;  Service: Endoscopy;  Laterality: N/A;  . FOOT SURGERY  2004  . MASTECTOMY Right 1997  . rheumatology histroy       Current Outpatient Prescriptions  Medication Sig Dispense Refill  . Calcium 150 MG TABS Take by mouth daily.      . Calcium Citrate (CITRACAL PO) Take by mouth.    . carvedilol (COREG) 6.25 MG tablet TAKE ONE TABLET BY MOUTH 2 TIMES A DAY 180 tablet 3  . cholecalciferol (VITAMIN D) 1000 UNITS tablet Take 2,000 Units by mouth daily.     . folic acid (FOLVITE) 1 MG tablet Take 1 mg by mouth daily.      . furosemide (LASIX) 20 MG tablet TAKE ONE TABLET BY MOUTH 2 TIMES A DAY 180 tablet 0  . hydroxychloroquine (PLAQUENIL) 200 MG tablet Take 200 mg by mouth daily.    Marland Kitchen lisinopril (PRINIVIL,ZESTRIL) 10 MG tablet TAKE ONE TABLET BY MOUTH DAILY 90 tablet 3  . methotrexate 50 MG/2ML injection INJECT 1ML SUBCUTANEOUSLY ONCE A WEEK    . mupirocin ointment (BACTROBAN) 2 % Apply bid 22 g 2  . spironolactone (ALDACTONE) 25 MG tablet  TAKE 1 TABLET BY MOUTH DAILY 90 tablet 3  . tamsulosin (FLOMAX) 0.4 MG CAPS capsule TAKE ONE CAPSULE BY MOUTH DAILY 30 capsule 11  . vitamin E 400 UNIT capsule Take 400 Units by mouth daily.       No current facility-administered medications for this visit.     Allergies:   Amoxicillin; Sulfasalazine; and Latex    Social History:  The patient  reports that she has quit smoking. Her smoking use included Cigarettes. She has never used smokeless tobacco. She reports that she drinks alcohol. She reports that she does not use drugs.   Family History:  The patient's family history includes Cancer in her mother and paternal aunt; Heart disease in her father and  maternal grandmother.    ROS:  Please see the history of present illness.   Otherwise, review of systems are positive for none.   All other systems are reviewed and negative.    PHYSICAL EXAM: VS:  BP 98/60 (BP Location: Left Arm, Patient Position: Sitting, Cuff Size: Normal)   Pulse 82   Ht _0  (1.626 m)   Wt 158 lb (71.7 kg)   BMI 27.12 kg/m  , BMI Body mass index is 27.12 kg/m. GEN: Well nourished, well developed, in no acute distress HEENT: normal Neck: no JVD, carotid bruits, or masses Cardiac: RRR; no murmurs, rubs, or gallops,no edema  Respiratory:  clear to auscultation bilaterally, normal work of breathing GI: soft, nontender, nondistended, + BS MS: no deformity or atrophy Skin: warm and dry, no rash Neuro:  Strength and sensation are intact Psych: euthymic mood, full affect   EKG:  EKG is ordered today. The ekg ordered today demonstrates normal sinus rhythm with low voltage and no significant ST or T wave changes.   Recent Labs: 11/02/2016: ALT 16; BUN 18; Creatinine, Ser 0.76; Hemoglobin 12.3; Platelets 242.0; Potassium 4.1; Sodium 140; TSH 2.17    Lipid Panel    Component Value Date/Time   CHOL 165 11/02/2016 1520   TRIG 110.0 11/02/2016 1520   TRIG 82 02/04/2010   HDL 55.80 11/02/2016 1520   CHOLHDL 3 11/02/2016 1520   VLDL 22.0 11/02/2016 1520   LDLCALC 87 11/02/2016 1520   LDLDIRECT 87.0 11/02/2016 1520      Wt Readings from Last 3 Encounters:  11/19/16 158 lb (71.7 kg)  11/02/16 153 lb (69.4 kg)  05/19/16 149 lb (67.6 kg)        ASSESSMENT AND PLAN:  1.  Chronic systolic heart failure: Due to chemotherapy-induced cardiomyopathy. Most recent ejection fraction was 40-45%. There is no evidence of volume overload on Lasix 20 mg twice daily. She is not a candidate for Entresto given that most recent ejection fraction was about 40% and also due to chronic hypotension. I reviewed recent labs which overall were unremarkable.  2. Rheumatoid  arthritis: This seems to be stable on current medications.  3. Hypotension: She is overall asymptomatic and thus I recommend no change in her medications.  4. Moderate mitral regurgitation: No heart murmurs by physical exam. I recommend repeat echocardiogram next year.   Disposition:   FU with me in 1 year  Signed,  Kathlyn Sacramento, MD  11/19/2016 10:03 AM    [place]

## 2016-12-20 ENCOUNTER — Other Ambulatory Visit: Payer: Self-pay | Admitting: Internal Medicine

## 2016-12-21 NOTE — Telephone Encounter (Signed)
Refilled: 10/10/15 Last OV: 11/02/16 Last Labs: 11/02/16 Future OV: none Please advise?

## 2017-02-03 ENCOUNTER — Encounter: Payer: Self-pay | Admitting: Family

## 2017-02-03 ENCOUNTER — Ambulatory Visit (INDEPENDENT_AMBULATORY_CARE_PROVIDER_SITE_OTHER): Payer: 59 | Admitting: Family

## 2017-02-03 VITALS — BP 110/62 | HR 85 | Temp 97.5°F | Ht 64.0 in | Wt 149.6 lb

## 2017-02-03 DIAGNOSIS — R197 Diarrhea, unspecified: Secondary | ICD-10-CM | POA: Diagnosis not present

## 2017-02-03 NOTE — Progress Notes (Signed)
 Subjective:    Patient ID: Helen Parks, female    DOB: 05/06/1951, 65 y.o.   MRN: [id number]  CC: Helen Parks is a 65 y.o. female who presents today for an acute visit.    HPI: CC: diarrhea and vomiting x 6 days, waxing and waning. Vomiting resolved. Notes 4-5 episodes of diarrhea today. No foul odor.  Feels her stomach 'bubbling' then diarrhea comes. Felt tactile fever 6 days ago, resolved/  Endorses stomach cramping, resolved 2 days ago.  Loss of appetite.  'doesn't feel like my diverticulitis'  Has been eating bland diet. Drinking lots of fluids. No new foods. Husband and grandchildren had similar symptoms this week  Hasn't tried any medication No  chills, blood in stool  H/o  Diverticulitis; follows with [place]; stopped citrucel and probiotics as thought perhaps triggering diverticultis  No abdominal surgeries.    Colonoscopy 2017- tubular adenoma HISTORY:  Past Medical History:  Diagnosis Date  . Breast cancer (HCC) 1997   s/p mastectomy, right breast, chemo, BRCA negative  . Cardiomyopathy   . 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 Chronic systolic congestive heart failure (HCC)   . Collagen vascular disease (HCC)    RA  . Diverticulosis of colon (without mention of hemorrhage) April 2012   occurred after colonoscopy, [place]  . Menopause    secondary to chemo for BRCA  . Rheumatoid arthritis (HCC)   . Rheumatoid arthritis(714.0)    on Plaquenil methotrxate ([facility])   Past Surgical History:  Procedure Laterality Date  . ANKLE FUSION     right, seondary to erosive arthritis  . BREAST SURGERY Right 1997   Lumpectomy  . CARDIAC CATHETERIZATION  2010   normal coronary arteries  . COLONOSCOPY WITH PROPOFOL N/A 03/26/2016   Procedure: COLONOSCOPY WITH PROPOFOL;  Surgeon: Martin U Skulskie, MD;  Location: [facility] ENDOSCOPY;  Service: Endoscopy;  Laterality: N/A;  . FOOT SURGERY  2004  . MASTECTOMY Right 1997   . rheumatology histroy     Family History  Problem Relation Age of Onset  . Heart disease Father   . Cancer Mother        leukemia  . Cancer Paternal Aunt        ovarian  . Heart disease Maternal Grandmother     Allergies: Amoxicillin; Sulfasalazine; and Latex Current Outpatient Prescriptions on File Prior to Visit  Medication Sig Dispense Refill  . Calcium 150 MG TABS Take by mouth daily.      . Calcium Citrate (CITRACAL PO) Take by mouth.    . carvedilol (COREG) 6.25 MG tablet TAKE ONE TABLET BY MOUTH 2 TIMES A DAY 180 tablet 3  . cholecalciferol (VITAMIN D) 1000 UNITS tablet Take 2,000 Units by mouth daily.     . folic acid (FOLVITE) 1 MG tablet Take 1 mg by mouth daily.      . furosemide (LASIX) 20 MG tablet TAKE ONE TABLET BY MOUTH 2 TIMES A DAY 180 tablet 0  . hydroxychloroquine (PLAQUENIL) 200 MG tablet Take 200 mg by mouth daily.    . lisinopril (PRINIVIL,ZESTRIL) 10 MG tablet TAKE ONE TABLET BY MOUTH DAILY 90 tablet 3  . methotrexate 50 MG/2ML injection INJECT 1ML SUBCUTANEOUSLY ONCE A WEEK    . mupirocin ointment (BACTROBAN) 2 % Apply bid 22 g 2  . spironolactone (ALDACTONE) 25 MG tablet TAKE 1 TABLET BY MOUTH DAILY 90 tablet 3  . tamsulosin (FLOMAX) 0.4 MG   CAPS capsule TAKE ONE CAPSULE BY MOUTH DAILY 30 capsule 12  . vitamin E 400 UNIT capsule Take 400 Units by mouth daily.       No current facility-administered medications on file prior to visit.     Social History  Substance Use Topics  . Smoking status: Former Smoker    Types: Cigarettes  . Smokeless tobacco: Never Used     Comment: tobacco use- no  . Alcohol use Yes    Review of Systems  Constitutional: Negative for chills and fever.  Respiratory: Negative for cough.   Cardiovascular: Negative for chest pain and palpitations.  Gastrointestinal: Positive for diarrhea and vomiting. Negative for abdominal distention, abdominal pain, blood in stool, constipation and nausea.  Genitourinary: Negative for  dysuria.      Objective:    BP 110/62   Pulse 85   Temp 97.5 F (36.4 C) (Oral)   Ht 5' 4" (1.626 m)   Wt 149 lb 9.6 oz (67.9 kg)   SpO2 96%   BMI 25.68 kg/m    Physical Exam  Constitutional: She appears well-developed and well-nourished.  Eyes: Conjunctivae are normal.  Cardiovascular: Normal rate, regular rhythm, normal heart sounds and normal pulses.   Pulmonary/Chest: Effort normal and breath sounds normal. She has no wheezes. She has no rhonchi. She has no rales.  Abdominal: Soft. Normal appearance and bowel sounds are normal. She exhibits no distension, no fluid wave, no ascites and no mass. There is no tenderness. There is no rigidity, no rebound, no guarding and no CVA tenderness.  Neurological: She is alert.  Skin: Skin is warm and dry.  Psychiatric: She has a normal mood and affect. Her speech is normal and behavior is normal. Thought content normal.  Vitals reviewed.      Assessment & Plan:   1. Diarrhea, unspecified type Afebrile. Working diagnosis of bowel gastritis. Normotensive. No abdominal pain. Vomiting has resolved. Reassured by benign abdominal exam. Offered patient CT scan of abdomen however she played declines this does not feel like diverticulitis has in the past . Advised her husband to remain very vigilant for symptoms and to let me know if worsening.  Advised cautious use of imodium while we await stool culture.   - C. difficile GDH and Toxin A/B; Future - Stool culture; Future    I am having Ms. Jardin maintain her Calcium, folic acid, vitamin E, hydroxychloroquine, cholecalciferol, spironolactone, methotrexate, Calcium Citrate (CITRACAL PO), lisinopril, carvedilol, mupirocin ointment, furosemide, and tamsulosin.   No orders of the defined types were placed in this encounter.   Return precautions given.   Risks, benefits, and alternatives of the medications and treatment plan prescribed today were discussed, and patient expressed  understanding.   Education regarding symptom management and diagnosis given to patient on AVS.  Continue to follow with Tullo, Teresa L, MD for routine health maintenance.   Kimberly T Brosious and I agreed with plan.   Margaret Arnett, FNP  

## 2017-02-03 NOTE — Progress Notes (Signed)
Pre visit review using our clinic review tool, if applicable. No additional management support is needed unless otherwise documented below in the visit note. 

## 2017-02-03 NOTE — Patient Instructions (Addendum)
As discussed, suspect viral gastroenteritis. Pending stool cultures. As discussed in may use Imodium however do this cautiously as the body is trying to shed infection with diarrhea as well. Continue drinking plenty of fluids as you are doing Let us know if you begin to have abdominal pain or new symptoms develop, especially due to history of diverticulitis.

## 2017-02-04 ENCOUNTER — Encounter: Payer: Self-pay | Admitting: Family

## 2017-02-07 ENCOUNTER — Other Ambulatory Visit: Payer: Self-pay | Admitting: Cardiovascular Disease

## 2017-02-07 ENCOUNTER — Other Ambulatory Visit: Payer: Self-pay | Admitting: Internal Medicine

## 2017-02-09 DIAGNOSIS — M0579 Rheumatoid arthritis with rheumatoid factor of multiple sites without organ or systems involvement: Secondary | ICD-10-CM | POA: Diagnosis not present

## 2017-02-09 DIAGNOSIS — I5022 Chronic systolic (congestive) heart failure: Secondary | ICD-10-CM | POA: Diagnosis not present

## 2017-02-16 DIAGNOSIS — M79642 Pain in left hand: Secondary | ICD-10-CM | POA: Diagnosis not present

## 2017-02-16 DIAGNOSIS — M79672 Pain in left foot: Secondary | ICD-10-CM | POA: Diagnosis not present

## 2017-02-16 DIAGNOSIS — M0579 Rheumatoid arthritis with rheumatoid factor of multiple sites without organ or systems involvement: Secondary | ICD-10-CM | POA: Diagnosis not present

## 2017-02-16 DIAGNOSIS — M79641 Pain in right hand: Secondary | ICD-10-CM | POA: Diagnosis not present

## 2017-02-16 DIAGNOSIS — M19041 Primary osteoarthritis, right hand: Secondary | ICD-10-CM | POA: Diagnosis not present

## 2017-02-16 DIAGNOSIS — M79671 Pain in right foot: Secondary | ICD-10-CM | POA: Diagnosis not present

## 2017-03-10 DIAGNOSIS — M05771 Rheumatoid arthritis with rheumatoid factor of right ankle and foot without organ or systems involvement: Secondary | ICD-10-CM | POA: Diagnosis not present

## 2017-03-10 DIAGNOSIS — M722 Plantar fascial fibromatosis: Secondary | ICD-10-CM | POA: Diagnosis not present

## 2017-03-16 IMAGING — MG MM SCREENING BREAST TOMO UNI L
6 series · 6 of 14 positions shown · non-contrast
Comparison: Previous exam(s).

CLINICAL DATA: Screening.

EXAM:
DIGITAL SCREENING UNILATERAL LEFT MAMMOGRAM WITH TOMO AND CAD

[L MLO synth-2D]
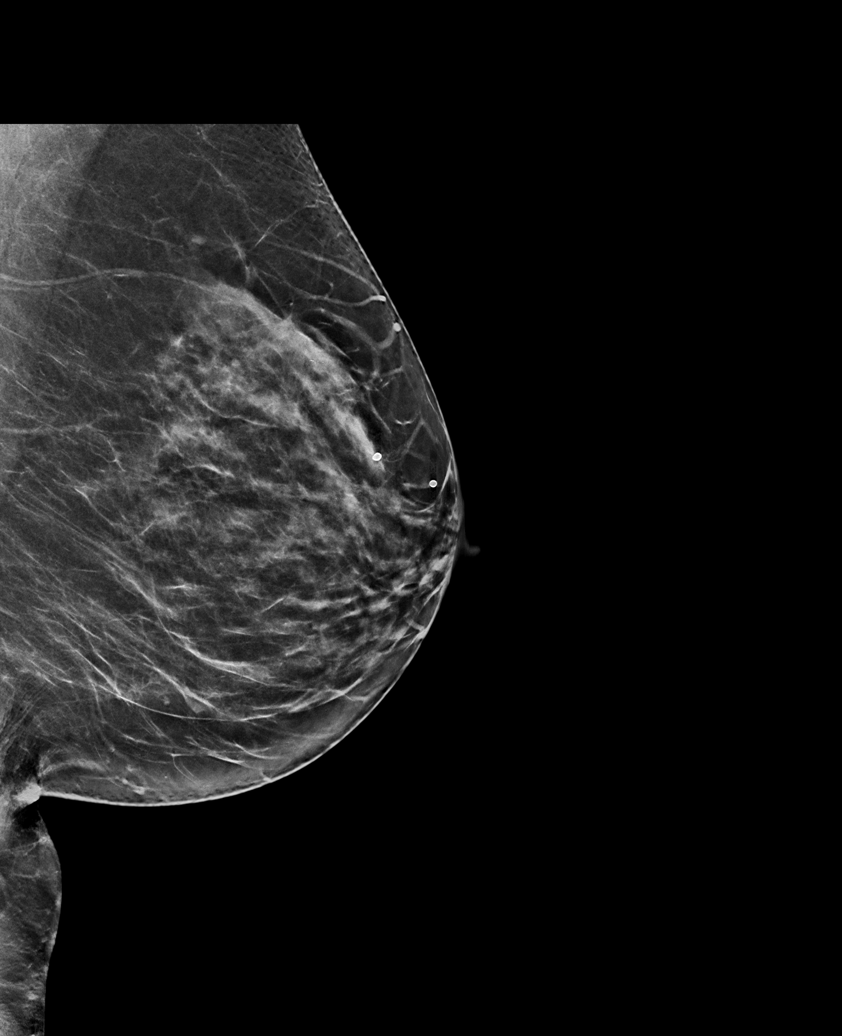

[L MLO]
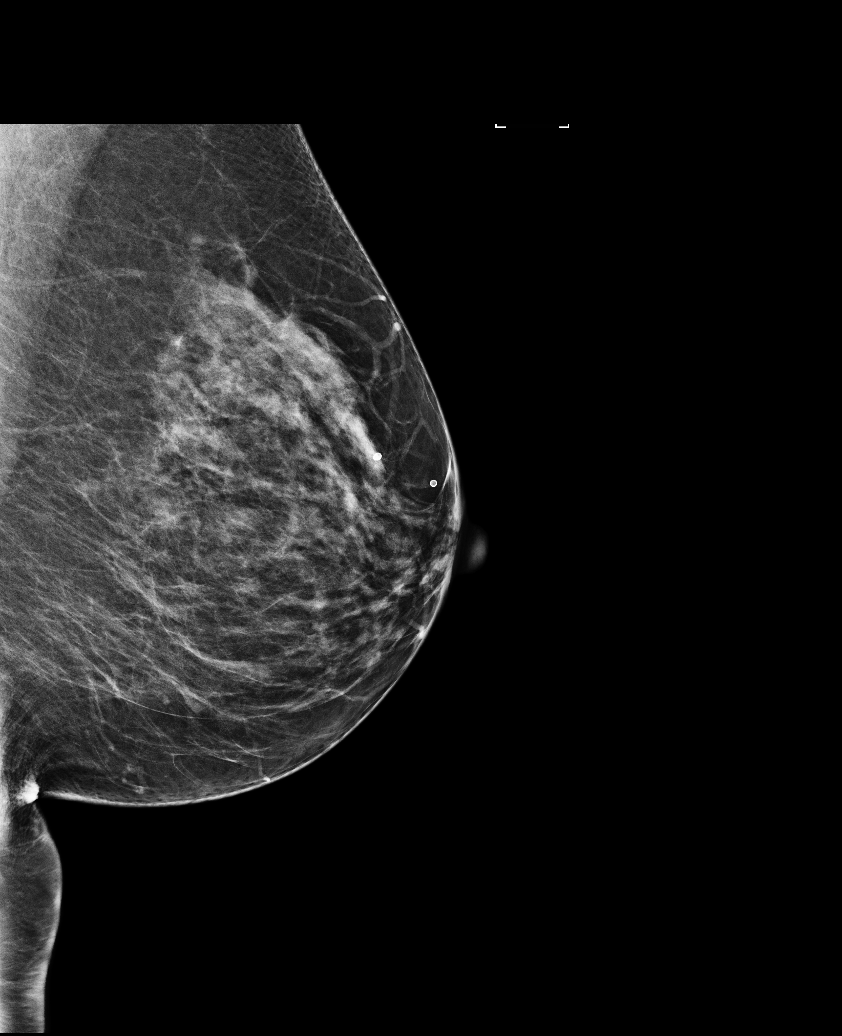

[L CC synth-2D]
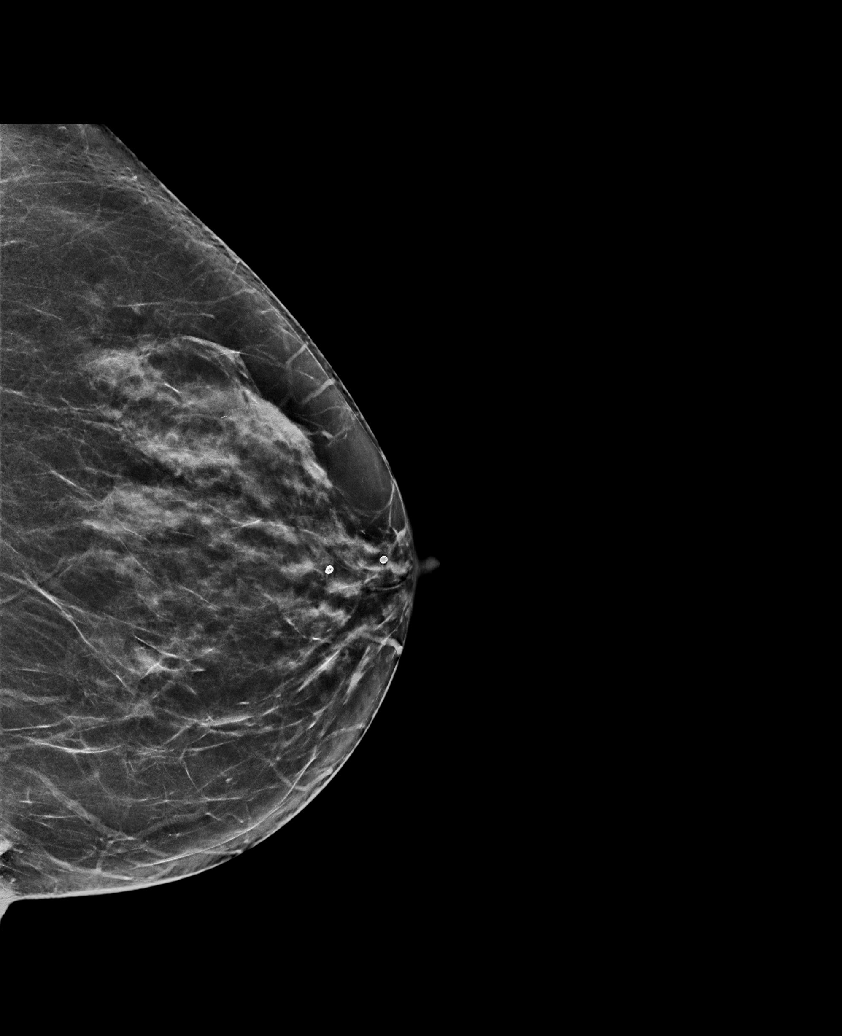

[L CC]
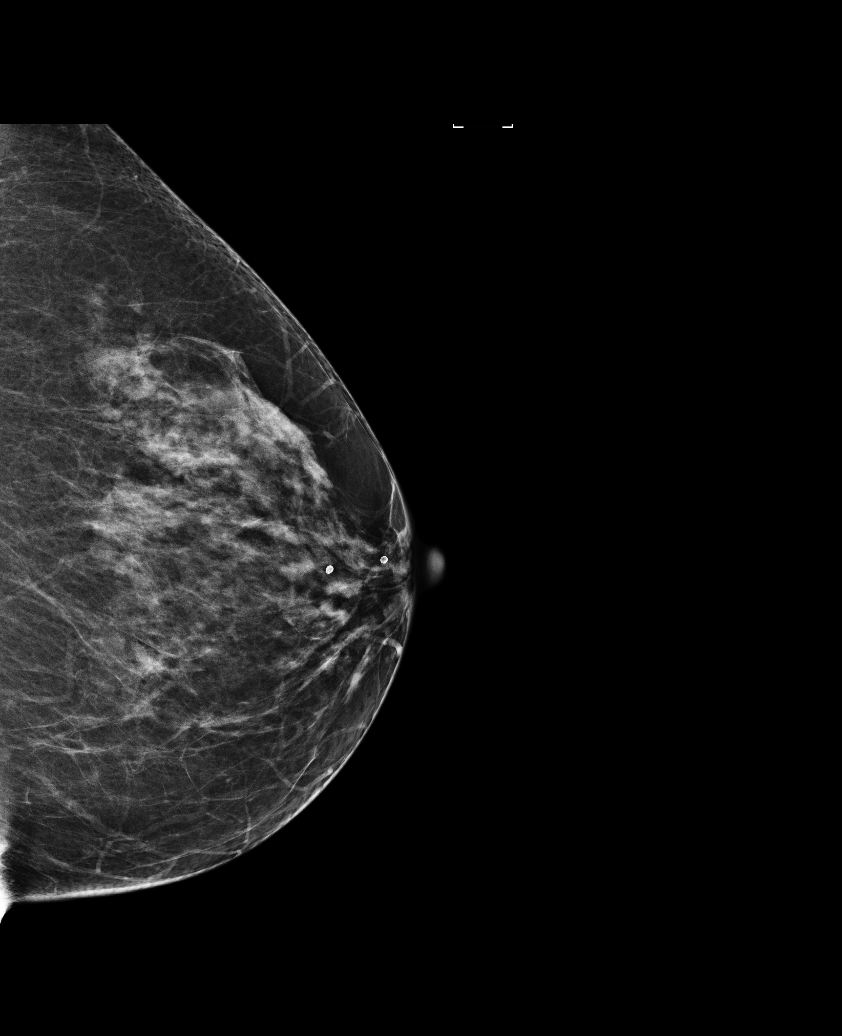

[L MLO tomo · tomo slice 35/70.0]
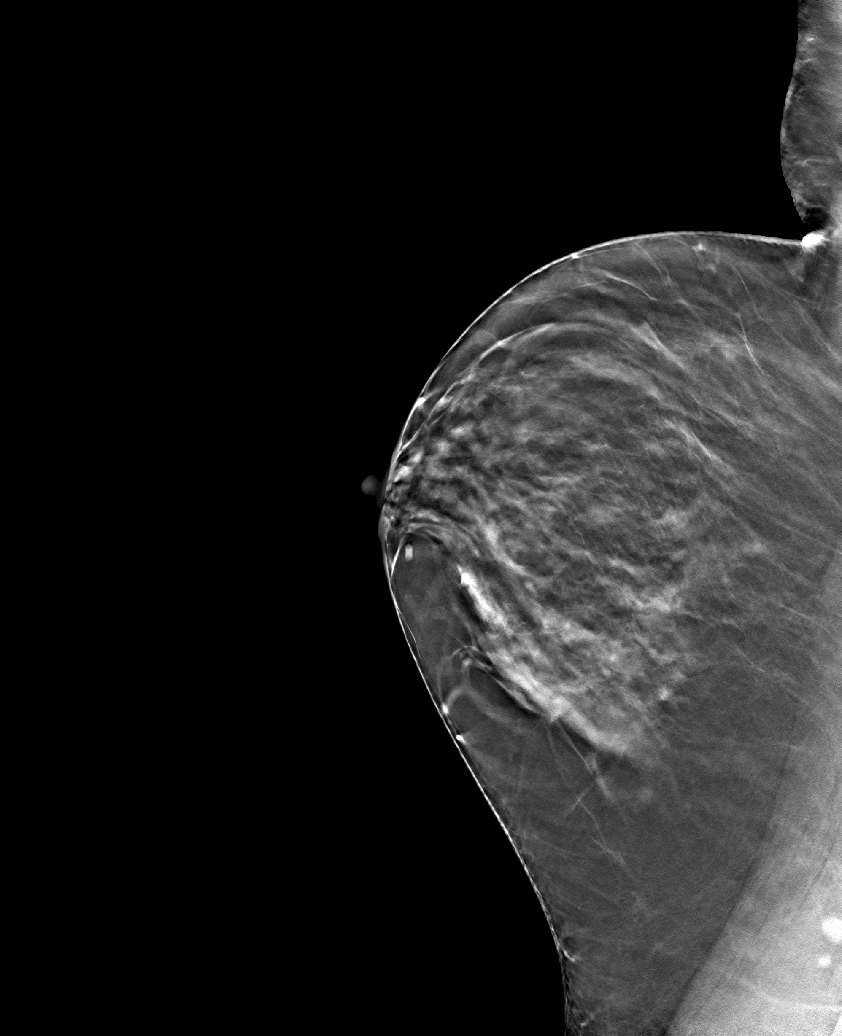

[L CC tomo · tomo slice 35/68.0]
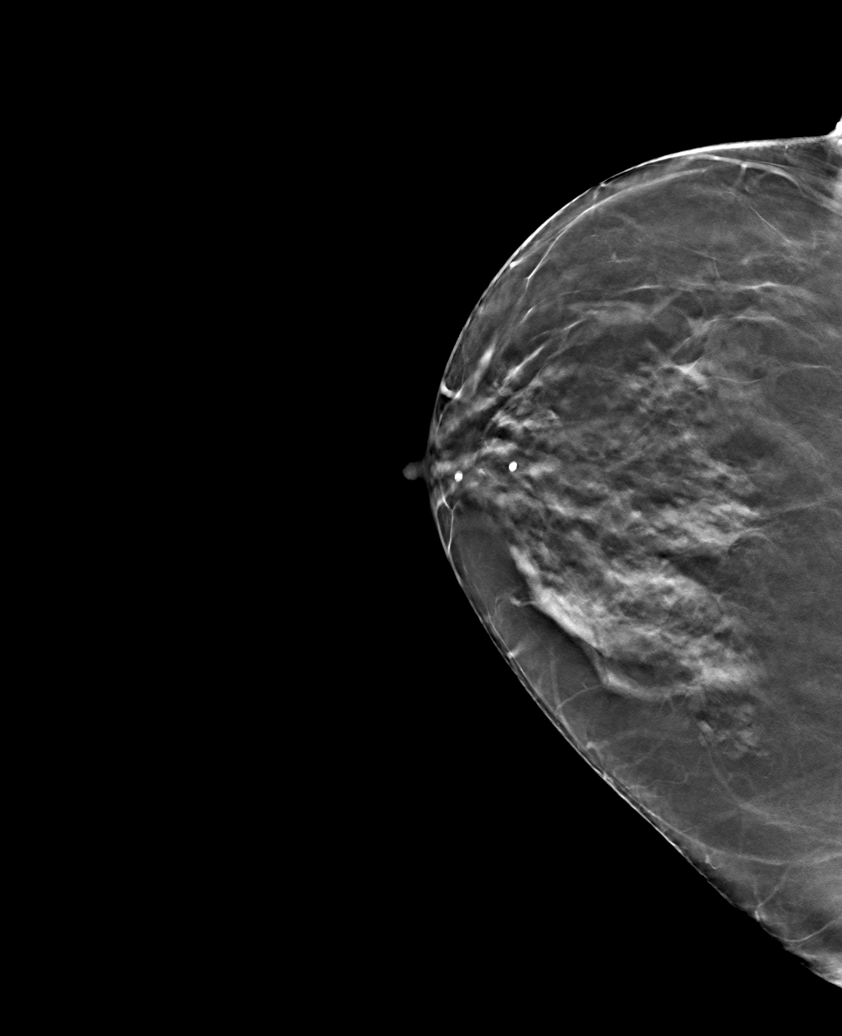

[6 of 14 positions shown; findings below may reference images not displayed]

ACR Breast Density Category c: The breast tissue is heterogeneously
dense, which may obscure small masses.
FINDINGS: There are no findings suspicious for malignancy. Images were
processed with CAD.
IMPRESSION: No mammographic evidence of malignancy. A result letter of this
screening mammogram will be mailed directly to the patient.

RECOMMENDATION:
Screening mammogram in one year. (Code:[id number]-S-PMB)

BI-RADS CATEGORY  1: Negative.

## 2017-03-22 DIAGNOSIS — Z9011 Acquired absence of right breast and nipple: Secondary | ICD-10-CM | POA: Diagnosis not present

## 2017-03-22 DIAGNOSIS — Z853 Personal history of malignant neoplasm of breast: Secondary | ICD-10-CM | POA: Diagnosis not present

## 2017-04-13 DIAGNOSIS — Z853 Personal history of malignant neoplasm of breast: Secondary | ICD-10-CM | POA: Diagnosis not present

## 2017-04-13 DIAGNOSIS — Z9011 Acquired absence of right breast and nipple: Secondary | ICD-10-CM | POA: Diagnosis not present

## 2017-04-25 ENCOUNTER — Other Ambulatory Visit: Payer: Self-pay | Admitting: Internal Medicine

## 2017-04-25 ENCOUNTER — Ambulatory Visit
Admission: RE | Admit: 2017-04-25 | Discharge: 2017-04-25 | Disposition: A | Discharge status: Home / Self Care | Payer: 59 | Source: Ambulatory Visit | Attending: Internal Medicine | Admitting: Internal Medicine

## 2017-04-25 ENCOUNTER — Encounter: Payer: Self-pay | Admitting: Internal Medicine

## 2017-04-25 DIAGNOSIS — Z1239 Encounter for other screening for malignant neoplasm of breast: Secondary | ICD-10-CM

## 2017-04-25 DIAGNOSIS — Z1231 Encounter for screening mammogram for malignant neoplasm of breast: Secondary | ICD-10-CM | POA: Diagnosis not present

## 2017-05-12 DIAGNOSIS — M0579 Rheumatoid arthritis with rheumatoid factor of multiple sites without organ or systems involvement: Secondary | ICD-10-CM | POA: Diagnosis not present

## 2017-05-12 DIAGNOSIS — Z79899 Other long term (current) drug therapy: Secondary | ICD-10-CM | POA: Diagnosis not present

## 2017-05-24 ENCOUNTER — Other Ambulatory Visit: Payer: Self-pay | Admitting: Internal Medicine

## 2017-05-26 ENCOUNTER — Ambulatory Visit: Payer: Self-pay | Admitting: Physician Assistant

## 2017-05-26 ENCOUNTER — Encounter: Payer: Self-pay | Admitting: Physician Assistant

## 2017-05-26 VITALS — BP 100/70 | HR 85 | Temp 98.5°F

## 2017-05-26 DIAGNOSIS — J069 Acute upper respiratory infection, unspecified: Secondary | ICD-10-CM

## 2017-05-26 MED ORDER — FLUTICASONE PROPIONATE 50 MCG/ACT NA SUSP: 2.0000 | Freq: Every day | NASAL | 6 refills | Status: DC

## 2017-05-26 MED ORDER — AZITHROMYCIN 250 MG PO TABS: ORAL_TABLET | ORAL | 0 refills | Status: DC

## 2017-05-26 MED ORDER — PREDNISONE 10 MG PO TABS: 30.0000 mg | ORAL_TABLET | Freq: Every day | ORAL | 0 refills | Status: DC

## 2017-05-26 NOTE — Progress Notes (Signed)
S: C/o runny nose and congestion for 10 days, no fever, chills, cp/sob, v/d; mucus was green this am but clear throughout the day, cough is sporadic, has a lot of facial and nasal pressure, hoarse voice  Using otc meds: sudafed, mucinex, claritin  O: PE: vitals wnl, nad, perrl eomi, normocephalic, tms dull, nasal mucosa red and swollen, throat injected, neck supple no lymph, lungs c t a, cv rrr, neuro intact  A:  Acute uri   P: drink fluids, continue regular meds , use otc meds of choice, return if not improving in 5 days, return earlier if worsening, zpack, flonase, pred 30mg  qd x 3d

## 2017-06-10 ENCOUNTER — Other Ambulatory Visit: Payer: Self-pay | Admitting: Cardiovascular Disease

## 2017-06-30 DIAGNOSIS — L570 Actinic keratosis: Secondary | ICD-10-CM | POA: Diagnosis not present

## 2017-06-30 DIAGNOSIS — D225 Melanocytic nevi of trunk: Secondary | ICD-10-CM | POA: Diagnosis not present

## 2017-06-30 DIAGNOSIS — Z85828 Personal history of other malignant neoplasm of skin: Secondary | ICD-10-CM | POA: Diagnosis not present

## 2017-06-30 DIAGNOSIS — X32XXXA Exposure to sunlight, initial encounter: Secondary | ICD-10-CM | POA: Diagnosis not present

## 2017-06-30 DIAGNOSIS — D2272 Melanocytic nevi of left lower limb, including hip: Secondary | ICD-10-CM | POA: Diagnosis not present

## 2017-06-30 DIAGNOSIS — L821 Other seborrheic keratosis: Secondary | ICD-10-CM | POA: Diagnosis not present

## 2017-07-12 DIAGNOSIS — M069 Rheumatoid arthritis, unspecified: Secondary | ICD-10-CM | POA: Diagnosis not present

## 2017-07-12 DIAGNOSIS — Z79899 Other long term (current) drug therapy: Secondary | ICD-10-CM | POA: Diagnosis not present

## 2017-08-01 ENCOUNTER — Ambulatory Visit: Payer: Self-pay | Admitting: Primary Care

## 2017-08-01 DIAGNOSIS — Z79899 Other long term (current) drug therapy: Secondary | ICD-10-CM | POA: Diagnosis not present

## 2017-08-01 DIAGNOSIS — K5730 Diverticulosis of large intestine without perforation or abscess without bleeding: Secondary | ICD-10-CM | POA: Diagnosis not present

## 2017-08-01 DIAGNOSIS — I429 Cardiomyopathy, unspecified: Secondary | ICD-10-CM | POA: Diagnosis not present

## 2017-08-01 DIAGNOSIS — M05771 Rheumatoid arthritis with rheumatoid factor of right ankle and foot without organ or systems involvement: Secondary | ICD-10-CM | POA: Diagnosis not present

## 2017-08-02 ENCOUNTER — Other Ambulatory Visit: Payer: Self-pay | Admitting: Internal Medicine

## 2017-08-02 DIAGNOSIS — R1032 Left lower quadrant pain: Secondary | ICD-10-CM

## 2017-08-02 DIAGNOSIS — R7989 Other specified abnormal findings of blood chemistry: Secondary | ICD-10-CM

## 2017-08-02 DIAGNOSIS — R945 Abnormal results of liver function studies: Secondary | ICD-10-CM

## 2017-08-04 ENCOUNTER — Ambulatory Visit
Admission: RE | Admit: 2017-08-04 | Discharge: 2017-08-04 | Disposition: A | Discharge status: Home / Self Care | Payer: 59 | Source: Ambulatory Visit | Attending: Internal Medicine | Admitting: Internal Medicine

## 2017-08-04 DIAGNOSIS — R1032 Left lower quadrant pain: Secondary | ICD-10-CM | POA: Insufficient documentation

## 2017-08-04 DIAGNOSIS — K7689 Other specified diseases of liver: Secondary | ICD-10-CM | POA: Insufficient documentation

## 2017-08-04 DIAGNOSIS — R109 Unspecified abdominal pain: Secondary | ICD-10-CM | POA: Diagnosis not present

## 2017-08-04 DIAGNOSIS — R7989 Other specified abnormal findings of blood chemistry: Secondary | ICD-10-CM | POA: Insufficient documentation

## 2017-08-04 DIAGNOSIS — R945 Abnormal results of liver function studies: Secondary | ICD-10-CM

## 2017-08-04 DIAGNOSIS — K5730 Diverticulosis of large intestine without perforation or abscess without bleeding: Secondary | ICD-10-CM | POA: Diagnosis not present

## 2017-08-04 LAB — POCT I-STAT CREATININE: Creatinine, Ser: 0.7 mg/dL (ref 0.44–1.00)

## 2017-08-04 MED ORDER — IOPAMIDOL (ISOVUE-300) INJECTION 61%
100.0000 mL | Freq: Once | INTRAVENOUS | Status: AC | PRN
  Administered 2017-08-04: 100 mL via INTRAVENOUS

## 2017-08-05 ENCOUNTER — Other Ambulatory Visit: Payer: Self-pay | Admitting: Internal Medicine

## 2017-08-05 DIAGNOSIS — R1084 Generalized abdominal pain: Secondary | ICD-10-CM

## 2017-08-09 ENCOUNTER — Ambulatory Visit: Payer: 59

## 2017-08-12 ENCOUNTER — Ambulatory Visit
Admission: RE | Admit: 2017-08-12 | Discharge: 2017-08-12 | Disposition: A | Discharge status: Home / Self Care | Payer: 59 | Source: Ambulatory Visit | Attending: Internal Medicine | Admitting: Internal Medicine

## 2017-08-12 DIAGNOSIS — K56609 Unspecified intestinal obstruction, unspecified as to partial versus complete obstruction: Secondary | ICD-10-CM | POA: Diagnosis not present

## 2017-08-12 DIAGNOSIS — R1084 Generalized abdominal pain: Secondary | ICD-10-CM | POA: Insufficient documentation

## 2017-08-16 DIAGNOSIS — C50911 Malignant neoplasm of unspecified site of right female breast: Secondary | ICD-10-CM | POA: Diagnosis not present

## 2017-08-18 DIAGNOSIS — R945 Abnormal results of liver function studies: Secondary | ICD-10-CM | POA: Diagnosis not present

## 2017-08-18 DIAGNOSIS — M0579 Rheumatoid arthritis with rheumatoid factor of multiple sites without organ or systems involvement: Secondary | ICD-10-CM | POA: Diagnosis not present

## 2017-08-18 DIAGNOSIS — Z79899 Other long term (current) drug therapy: Secondary | ICD-10-CM | POA: Diagnosis not present

## 2017-09-05 ENCOUNTER — Other Ambulatory Visit: Payer: Self-pay | Admitting: Internal Medicine

## 2017-09-14 ENCOUNTER — Other Ambulatory Visit: Payer: Self-pay | Admitting: Cardiovascular Disease

## 2017-10-27 DIAGNOSIS — M05771 Rheumatoid arthritis with rheumatoid factor of right ankle and foot without organ or systems involvement: Secondary | ICD-10-CM | POA: Diagnosis not present

## 2017-10-27 DIAGNOSIS — Z79899 Other long term (current) drug therapy: Secondary | ICD-10-CM | POA: Diagnosis not present

## 2017-11-01 ENCOUNTER — Ambulatory Visit (INDEPENDENT_AMBULATORY_CARE_PROVIDER_SITE_OTHER): Payer: Self-pay | Admitting: Family Medicine

## 2017-11-01 VITALS — BP 103/50 | HR 83 | Temp 97.9°F | Wt 142.0 lb

## 2017-11-01 DIAGNOSIS — R0981 Nasal congestion: Secondary | ICD-10-CM

## 2017-11-01 MED ORDER — PSEUDOEPHEDRINE HCL 60 MG PO TABS
60.0000 mg | ORAL_TABLET | Freq: Three times a day (TID) | ORAL | 0 refills | Status: DC | PRN
Start: 2017-11-01 — End: 2018-03-17

## 2017-11-01 MED ORDER — AZELASTINE HCL 0.1 % NA SOLN: 1.0000 | Freq: Two times a day (BID) | NASAL | 0 refills | Status: DC

## 2017-11-01 NOTE — Progress Notes (Signed)
C/O Sinus/nasal congestion x 45mo

## 2017-11-01 NOTE — Patient Instructions (Addendum)
Plan: If symptoms persist an additional 4 days will prescribe antibiotic for symptoms and treatment. Nurse will give f/u call to assess symptoms.   Sinusitis, Adult Sinusitis is soreness and inflammation of your sinuses. Sinuses are hollow spaces in the bones around your face. They are located:  Around your eyes.  In the middle of your forehead.  Behind your nose.  In your cheekbones.  Your sinuses and nasal passages are lined with a stringy fluid (mucus). Mucus normally drains out of your sinuses. When your nasal tissues get inflamed or swollen, the mucus can get trapped or blocked so air cannot flow through your sinuses. This lets bacteria, viruses, and funguses grow, and that leads to infection. Follow these instructions at home: Medicines  Take, use, or apply over-the-counter and prescription medicines only as told by your doctor. These may include nasal sprays.  If you were prescribed an antibiotic medicine, take it as told by your doctor. Do not stop taking the antibiotic even if you start to feel better. Hydrate and Humidify  Drink enough water to keep your pee (urine) clear or pale yellow.  Use a cool mist humidifier to keep the humidity level in your home above 50%.  Breathe in steam for 10-15 minutes, 3-4 times a day or as told by your doctor. You can do this in the bathroom while a hot shower is running.  Try not to spend time in cool or dry air. Rest  Rest as much as possible.  Sleep with your head raised (elevated).  Make sure to get enough sleep each night. General instructions  Put a warm, moist washcloth on your face 3-4 times a day or as told by your doctor. This will help with discomfort.  Wash your hands often with soap and water. If there is no soap and water, use hand sanitizer.  Do not smoke. Avoid being around people who are smoking (secondhand smoke).  Keep all follow-up visits as told by your doctor. This is important. Contact a doctor  if:  You have a fever.  Your symptoms get worse.  Your symptoms do not get better within 10 days. Get help right away if:  You have a very bad headache.  You cannot stop throwing up (vomiting).  You have pain or swelling around your face or eyes.  You have trouble seeing.  You feel confused.  Your neck is stiff.  You have trouble breathing. This information is not intended to replace advice given to you by your health care provider. Make sure you discuss any questions you have with your health care provider. Document Released: 02/02/2008 Document Revised: 04/11/2016 Document Reviewed: 06/11/2015 Elsevier Interactive Patient Education  [employer].

## 2017-11-01 NOTE — Progress Notes (Signed)
Helen Parks is a 67 y.o. female who is here for concerns of nasal congestions and facial stuffiness that has waxed and waned over the last 30 days. Ezme does feel that she has completely improved after the episodes previously and was recently exposed to a sick grandchild and now her symptoms have resolved. She denies cough, fever or body aches during interview with primary symptoms being nasal congestion and facial fullness and pressure in the last 48 hours.   Review of Systems  Constitutional: Negative for chills, fever and malaise/fatigue.  HENT: Positive for sinus pain. Negative for congestion, ear discharge, ear pain, hearing loss, sore throat and tinnitus.   Eyes: Negative.   Respiratory: Negative for cough and hemoptysis.   Cardiovascular: Negative for chest pain and palpitations.  Gastrointestinal: Negative.   Genitourinary: Negative for dysuria, frequency and urgency.  Musculoskeletal: Negative for back pain, myalgias and neck pain.  Skin: Negative.   Neurological: Negative.     Physical Exam  Constitutional: She is oriented to person, place, and time. She appears well-developed and well-nourished.  HENT:  Head: Normocephalic.  Right Ear: Hearing, tympanic membrane, external ear and ear canal normal.  Left Ear: Hearing, tympanic membrane, external ear and ear canal normal.  Nose: Rhinorrhea present.  Mouth/Throat: Uvula is midline.  Nasal erythema external and internal  Cardiovascular: Normal rate and regular rhythm.  Pulmonary/Chest: Effort normal and breath sounds normal.  Abdominal: Soft. Bowel sounds are normal.  Lymphadenopathy:    She has cervical adenopathy.  Neurological: She is alert and oriented to person, place, and time.  Skin: Skin is warm and dry.  Nursing note and vitals reviewed.  Vitals:   11/01/17 1246  BP: (!) 103/50  Pulse: 83  Temp: 97.9 F (36.6 C)  SpO2: 95%    A: 1. Nasal sinus congestion     Plan: If symptoms persist an additional 4  days will prescribe antibiotic for symptoms and treatment. Considerations include doxycycline 100 mg BID for 10 days or azithromycin- discussed the plan and future treatment options with patient during visit.Nurse will give f/u call to assess symptoms. Discussed patient blood pressure results which were on the lower end and she reports this is her normal range.  1. Nasal sinus congestion - azelastine (ASTELIN) 0.1 % nasal spray; Place 1 spray into both nostrils 2 (two) times daily for 14 days. Use in each nostril as directed - pseudoephedrine (SUDAFED) 60 MG tablet; Take 1 tablet (60 mg total) by mouth every 8 (eight) hours as needed for congestion.

## 2017-11-03 ENCOUNTER — Telehealth: Payer: Self-pay | Admitting: Emergency Medicine

## 2017-11-03 DIAGNOSIS — Z79899 Other long term (current) drug therapy: Secondary | ICD-10-CM | POA: Diagnosis not present

## 2017-11-03 DIAGNOSIS — M25531 Pain in right wrist: Secondary | ICD-10-CM | POA: Diagnosis not present

## 2017-11-03 DIAGNOSIS — M05771 Rheumatoid arthritis with rheumatoid factor of right ankle and foot without organ or systems involvement: Secondary | ICD-10-CM | POA: Diagnosis not present

## 2017-11-03 DIAGNOSIS — M79671 Pain in right foot: Secondary | ICD-10-CM | POA: Diagnosis not present

## 2017-11-03 NOTE — Telephone Encounter (Signed)
Left message follow up call on visit with [facility]. 

## 2017-11-06 ENCOUNTER — Ambulatory Visit
Admission: EM | Admit: 2017-11-06 | Discharge: 2017-11-06 | Disposition: A | Discharge status: Home / Self Care | Payer: 59 | Attending: Emergency Medicine | Admitting: Emergency Medicine

## 2017-11-06 ENCOUNTER — Ambulatory Visit (INDEPENDENT_AMBULATORY_CARE_PROVIDER_SITE_OTHER): Payer: 59

## 2017-11-06 ENCOUNTER — Other Ambulatory Visit: Payer: Self-pay

## 2017-11-06 DIAGNOSIS — J111 Influenza due to unidentified influenza virus with other respiratory manifestations: Secondary | ICD-10-CM

## 2017-11-06 DIAGNOSIS — R51 Headache: Secondary | ICD-10-CM | POA: Diagnosis not present

## 2017-11-06 DIAGNOSIS — R0602 Shortness of breath: Secondary | ICD-10-CM

## 2017-11-06 DIAGNOSIS — R69 Illness, unspecified: Secondary | ICD-10-CM | POA: Diagnosis not present

## 2017-11-06 DIAGNOSIS — R05 Cough: Secondary | ICD-10-CM

## 2017-11-06 DIAGNOSIS — M791 Myalgia, unspecified site: Secondary | ICD-10-CM

## 2017-11-06 DIAGNOSIS — R509 Fever, unspecified: Secondary | ICD-10-CM

## 2017-11-06 LAB — RAPID INFLUENZA A&B ANTIGENS
Influenza A (ARMC): NEGATIVE
Influenza B (ARMC): NEGATIVE

## 2017-11-06 MED ORDER — BENZONATATE 200 MG PO CAPS: ORAL_CAPSULE | ORAL | 0 refills | Status: DC

## 2017-11-06 MED ORDER — ACETAMINOPHEN 500 MG PO TABS
1000.0000 mg | ORAL_TABLET | Freq: Once | ORAL | Status: AC
  Administered 2017-11-06: 1000 mg via ORAL

## 2017-11-06 MED ORDER — OSELTAMIVIR PHOSPHATE 75 MG PO CAPS: 75.0000 mg | ORAL_CAPSULE | Freq: Two times a day (BID) | ORAL | 0 refills | Status: DC

## 2017-11-06 MED ORDER — ALBUTEROL SULFATE HFA 108 (90 BASE) MCG/ACT IN AERS: 1.0000 | INHALATION_SPRAY | Freq: Four times a day (QID) | RESPIRATORY_TRACT | 0 refills | Status: DC | PRN

## 2017-11-06 NOTE — ED Triage Notes (Addendum)
Pt with SOB, fever, cough, headache, bodyaches. Pain 8/10. Pt currently taking Prednisone and Z-pack prescribed by her Dr.

## 2017-11-06 NOTE — ED Provider Notes (Signed)
[facility]    CSN: [id number] Arrival date & time: 11/06/17  1234     History   Chief Complaint Chief Complaint  Patient presents with  . Cough  . Shortness of Breath    HPI Helen Parks is a 67 y.o. female.   HPI  67 year old female presents with shortness of breath fever cough headache and body aches.  Any activity causes her to be breathless.  She cared for her to grand daughters diagnosed with influenza.  Not having symptoms after caring for them.  Rheumatologist had prescribed Z-Pak and prednisone but she does not want to take the prednisone.  After today is 101.  Pulse rate of 118 O2 sats on room air 98%.      Past Medical History:  Diagnosis Date  . Breast cancer ([place]) 1997   s/p mastectomy, right breast, chemo, BRCA negative  . Cardiomyopathy   . 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 Chronic systolic congestive heart failure ([place])   . Collagen vascular disease ([facility])    RA  . Diverticulosis of colon (without mention of hemorrhage) April 2012   occurred after colonoscopy, [place]  . Menopause    secondary to chemo for BRCA  . Rheumatoid arthritis ([place])   . Rheumatoid arthritis(714.0)    on Plaquenil methotrxate [place] [place])    Patient Active Problem List   Diagnosis Date Noted  . Diverticulitis of large intestine without perforation or abscess without bleeding 05/19/2016  . Left sided abdominal pain 12/29/2015  . Fatigue 09/05/2015  . Visit for preventive health examination 02/27/2015  . Hiatal hernia 02/27/2015  . Unspecified vitamin D deficiency 02/23/2014  . Chronic systolic congestive heart failure ([place])   . Screening for cervical cancer 09/15/2011  . Screening for colon cancer 09/15/2011  . Diverticulosis of colon (without mention of hemorrhage)   . Menopause   . Rheumatoid arthritis(714.0)   . History of right breast cancer   . EDEMA 07/15/2010  . CARDIOMYOPATHY, PRIMARY, DILATED  11/14/2009    Past Surgical History:  Procedure Laterality Date  . ANKLE FUSION     right, seondary to erosive arthritis  . BREAST BIOPSY Right 1997   positive for breast ca  . BREAST SURGERY Right 1997   Lumpectomy  . CARDIAC CATHETERIZATION  2010   normal coronary arteries  . COLONOSCOPY WITH PROPOFOL N/A 03/26/2016   Procedure: COLONOSCOPY WITH PROPOFOL;  Surgeon: Lollie Sails, MD;  Location: [facility] END[facility]COPY;  Service: Endoscopy;  Laterality: N/A;  . FOOT SURGERY  2004  . MASTECTOMY Right 1997  . rheumatology histroy      OB History    Gravida Para Term Preterm AB Living   '3 2     1     '$ SAB TAB Ectopic Multiple Live Births                  Obstetric Comments   1st Menstrual Cycle:  15 1st Pregnancy:  31        Home Medications    Prior to Admission medications   Medication Sig Start Date End Date Taking? Authorizing Provider  albuterol (PROVENTIL HFA;VENTOLIN HFA) 108 (90 Base) MCG/ACT inhaler Inhale 1-2 puffs into the lungs every 6 (six) hours as needed for wheezing or shortness of breath. Use with spacer 11/06/17   Lorin Picket, PA-C  azelastine (ASTELIN) 0.1 % nasal spray Place 1 spray into both nostrils 2 (two) times  daily for 14 days. Use in each nostril as directed 11/01/17 11/15/17  Shella Maxim, NP  azithromycin (ZITHROMAX Z-PAK) 250 MG tablet 2 pills today then 1 pill a day for 4 days Patient not taking: Reported on 11/01/2017 05/26/17   Versie Starks, PA-C  benzonatate (TESSALON) 200 MG capsule Take one cap TID PRN cough 11/06/17   Lorin Picket, PA-C  Calcium 150 MG TABS Take by mouth daily.      [provider]  Calcium Citrate (CITRACAL PO) Take by mouth.    [provider]  carvedilol (COREG) 6.25 MG tablet TAKE ONE TABLET BY MOUTH 2 TIMES A DAY 09/14/17   Wellington Hampshire, MD  cholecalciferol (VITAMIN D) 1000 UNITS tablet Take 2,000 Units by mouth daily.     [provider]  folic acid (FOLVITE) 1 MG tablet Take 1 mg  by mouth daily.      [provider]  furosemide (LASIX) 20 MG tablet TAKE ONE TABLET BY MOUTH 2 TIMES A DAY 09/06/17   Crecencio Mc, MD  hydroxychloroquine (PLAQUENIL) 200 MG tablet Take 200 mg by mouth daily.    [provider]  lisinopril (PRINIVIL,ZESTRIL) 10 MG tablet TAKE ONE TABLET BY MOUTH DAILY 06/10/17   Wellington Hampshire, MD  methotrexate 50 MG/2ML injection INJECT 1ML SUBCUTANEOUSLY ONCE A WEEK 10/10/15   [provider]  mupirocin ointment (BACTROBAN) 2 % Apply bid 08/19/16   Caryn Section Linden Dolin, PA-C  oseltamivir (TAMIFLU) 75 MG capsule Take 1 capsule (75 mg total) by mouth every 12 (twelve) hours. 11/06/17   Lorin Picket, PA-C  predniSONE (DELTASONE) 10 MG tablet Take 3 tablets (30 mg total) by mouth daily with breakfast. Patient not taking: Reported on 11/01/2017 05/26/17   Versie Starks, PA-C  pseudoephedrine (SUDAFED) 60 MG tablet Take 1 tablet (60 mg total) by mouth every 8 (eight) hours as needed for congestion. 11/01/17   Shella Maxim, NP  spironolactone (ALDACTONE) 25 MG tablet TAKE 1 TABLET BY MOUTH DAILY 02/07/17   Wellington Hampshire, MD  tamsulosin (FLOMAX) 0.4 MG CAPS capsule TAKE ONE CAPSULE BY MOUTH DAILY 12/21/16   Crecencio Mc, MD    Family History Family History  Problem Relation Age of Onset  . Heart disease Father   . Cancer Mother        leukemia  . Cancer Paternal Aunt        ovarian  . Heart disease Maternal Grandmother     Social History Social History   Tobacco Use  . Smoking status: Never Smoker  . Smokeless tobacco: Never Used  . Tobacco comment: tobacco use- no  Substance Use Topics  . Alcohol use: Yes    Comment: social  . Drug use: No     Allergies   Amoxicillin; Sulfasalazine; and Latex   Review of Systems Review of Systems  Constitutional: Positive for activity change, chills, fatigue and fever. Negative for appetite change.  HENT: Positive for congestion.   Respiratory: Positive for cough, shortness  of breath and wheezing.   All other systems reviewed and are negative.    Physical Exam Triage Vital Signs ED Triage Vitals  Enc Vitals Group     BP 11/06/17 1301 125/73     Pulse Rate 11/06/17 1301 (!) 118     Resp 11/06/17 1301 20     Temp 11/06/17 1301 (!) 101 F (38.3 C)     Temp Source 11/06/17 1301 Oral     SpO2  11/06/17 1301 98 %     Weight 11/06/17 1303 142 lb (64.4 kg)     Height 11/06/17 1303 '5\' 4"'$  (1.626 m)     Head Circumference --      Peak Flow --      Pain Score 11/06/17 1303 8     Pain Loc --      Pain Edu? --      Excl. in [place]? --    No data found.  Updated Vital Signs BP 125/73 (BP Location: Left Arm)   Pulse (!) 118   Temp (!) 101 F (38.3 C) (Oral)   Resp 20   Ht '5\' 4"'$  (1.626 m)   Wt 142 lb (64.4 kg)   SpO2 98%   BMI 24.37 kg/m   Visual Acuity Right Eye Distance:   Left Eye Distance:   Bilateral Distance:    Right Eye Near:   Left Eye Near:    Bilateral Near:     Physical Exam  Constitutional: She is oriented to person, place, and time. She appears well-developed and well-nourished.  Non-toxic appearance. She does not appear ill. No distress.  HENT:  Head: Normocephalic.  Mouth/Throat: Oropharynx is clear and moist.  Eyes: Pupils are equal, round, and reactive to light.  Neck: Normal range of motion.  Pulmonary/Chest: Effort normal. She has wheezes in the right middle field, the left upper field and the left middle field.  Musculoskeletal:       Right lower leg: She exhibits no edema.       Left lower leg: She exhibits no edema.  Neurological: She is alert and oriented to person, place, and time.  Skin: Skin is warm and dry.  Psychiatric: She has a normal mood and affect. Her behavior is normal.  Nursing note and vitals reviewed.    UC Treatments / Results  Labs (all labs ordered are listed, but only abnormal results are displayed) Labs Reviewed  RAPID INFLUENZA A&B ANTIGENS ([place])    EKG  EKG Interpretation None         Radiology Dg Chest 2 View  Result Date: 11/06/2017 CLINICAL DATA:  Pt c/o sinus trouble since last Tuesday. No cough, fever and congestion since last Thursday. Hx of right br cancer 1997. NKI. EXAM: CHEST - 2 VIEW COMPARISON:  Chest x-ray dated 01/19/2013 FINDINGS: The heart size and mediastinal contours are within normal limits. Both lungs are clear. The visualized skeletal structures are unremarkable. Surgical clips along the right lateral chest wall. IMPRESSION: No active cardiopulmonary disease. No evidence of pneumonia or pulmonary edema. Electronically Signed   By: Franki Cabot M.D.   On: 11/06/2017 16:10    Procedures Procedures (including critical care time)  Medications Ordered in UC Medications  acetaminophen (TYLENOL) tablet 1,000 mg (1,000 mg Oral Given 11/06/17 1308)     Initial Impression / Assessment and Plan / UC Course  I have reviewed the triage vital signs and the nursing notes.  Pertinent labs & imaging results that were available during my care of the patient were reviewed by me and considered in my medical decision making (see chart for details).     Plan: 1. Test/x-ray results and diagnosis reviewed with patient 2. rx as per orders; risks, benefits, potential side effects reviewed with patient 3. Recommend supportive treatment with rest and fluids.  Albuterol inhaler for shortness of breath.  Perles for the coughing.  If you worsen go to the emergency room 4. F/u prn if symptoms worsen or don't  improve   Final Clinical Impressions(s) / UC Diagnoses   Final diagnoses:  Influenza-like illness    ED Discharge Orders        Ordered    oseltamivir (TAMIFLU) 75 MG capsule  Every 12 hours     11/06/17 1630    benzonatate (TESSALON) 200 MG capsule     11/06/17 1630    albuterol (PROVENTIL HFA;VENTOLIN HFA) 108 (90 Base) MCG/ACT inhaler  Every 6 hours PRN    Comments:  Provide spacer and instructions to patient   11/06/17 1630       Controlled  Substance Prescriptions [place] Controlled Substance Registry consulted? Not Applicable   Lorin Picket, PA-C 11/06/17 [id number]

## 2017-11-08 DIAGNOSIS — J019 Acute sinusitis, unspecified: Secondary | ICD-10-CM | POA: Diagnosis not present

## 2017-12-05 ENCOUNTER — Other Ambulatory Visit: Payer: Self-pay | Admitting: Cardiovascular Disease

## 2017-12-06 ENCOUNTER — Telehealth: Payer: Self-pay | Admitting: Cardiovascular Disease

## 2017-12-06 NOTE — Telephone Encounter (Signed)
New Message   *STAT* If patient is at the pharmacy, call can be transferred to refill team.   1. Which medications need to be refilled? (please list name of each medication and dose if known)  carvedilol (COREG) 6.25 MG tablet twice daily  2. Which pharmacy/location (including street and city if local pharmacy) is medication to be sent to? [place]  3. Do they need a 30 day or 90 day supply?  90 days supply  Pt scheduled for 6.7.19 and will be out of medication starting tomorrow. Please send rx

## 2017-12-06 NOTE — Telephone Encounter (Signed)
-----   Message from Janan Ridge, [place] sent at 12/05/2017  1:36 PM EDT ----- Can you please try to schedule an appointment with Dr. Rockey Situ. Patient was last seen 10/2016 and was instructed to follow up in 12 months.  Thank You!

## 2017-12-06 NOTE — Telephone Encounter (Signed)
Lmov for patient to call back and schedule  °

## 2017-12-07 NOTE — Telephone Encounter (Signed)
Called to let pt know rx is ready for pick-up

## 2017-12-09 NOTE — Telephone Encounter (Signed)
Scheduled

## 2018-02-03 ENCOUNTER — Ambulatory Visit: Payer: Self-pay | Admitting: Cardiovascular Disease

## 2018-02-08 DIAGNOSIS — M05771 Rheumatoid arthritis with rheumatoid factor of right ankle and foot without organ or systems involvement: Secondary | ICD-10-CM | POA: Diagnosis not present

## 2018-02-08 DIAGNOSIS — Z79899 Other long term (current) drug therapy: Secondary | ICD-10-CM | POA: Diagnosis not present

## 2018-02-10 ENCOUNTER — Other Ambulatory Visit: Payer: Self-pay | Admitting: Internal Medicine

## 2018-02-10 ENCOUNTER — Other Ambulatory Visit: Payer: Self-pay | Admitting: Cardiovascular Disease

## 2018-02-10 NOTE — Telephone Encounter (Deleted)
Refilled: 4/24/20183 Last OV: 11/02/2016 Next OV: not scheduled

## 2018-03-09 ENCOUNTER — Other Ambulatory Visit: Payer: Self-pay | Admitting: Podiatry

## 2018-03-09 ENCOUNTER — Ambulatory Visit (INDEPENDENT_AMBULATORY_CARE_PROVIDER_SITE_OTHER): Payer: Medicare Other | Admitting: Podiatry

## 2018-03-09 ENCOUNTER — Encounter: Payer: Self-pay | Admitting: Podiatry

## 2018-03-09 ENCOUNTER — Ambulatory Visit (INDEPENDENT_AMBULATORY_CARE_PROVIDER_SITE_OTHER): Payer: Medicare Other

## 2018-03-09 VITALS — BP 106/60 | HR 81 | Resp 16

## 2018-03-09 DIAGNOSIS — M79671 Pain in right foot: Secondary | ICD-10-CM

## 2018-03-09 DIAGNOSIS — M21619 Bunion of unspecified foot: Secondary | ICD-10-CM | POA: Diagnosis not present

## 2018-03-09 DIAGNOSIS — M779 Enthesopathy, unspecified: Secondary | ICD-10-CM

## 2018-03-09 MED ORDER — TRIAMCINOLONE ACETONIDE 10 MG/ML IJ SUSP
10.0000 mg | Freq: Once | INTRAMUSCULAR | Status: AC
Start: 2018-03-09 — End: 2018-03-09
  Administered 2018-03-09: 10 mg

## 2018-03-09 NOTE — Progress Notes (Signed)
   Subjective:    Patient ID: Helen Parks, female    DOB: 1951/03/16, 67 y.o.   MRN: [id number]  HPI    Review of Systems  All other systems reviewed and are negative.      Objective:   Physical Exam        Assessment & Plan:

## 2018-03-09 NOTE — Patient Instructions (Signed)

## 2018-03-13 NOTE — Progress Notes (Signed)
Subjective:   Patient ID: Helen Parks, female   DOB: 67 y.o.   MRN: [id number]   HPI Patient points to the right foot and states she is been getting pain around the big toe joint and also the midfoot.  She does not remember any specific injury and does have rheumatoid arthritis which she has had for a number of years.  States is been hurting her for several months and is worse around the bunion site which we have done surgery on approximate 25 years ago.  Patient does not smoke and likes to be active   Review of Systems  All other systems reviewed and are negative.       Objective:  Physical Exam  Constitutional: She appears well-developed and well-nourished.  Cardiovascular: Intact distal pulses.  Pulmonary/Chest: Effort normal.  Musculoskeletal: Normal range of motion.  Neurological: She is alert.  Skin: Skin is warm.  Nursing note and vitals reviewed.   Neurovascular status found to be intact muscle strength was adequate range of motion within normal limits with patient noted to have discomfort around the first MPJ right with inflammation fluid around the bunion site and into the distal fascia.  I also noted discomfort around the navicular posterior tibial insertion with moderate depression of the arch with no indication of muscle strength loss of the posterior tib patient is found to have good digital perfusion and is well oriented x3     Assessment:  Tendinitis of the posterior tibial tendon insertion right with inflammatory capsulitis of the first MPJ right with fluid buildup around the joint surface     Plan:  H&P education rendered and today I went ahead did a careful injection around the first MPJ 3 mg Kenalog 5 mg Xylocaine and I went ahead and I advised on supportive shoes and I applied fascial brace to lift the arch up.  May need to require and treat the midfoot depending on response  X-rays indicate good structural correction bunion and I did not see signs of advanced  rheumatoid arthritis and or indications of erosions

## 2018-03-16 ENCOUNTER — Ambulatory Visit: Payer: Self-pay | Admitting: Cardiovascular Disease

## 2018-03-17 ENCOUNTER — Ambulatory Visit (INDEPENDENT_AMBULATORY_CARE_PROVIDER_SITE_OTHER): Payer: Medicare Other | Admitting: Cardiovascular Disease

## 2018-03-17 ENCOUNTER — Encounter: Payer: Self-pay | Admitting: Cardiovascular Disease

## 2018-03-17 VITALS — BP 100/60 | HR 80 | Ht 64.0 in | Wt 139.2 lb

## 2018-03-17 DIAGNOSIS — I5022 Chronic systolic (congestive) heart failure: Secondary | ICD-10-CM | POA: Diagnosis not present

## 2018-03-17 DIAGNOSIS — I340 Nonrheumatic mitral (valve) insufficiency: Secondary | ICD-10-CM

## 2018-03-17 NOTE — Progress Notes (Signed)
Cardiology Office Note   Date:  03/17/2018   ID:  Helen Parks, DOB [birth year], MRN [id number]  PCP:  Idelle Crouch, MD  Cardiologist:   Kathlyn Sacramento, MD   Chief Complaint  Patient presents with  . OTHER    12 month f/u no complaints today. Meds reviewed verbally with pt.      History of Present Illness: Helen Parks is a 67 y.o. 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 She had normal coronary arteries by catheterization in 2010, history of rheumatoid arthritis, breast cancer, status post mastectomy and chemotherapy in 1997 with no radiation therapy with ejection fraction noted to be 30% in 2005.  She had an episode of heart failure in 2010 which was likely triggered by the use of Enbrel for rheumatoid arthritis.  Her initial ejection fraction was 25-30% which gradually improved to 40-45% in June 2016. There was moderate mitral regurgitation with no evidence of pulmonary hypertension.  She has been doing well with no recent chest pain, shortness of breath, leg edema or palpitations.  She retired in February and has been feeling better since then.   Past Medical History:  Diagnosis Date  . Breast cancer ([place]) 1997   s/p mastectomy, right breast, chemo, BRCA negative  . Cardiomyopathy   . 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 Chronic systolic congestive heart failure ([facility] [place])   . Collagen vascular disease ([facility])    RA  . Diverticulosis of colon (without mention of hemorrhage) April 2012   occurred after colonoscopy, [place]  . Menopause    secondary to chemo for BRCA  . Rheumatoid arthritis ([place])   . Rheumatoid arthritis(714.0)    on Plaquenil methotrxate Precious Reel)    Past Surgical History:  Procedure Laterality Date  . ANKLE FUSION     right, seondary to erosive arthritis  . BREAST BIOPSY Right 1997   positive for breast  ca  . BREAST SURGERY Right 1997   Lumpectomy  . CARDIAC CATHETERIZATION  2010   normal coronary arteries  . COLONOSCOPY WITH PROPOFOL N/A 03/26/2016   Procedure: COLONOSCOPY WITH PROPOFOL;  Surgeon: Lollie Sails, MD;  Location: [facility] ENDOSCOPY;  Service: Endoscopy;  Laterality: N/A;  . FOOT SURGERY  2004  . MASTECTOMY Right 1997  . rheumatology histroy       Current Outpatient Medications  Medication Sig Dispense Refill  . Calcium 150 MG TABS Take by mouth daily.      . carvedilol (COREG) 6.25 MG tablet TAKE ONE TABLET BY MOUTH 2 TIMES A DAY 180 tablet 3  . cholecalciferol (VITAMIN D) 1000 UNITS tablet Take 2,000 Units by mouth daily.     . folic acid (FOLVITE) 1 MG tablet Take 1 mg by mouth daily.      . furosemide (LASIX) 20 MG tablet TAKE ONE TABLET BY MOUTH 2 TIMES A DAY 180 tablet 0  . hydroxychloroquine (PLAQUENIL) 200 MG tablet Take 200 mg by mouth daily.    Marland Kitchen lisinopril (PRINIVIL,ZESTRIL) 10 MG tablet TAKE ONE TABLET BY MOUTH DAILY 90 tablet 3  . methotrexate 50 MG/2ML injection INJECT 1ML SUBCUTANEOUSLY ONCE A WEEK; takes 25 mg/mL    . mupirocin ointment (BACTROBAN) 2 % Apply bid 22 g 2  . spironolactone (ALDACTONE) 25 MG tablet TAKE 1 TABLET BY MOUTH DAILY 90 tablet 0   No current facility-administered medications for  this visit.     Allergies:   Amoxicillin; Sulfasalazine; and Latex    Social History:  The patient  reports that she has never smoked. She has never used smokeless tobacco. She reports that she drinks alcohol. She reports that she does not use drugs.   Family History:  The patient's family history includes Cancer in her mother and paternal aunt; Heart disease in her father and maternal grandmother.    ROS:  Please see the history of present illness.   Otherwise, review of systems are positive for none.   All other systems are reviewed and negative.    PHYSICAL EXAM: VS:  BP 100/60 (BP Location: Left Arm, Patient Position: Sitting, Cuff Size: Normal)    Pulse 80   Ht '5\' 4"'$  (1.626 m)   Wt 139 lb 4 oz (63.2 kg)   BMI 23.90 kg/m  , BMI Body mass index is 23.9 kg/m. GEN: Well nourished, well developed, in no acute distress  HEENT: normal  Neck: no JVD, carotid bruits, or masses Cardiac: RRR; no murmurs, rubs, or gallops,no edema  Respiratory:  clear to auscultation bilaterally, normal work of breathing GI: soft, nontender, nondistended, + BS MS: no deformity or atrophy  Skin: warm and dry, no rash Neuro:  Strength and sensation are intact Psych: euthymic mood, full affect   EKG:  EKG is ordered today. The ekg ordered today demonstrates normal sinus rhythm with low voltage.   Recent Labs: 08/04/2017: Creatinine, Ser 0.70    Lipid Panel    Component Value Date/Time   CHOL 165 11/02/2016 1520   TRIG 110.0 11/02/2016 1520   TRIG 82 02/04/2010   HDL 55.80 11/02/2016 1520   CHOLHDL 3 11/02/2016 1520   VLDL 22.0 11/02/2016 1520   LDLCALC 87 11/02/2016 1520   LDLDIRECT 87.0 11/02/2016 1520      Wt Readings from Last 3 Encounters:  03/17/18 139 lb 4 oz (63.2 kg)  11/06/17 142 lb (64.4 kg)  11/01/17 142 lb (64.4 kg)        ASSESSMENT AND PLAN:  1.  Chronic systolic heart failure: Due to chemotherapy-induced cardiomyopathy. Most recent ejection fraction was 40-45%. There is no evidence of volume overload on Lasix 20 mg twice daily.  I reviewed her labs done in June which overall were unremarkable.  I am going to repeat echocardiogram to evaluate ejection fraction.  If EF is below 40%, we might need to consider switching lisinopril to [facility] but we might be limited by low blood pressure.  2. Rheumatoid arthritis: This seems to be stable on current medications.  3. Moderate mitral regurgitation: No heart murmurs by physical exam. I requested a follow-up echocardiogram.   Disposition:   FU with me in 1 year  Signed,  Kathlyn Sacramento, MD  03/17/2018 11:31 AM    [place]

## 2018-03-17 NOTE — Patient Instructions (Signed)
Medication Instructions: Your physician recommends that you continue on your current medications as directed. Please refer to the Current Medication list given to you today.  If you need a refill on your cardiac medications before your next appointment, please call your pharmacy.   Procedures/Testing: Your physician has requested that you have an echocardiogram. Echocardiography is a painless test that uses sound waves to create images of your heart. It provides your doctor with information about the size and shape of your heart and how well your heart's chambers and valves are working. You may receive an ultrasound enhancing agent through an IV if needed to better visualize your heart during the echo.This procedure takes approximately one hour. There are no restrictions for this procedure. This will take place at the [facility] clinic.    Follow-Up: Your physician wants you to follow-up in 12 months with Dr. Fletcher Anon. You will receive a reminder letter in the mail two months in advance. If you don't receive a letter, please call our office at [phone] to schedule this follow-up appointment.   Thank you for choosing Heartcare at [facility]!

## 2018-03-29 ENCOUNTER — Other Ambulatory Visit: Payer: Self-pay

## 2018-03-29 ENCOUNTER — Ambulatory Visit (INDEPENDENT_AMBULATORY_CARE_PROVIDER_SITE_OTHER): Payer: Medicare Other

## 2018-03-29 DIAGNOSIS — I5022 Chronic systolic (congestive) heart failure: Secondary | ICD-10-CM

## 2018-03-29 DIAGNOSIS — I340 Nonrheumatic mitral (valve) insufficiency: Secondary | ICD-10-CM | POA: Diagnosis not present

## 2018-04-10 ENCOUNTER — Ambulatory Visit (INDEPENDENT_AMBULATORY_CARE_PROVIDER_SITE_OTHER): Payer: Medicare Other | Admitting: Podiatry

## 2018-04-10 ENCOUNTER — Encounter: Payer: Self-pay | Admitting: Podiatry

## 2018-04-10 DIAGNOSIS — M779 Enthesopathy, unspecified: Secondary | ICD-10-CM

## 2018-05-04 ENCOUNTER — Other Ambulatory Visit: Payer: Self-pay

## 2018-05-04 ENCOUNTER — Other Ambulatory Visit: Payer: Self-pay | Admitting: Internal Medicine

## 2018-05-04 DIAGNOSIS — Z1231 Encounter for screening mammogram for malignant neoplasm of breast: Secondary | ICD-10-CM

## 2018-05-04 MED ORDER — SPIRONOLACTONE 25 MG PO TABS: 25.0000 mg | ORAL_TABLET | Freq: Every day | ORAL | 1 refills | Status: DC

## 2018-05-04 NOTE — Telephone Encounter (Signed)
*  STAT* If patient is at the pharmacy, call can be transferred to refill team.   1. Which medications need to be refilled? (please list name of each medication and dose if known) Spironolactone  2. Which pharmacy/location (including street and city if local pharmacy) is medication to be sent to? [facility]  3. Do they need a 30 day or 90 day supply? [place]

## 2018-05-12 ENCOUNTER — Ambulatory Visit
Admission: RE | Admit: 2018-05-12 | Discharge: 2018-05-12 | Disposition: A | Discharge status: Home / Self Care | Payer: Medicare Other | Source: Ambulatory Visit | Attending: Internal Medicine | Admitting: Internal Medicine

## 2018-05-12 ENCOUNTER — Encounter: Payer: Self-pay | Admitting: Radiology

## 2018-05-12 DIAGNOSIS — Z1231 Encounter for screening mammogram for malignant neoplasm of breast: Secondary | ICD-10-CM | POA: Insufficient documentation

## 2018-06-05 ENCOUNTER — Other Ambulatory Visit: Payer: Self-pay

## 2018-06-05 MED ORDER — CARVEDILOL 6.25 MG PO TABS: ORAL_TABLET | ORAL | 3 refills | Status: DC

## 2018-06-05 MED ORDER — FUROSEMIDE 20 MG PO TABS: ORAL_TABLET | ORAL | 3 refills | Status: DC

## 2018-06-05 MED ORDER — LISINOPRIL 10 MG PO TABS: 10.0000 mg | ORAL_TABLET | Freq: Every day | ORAL | 3 refills | Status: DC

## 2018-06-05 NOTE — Telephone Encounter (Signed)
*  STAT* If patient is at the pharmacy, call can be transferred to refill team.   1. Which medications need to be refilled? (please list name of each medication and dose if known) Coreg, Lasix, Lisinopril  2. Which pharmacy/location (including street and city if local pharmacy) is medication to be sent to? [facility]  3. Do they need a 30 day or 90 day supply? [place]

## 2018-08-15 ENCOUNTER — Ambulatory Visit (INDEPENDENT_AMBULATORY_CARE_PROVIDER_SITE_OTHER): Payer: Medicare Other | Admitting: Obstetrics and Gynecology

## 2018-08-15 ENCOUNTER — Encounter: Payer: Self-pay | Admitting: Obstetrics and Gynecology

## 2018-08-15 VITALS — BP 96/61 | HR 82 | Ht 64.0 in | Wt 141.4 lb

## 2018-08-15 DIAGNOSIS — Z853 Personal history of malignant neoplasm of breast: Secondary | ICD-10-CM | POA: Diagnosis not present

## 2018-08-15 DIAGNOSIS — Z01419 Encounter for gynecological examination (general) (routine) without abnormal findings: Secondary | ICD-10-CM | POA: Diagnosis not present

## 2018-08-15 DIAGNOSIS — Z13820 Encounter for screening for osteoporosis: Secondary | ICD-10-CM

## 2018-08-15 DIAGNOSIS — M85859 Other specified disorders of bone density and structure, unspecified thigh: Secondary | ICD-10-CM | POA: Diagnosis not present

## 2018-08-15 NOTE — Progress Notes (Signed)
Subjective:   Helen Parks is a 67 y.o. G3P0010 Caucasian female here for a routine well-woman exam.  No LMP recorded. Patient is postmenopausal.    Current complaints: none PCP: [place]       doesn't desire labs  Social History: Sexual: heterosexual Marital Status: married Living situation: with spouse Occupation: retired Tobacco/alcohol: no tobacco use Illicit drugs: no history of illicit drug use  The following portions of the patient's history were reviewed and updated as appropriate: allergies, current medications, past family history, past medical history, past social history, past surgical history and problem list.  Past Medical History Past Medical History:  Diagnosis Date  . Breast cancer ([place]) 1997   s/p mastectomy, right breast, chemo, BRCA negative  . Cardiomyopathy   . 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 Chronic systolic congestive heart failure ([place])   . Collagen vascular disease (HCC)    RA  . Diverticulosis of colon (without mention of hemorrhage) April 2012   occurred after colonoscopy, [place]  . Menopause    secondary to chemo for BRCA  . Rheumatoid arthritis ([place])   . Rheumatoid arthritis(714.0)    on Plaquenil methotrxate Precious Reel)    Past Surgical History Past Surgical History:  Procedure Laterality Date  . ANKLE FUSION     right, seondary to erosive arthritis  . BREAST BIOPSY Right 1997   positive for breast ca  . BREAST SURGERY Right 1997   Lumpectomy  . CARDIAC CATHETERIZATION  2010   normal coronary arteries  . COLONOSCOPY WITH PROPOFOL N/A 03/26/2016   Procedure: COLONOSCOPY WITH PROPOFOL;  Surgeon: Lollie Sails, MD;  Location: [facility] ENDOSCOPY;  Service: Endoscopy;  Laterality: N/A;  . FOOT SURGERY  2004  . MASTECTOMY Right 1997  . rheumatology histroy      Gynecologic History G3P0010  No LMP recorded. Patient is postmenopausal. Contraception: post menopausal  status Last Pap: 2018. Results were: normal Last mammogram: 03/2017. Results were: normal   Obstetric History OB History  Gravida Para Term Preterm AB Living  '3 2     1    '$ SAB TAB Ectopic Multiple Live Births               # Outcome Date GA Lbr Len/2nd Weight Sex Delivery Anes PTL Lv  3 Para           2 AB           1 Para             Obstetric Comments  1st Menstrual Cycle:  15  1st Pregnancy:  31    Current Medications Current Outpatient Medications on File Prior to Visit  Medication Sig Dispense Refill  . Calcium 150 MG TABS Take by mouth daily.      . carvedilol (COREG) 6.25 MG tablet TAKE ONE TABLET BY MOUTH 2 TIMES A DAY 180 tablet 3  . cholecalciferol (VITAMIN D) 1000 UNITS tablet Take 2,000 Units by mouth daily.     . folic acid (FOLVITE) 1 MG tablet Take 1 mg by mouth daily.      . furosemide (LASIX) 20 MG tablet TAKE ONE TABLET BY MOUTH 2 TIMES A DAY 180 tablet 3  . hydroxychloroquine (PLAQUENIL) 200 MG tablet Take 200 mg by mouth daily.    Marland Kitchen lisinopril (PRINIVIL,ZESTRIL) 10 MG tablet Take 1 tablet (10 mg total) by mouth daily. 90 tablet 3  . methotrexate 50 MG/2ML injection INJECT  1ML SUBCUTANEOUSLY ONCE A WEEK; takes 25 mg/mL    . spironolactone (ALDACTONE) 25 MG tablet Take 1 tablet (25 mg total) by mouth daily. 90 tablet 1  . mupirocin ointment (BACTROBAN) 2 % Apply bid (Patient not taking: Reported on 08/15/2018) 22 g 2   No current facility-administered medications on file prior to visit.     Review of Systems Patient denies any headaches, blurred vision, shortness of breath, chest pain, abdominal pain, problems with bowel movements, urination, or intercourse.  Objective:  BP 96/61   Pulse 82   Ht '5\' 4"'$  (1.626 m)   Wt 141 lb 6.4 oz (64.1 kg)   BMI 24.27 kg/m  Physical Exam  General:  Well developed, well nourished, no acute distress. She is alert and oriented x3. Skin:  Warm and dry Neck:  Midline trachea, no thyromegaly or nodules Cardiovascular:  Regular rate and rhythm, no murmur heard Lungs:  Effort normal, all lung fields clear to auscultation bilaterally Breasts:  No dominant palpable mass, retraction, or nipple discharge Abdomen:  Soft, non tender, no hepatosplenomegaly or masses Pelvic:  External genitalia is normal in appearance.  The vagina is normal in appearance. The cervix is bulbous, no CMT.  Thin prep pap is not done. Uterus is felt to be normal size, shape, and contour.  No adnexal masses or tenderness noted. Extremities:  No swelling or varicosities noted Psych:  She has a normal mood and affect  Assessment:   Healthy well-woman exam  Plan:  BDS order F/U 1 year for AE, or sooner if needed Mammogram up to date  Colonoscopy up date  Helen Parks, CNM

## 2018-09-13 ENCOUNTER — Telehealth: Payer: Self-pay | Admitting: Obstetrics and Gynecology

## 2018-09-13 NOTE — Telephone Encounter (Signed)
The patient called and stated that she needs Melody or Amy to put in orders for a bone density scan. The patient stated that she was informed to call the office due to difficulty putting the order in. The patient would like a call back if possible to update her on the status of the order being placed. Please advise.

## 2018-10-18 ENCOUNTER — Other Ambulatory Visit: Payer: Self-pay | Admitting: Internal Medicine

## 2018-10-18 DIAGNOSIS — Z13820 Encounter for screening for osteoporosis: Secondary | ICD-10-CM

## 2018-10-30 ENCOUNTER — Telehealth: Payer: Self-pay

## 2018-10-30 ENCOUNTER — Other Ambulatory Visit: Payer: Self-pay

## 2018-10-30 MED ORDER — SPIRONOLACTONE 25 MG PO TABS: 25.0000 mg | ORAL_TABLET | Freq: Every day | ORAL | 3 refills | Status: DC

## 2018-10-30 NOTE — Telephone Encounter (Signed)
Refill sent for Spironolactone 25 mg

## 2018-11-23 NOTE — Telephone Encounter (Signed)
Dr. Doy Hutching ordered

## 2019-03-28 ENCOUNTER — Telehealth: Payer: Self-pay

## 2019-03-28 NOTE — Telephone Encounter (Signed)

## 2019-03-29 ENCOUNTER — Other Ambulatory Visit: Payer: Self-pay

## 2019-03-29 ENCOUNTER — Ambulatory Visit (INDEPENDENT_AMBULATORY_CARE_PROVIDER_SITE_OTHER): Payer: Medicare Other | Admitting: Cardiovascular Disease

## 2019-03-29 ENCOUNTER — Encounter: Payer: Self-pay | Admitting: Cardiovascular Disease

## 2019-03-29 VITALS — BP 90/70 | HR 85 | Temp 97.9°F | Ht 62.0 in | Wt 142.8 lb

## 2019-03-29 DIAGNOSIS — I5022 Chronic systolic (congestive) heart failure: Secondary | ICD-10-CM

## 2019-03-29 NOTE — Patient Instructions (Signed)
Medication Instructions:  Your physician recommends that you continue on your current medications as directed. Please refer to the Current Medication list given to you today.  If you need a refill on your cardiac medications before your next appointment, please call your pharmacy.   Lab work: None ordered If you have labs (blood work) drawn today and your tests are completely normal, you will receive your results only by: Marland Kitchen MyChart Message (if you have MyChart) OR . A paper copy in the mail If you have any lab test that is abnormal or we need to change your treatment, we will call you to review the results.  Testing/Procedures: None ordered  Follow-Up: At [facility], you and your health needs are our priority.  As part of our continuing mission to provide you with exceptional heart care, we have created designated Provider Care Teams.  These Care Teams include your primary Cardiologist (physician) and Advanced Practice Providers (APPs -  Physician Assistants and Nurse Practitioners) who all work together to provide you with the care you need, when you need it. You will need a follow up appointment in 12 months.  Please call our office 2 months in advance to schedule this appointment.  You may see  Dr.Arida or one of the following Advanced Practice Providers on your designated Care Team:   Murray Hodgkins, NP Christell Faith, PA-C . Marrianne Mood, PA-C

## 2019-03-29 NOTE — Progress Notes (Signed)
Cardiology Office Note   Date:  03/29/2019   ID:  Helen Parks, Helen Parks [birth year], MRN [id number]  PCP:  Idelle Crouch, MD  Cardiologist:   Kathlyn Sacramento, MD   Chief Complaint  Patient presents with   office visit    12 mo F/U-CHF      History of Present Illness: Helen Parks is a 68 y.o. 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 She had normal coronary arteries by catheterization in 2010, history of rheumatoid arthritis, breast cancer, status post mastectomy and chemotherapy in 1997 with no radiation therapy with ejection fraction noted to be 30% in 2005.  She had an episode of heart failure in 2010 which was likely triggered by the use of Enbrel for rheumatoid arthritis.  Her initial ejection fraction was 25-30% which gradually improved to 40-45% in June 2016.  Most recent echocardiogram in July 2019 showed an EF of 40 to 45% with mild to moderate mitral regurgitation.  She has been doing extremely well with no recent chest pain, shortness of breath or palpitations.   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[place])    precipitated by use of Enbrel  5366   Chronic systolic congestive heart failure ([facility])    Collagen vascular disease ([place])    RA   Diverticulosis of colon (without mention of hemorrhage) April 2012   occurred after colonoscopy, [place]   Menopause    secondary to chemo for BRCA   Rheumatoid arthritis ([place])    Rheumatoid arthritis(714.0)    on Plaquenil methotrxate Precious Reel)    Past Surgical History:  Procedure Laterality Date   ANKLE FUSION     right, seondary to erosive arthritis   BREAST BIOPSY Right 1997   positive for breast ca   BREAST SURGERY Right 1997   Lumpectomy   CARDIAC CATHETERIZATION  2010   normal coronary arteries   COLONOSCOPY WITH PROPOFOL N/A 03/26/2016   Procedure: COLONOSCOPY WITH PROPOFOL;  Surgeon: Lollie Sails, MD;  Location: [facility] ENDOSCOPY;  Service: Endoscopy;  Laterality: N/A;   FOOT SURGERY  2004   MASTECTOMY Right 1997   rheumatology histroy       Current Outpatient Medications  Medication Sig Dispense Refill   Calcium 150 MG TABS Take by mouth daily.       carvedilol (COREG) 6.25 MG tablet TAKE ONE TABLET BY MOUTH 2 TIMES A DAY 180 tablet 3   cholecalciferol (VITAMIN D) 1000 UNITS tablet Take 2,000 Units by mouth daily.      folic acid (FOLVITE) 1 MG tablet Take 1 mg by mouth daily.       furosemide (LASIX) 20 MG tablet TAKE ONE TABLET BY MOUTH 2 TIMES A DAY 180 tablet 3   hydroxychloroquine (PLAQUENIL) 200 MG tablet Take 200 mg by mouth daily.     lisinopril (PRINIVIL,ZESTRIL) 10 MG tablet Take 1 tablet (10 mg total) by mouth daily. 90 tablet 3   methotrexate 50 MG/2ML injection INJECT 1ML SUBCUTANEOUSLY ONCE A WEEK; takes 25 mg/mL     spironolactone (ALDACTONE) 25 MG tablet Take 1 tablet (25 mg total) by mouth daily. 90 tablet 3   No current facility-administered medications for this visit.     Allergies:   Amoxicillin, Sulfasalazine, and Latex    Social History:  The patient  reports that she has never smoked. She has never used smokeless tobacco. She reports current alcohol use. She reports that she does not use drugs.   Family History:  The patient's family history includes Cancer in her mother and paternal aunt; Heart disease in her father and maternal grandmother.    ROS:  Please see the history of present illness.   Otherwise, review of systems are positive for none.   All other systems are reviewed and negative.    PHYSICAL EXAM: VS:  BP 90/70 (BP Location: Left Arm, Patient Position: Sitting, Cuff Size: Normal)    Pulse 85    Temp 97.9 F (36.6 C)    Ht '5\' 2"'$  (1.575 m)    Wt 142 lb 12 oz (64.8 kg)    SpO2 96%    BMI 26.11  kg/m  , BMI Body mass index is 26.11 kg/m. GEN: Well nourished, well developed, in no acute distress  HEENT: normal  Neck: no JVD, carotid bruits, or masses Cardiac: RRR; no murmurs, rubs, or gallops,no edema  Respiratory:  clear to auscultation bilaterally, normal work of breathing GI: soft, nontender, nondistended, + BS MS: no deformity or atrophy  Skin: warm and dry, no rash Neuro:  Strength and sensation are intact Psych: euthymic mood, full affect   EKG:  EKG is ordered today. The ekg ordered today demonstrates normal sinus rhythm with low voltage.   Recent Labs: No results found for requested labs within last 8760 hours.    Lipid Panel    Component Value Date/Time   CHOL 165 11/02/2016 1520   TRIG 110.0 11/02/2016 1520   TRIG 82 02/04/2010   HDL 55.80 11/02/2016 1520   CHOLHDL 3 11/02/2016 1520   VLDL 22.0 11/02/2016 1520   LDLCALC 87 11/02/2016 1520   LDLDIRECT 87.0 11/02/2016 1520      Wt Readings from Last 3 Encounters:  03/29/19 142 lb 12 oz (64.8 kg)  08/15/18 141 lb 6.4 oz (64.1 kg)  03/17/18 139 lb 4 oz (63.2 kg)        ASSESSMENT AND PLAN:  1.  Chronic systolic heart failure: Due to chemotherapy-induced cardiomyopathy. Most recent ejection fraction was 40-45%. There is no evidence of volume overload on Lasix 20 mg twice daily.  I reviewed recent labs which were unremarkable.  Given that her ejection fraction is about 40% and she tends to be hypotensive, will avoid Entresto at this time unless there is worsening ejection fraction.  2. Rheumatoid arthritis: This seems to be stable on current medications.  3.  mitral regurgitation: This was mild to moderate on most recent echocardiogram from last years with no murmurs by exam.  Disposition:   FU with me in 1 year  Signed,  Kathlyn Sacramento, MD  03/29/2019 3:28 PM    [place]

## 2019-04-13 ENCOUNTER — Other Ambulatory Visit: Payer: Self-pay | Admitting: Internal Medicine

## 2019-04-13 DIAGNOSIS — Z1231 Encounter for screening mammogram for malignant neoplasm of breast: Secondary | ICD-10-CM

## 2019-05-16 ENCOUNTER — Ambulatory Visit
Admission: RE | Admit: 2019-05-16 | Discharge: 2019-05-16 | Disposition: A | Discharge status: Home / Self Care | Payer: Medicare Other | Source: Ambulatory Visit | Attending: Internal Medicine | Admitting: Internal Medicine

## 2019-05-16 DIAGNOSIS — Z1231 Encounter for screening mammogram for malignant neoplasm of breast: Secondary | ICD-10-CM | POA: Insufficient documentation

## 2019-05-31 ENCOUNTER — Other Ambulatory Visit: Payer: Self-pay | Admitting: *Deleted

## 2019-05-31 MED ORDER — FUROSEMIDE 20 MG PO TABS: ORAL_TABLET | ORAL | 3 refills | Status: DC

## 2019-05-31 MED ORDER — LISINOPRIL 10 MG PO TABS: 10.0000 mg | ORAL_TABLET | Freq: Every day | ORAL | 3 refills | Status: DC

## 2019-06-11 ENCOUNTER — Other Ambulatory Visit: Payer: Self-pay

## 2019-06-11 MED ORDER — CARVEDILOL 6.25 MG PO TABS: ORAL_TABLET | ORAL | 3 refills | Status: DC

## 2019-08-17 ENCOUNTER — Encounter: Payer: Medicare Other | Admitting: Obstetrics and Gynecology

## 2019-08-26 ENCOUNTER — Other Ambulatory Visit: Payer: Self-pay | Admitting: Cardiovascular Disease

## 2019-10-04 ENCOUNTER — Other Ambulatory Visit: Payer: Self-pay

## 2019-10-04 ENCOUNTER — Other Ambulatory Visit: Payer: Self-pay | Admitting: Physician Assistant

## 2019-10-04 ENCOUNTER — Ambulatory Visit
Admission: RE | Admit: 2019-10-04 | Discharge: 2019-10-04 | Disposition: A | Discharge status: Home / Self Care | Payer: Medicare Other | Source: Ambulatory Visit | Attending: Physician Assistant | Admitting: Physician Assistant

## 2019-10-04 DIAGNOSIS — M7989 Other specified soft tissue disorders: Secondary | ICD-10-CM

## 2019-10-20 ENCOUNTER — Ambulatory Visit: Payer: No Typology Code available for payment source

## 2019-10-21 ENCOUNTER — Ambulatory Visit: Payer: Medicare Other | Attending: Internal Medicine

## 2019-10-21 DIAGNOSIS — Z23 Encounter for immunization: Secondary | ICD-10-CM | POA: Insufficient documentation

## 2019-10-21 NOTE — Progress Notes (Signed)
   Covid-19 Vaccination Clinic  Name:  Helen Parks    MRN: [id number] DOB: 08-08-1951  10/21/2019  [name] was observed post Covid-19 immunization for 15 minutes without incidence. She was provided with Vaccine Information Sheet and instruction to access the V-Safe system.   [name] was instructed to call 911 with any severe reactions post vaccine: Marland Kitchen Difficulty breathing  . Swelling of your face and throat  . A fast heartbeat  . A bad rash all over your body  . Dizziness and weakness    Immunizations Administered    Name Date Dose VIS Date Route   Pfizer COVID-19 Vaccine 10/21/2019  2:07 PM 0.3 mL 08/10/2019 Intramuscular   Manufacturer: [place]   Lot: [id number]   [place]: [id number]

## 2019-11-14 ENCOUNTER — Ambulatory Visit: Payer: Medicare Other | Attending: Internal Medicine

## 2019-11-14 DIAGNOSIS — Z23 Encounter for immunization: Secondary | ICD-10-CM

## 2019-11-14 NOTE — Progress Notes (Signed)
   Covid-19 Vaccination Clinic  Name:  Helen Parks    MRN: [id number] DOB: 05/05/1951  11/14/2019  [name] was observed post Covid-19 immunization for 15 minutes without incident. She was provided with Vaccine Information Sheet and instruction to access the V-Safe system.   [name] was instructed to call 911 with any severe reactions post vaccine: Marland Kitchen Difficulty breathing  . Swelling of face and throat  . A fast heartbeat  . A bad rash all over body  . Dizziness and weakness   Immunizations Administered    Name Date Dose VIS Date Route   Pfizer COVID-19 Vaccine 11/14/2019  9:58 AM 0.3 mL 08/10/2019 Intramuscular   Manufacturer: [place]   Lot: [id number]   [place]: [id number]

## 2019-11-27 ENCOUNTER — Other Ambulatory Visit: Payer: Self-pay

## 2019-11-27 ENCOUNTER — Encounter: Payer: Self-pay | Admitting: Occupational Therapy

## 2019-11-27 ENCOUNTER — Ambulatory Visit: Payer: Medicare Other | Attending: Rheumatology | Admitting: Occupational Therapy

## 2019-11-27 DIAGNOSIS — M6281 Muscle weakness (generalized): Secondary | ICD-10-CM | POA: Diagnosis present

## 2019-11-27 DIAGNOSIS — I972 Postmastectomy lymphedema syndrome: Secondary | ICD-10-CM | POA: Insufficient documentation

## 2019-11-28 NOTE — Therapy (Signed)
[place] [facility] [address] [place], [address] Phone: [phone]   Fax:  [phone]  Occupational Therapy Evaluation  Patient Details  Name: ANNSLEY AKKERMAN MRN: [id number] Date of Birth: [birth year] Referring Provider (OT): [place], [place]   Encounter Date: 11/27/2019  OT End of Session - 11/28/19 1830    Visit Number  1    Number of Visits  24    Date for OT Re-Evaluation  01/24/20    OT Start Time  1100    OT Stop Time  1208    OT Time Calculation (min)  68 min    Activity Tolerance  Patient tolerated treatment well    Behavior During Therapy  [facility] for tasks assessed/performed       Past Medical History:  Diagnosis Date  . Breast cancer ([place]) 1997   s/p mastectomy, right breast, chemo, BRCA negative  . Cardiomyopathy   . 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 Chronic systolic congestive heart failure ([place])   . Collagen vascular disease (H[facility])    RA  . Diverticulosis of colon (without mention of hemorrhage) April 2012   occurred after colonoscopy, [place]  . Menopause    secondary to chemo for BRCA  . Rheumatoid arthritis ([place])   . Rheumatoid arthritis(714.0)    on Plaquenil methotrxate Precious Reel)    Past Surgical History:  Procedure Laterality Date  . ANKLE FUSION     right, seondary to erosive arthritis  . BREAST BIOPSY Right 1997   positive for breast ca  . BREAST SURGERY Right 1997   Lumpectomy  . CARDIAC CATHETERIZATION  2010   normal coronary arteries  . COLONOSCOPY WITH PROPOFOL N/A 03/26/2016   Procedure: COLONOSCOPY WITH PROPOFOL;  Surgeon: Lollie Sails, MD;  Location: [facility] END[facility]COPY;  Service: Endoscopy;  Laterality: N/A;  . FOOT SURGERY  2004  . MASTECTOMY Right 1997  . rheumatology histroy      There were no vitals filed for this visit.  Subjective Assessment - 11/27/19 1110    Subjective   Patient reports she had breast  cancer in 1997 with mastectomy and 18 lymph nodes removed.  Also has RA, especially in wrists, feet and shoulders are worse at times.  Patient reports she had compression sleeves and a Reid sleeve years ago and got rid them.  Pt reports she had an onset of edema in right arm in wrist, forearm to elbow and thought it was related to taking a new medication however she reports she has discontinued the medication weeks ago and still has edema present.    Pertinent History  Patient with a history of right breast cancer s/p mastectomy and 18 lymph node removal.  She reports she was treated in the past for lymphedema years ago but felt her symptoms resolved and therefore got rid of her Reid sleeve and daytime compression sleeves a long time ago.Patient with recent increase in edema in right wrist, forearm to elbow.    Patient Stated Goals  Patient would like to manage the swelling in her right arm.    Currently in Pain?  No/denies    Multiple Pain Sites  No        [facility] OT Assessment - 11/28/19 0001      Assessment   Medical Diagnosis  edema in right UE    Referring Provider (OT)  Jefm Bryant, [place]  Right    Prior Therapy  yes      ADL   ADL comments  Patient reports she is limited by her RA with some tasks.  Husband helps with vacuuming, cleaning tub.        IADL   Prior Level of Function Shopping  independent    Shopping  Takes care of all shopping needs independently    Prior Level of Function Light Housekeeping  independent    Light Housekeeping  Does personal laundry completely;Performs light daily tasks such as dishwashing, bed making    Prior Level of Function Meal Prep  independent    Meal Prep  Plans, prepares and serves adequate meals independently    Prior Level of Function Community Mobility  independent    Community Mobility  Drives own vehicle    Prior Level of Function Medication Managment  independent    Medication Management  Is responsible for taking medication in  correct dosages at correct time    Prior Level of Function Financial Management  independent    Psychiatrist financial matters independently (budgets, writes checks, pays rent, bills goes to bank), collects and keeps track of income      Mobility   Mobility Status  Independent      Written Expression   Dominant Hand  Right      Sensation   Light Touch  Appears Intact      Coordination   Gross Motor Movements are Fluid and Coordinated  Yes      AROM   Overall AROM   Within functional limits for tasks performed    Overall AROM Comments  Right shoulder flexion 138 degrees, left 145 degrees      Strength   Overall Strength Comments  Patient with overall strength 4/5 UE    Right Hand Grip (lbs)  40    Left Hand Grip (lbs)  40       LYMPHEDEMA/ONCOLOGY QUESTIONNAIRE - 11/28/19 1824      Type   Cancer Type  breast cancer right      Surgeries   Mastectomy Date  --   1997     Treatment   Active Chemotherapy Treatment  No    Past Chemotherapy Treatment  Yes    Active Radiation Treatment  No    Past Radiation Treatment  No    Current Hormone Treatment  No      What other symptoms do you have   Are you Having Heaviness or Tightness  Yes    Are you having Pain  No    Is it Hard or Difficult finding clothes that fit  No    Do you have infections  No      Right Upper Extremity Lymphedema   At [place]   29.7 cm    15 cm Proximal to Olecranon Process  29.4 cm    10 cm Proximal to Olecranon Process  28.4 cm    Olecranon Process  25 cm    15 cm Proximal to Ulnar Styloid Process  24.9 cm    10 cm Proximal to Ulnar Styloid Process  22.5 cm    Just Proximal to Ulnar Styloid Process  17 cm    Across Hand at [employer]  19.1 cm    At [place] of 2nd Digit  6.6 cm    At [facility] of [place]  6.8 cm      Left Upper Extremity Lymphedema   15 cm Proximal to  Olecranon Process  29 cm    10 cm Proximal to Olecranon Process  29.1 cm    Olecranon Process  24.3 cm    15 cm  Proximal to Ulnar Styloid Process  22.3 cm    10 cm Proximal to Ulnar Styloid Process  19.6 cm    Just Proximal to Ulnar Styloid Process  15.3 cm    Across Hand at [employer]  17.6 cm    At [place] of 2nd Digit  6.5 cm    At [facility] of Thumb  6.4 cm       Patient seen for manual lymphatic drainage techniques to right UE and associated pathways per clinical protocol.  Instructed patient on technique for use at home, patient will require additional instruction to become proficient.   Instructed on use of light compression until she can return for potential compression wrapping.   Isotoner glove to right hand, tubi grip D to hand to elbow, tubigrip E from hand to upper arm. Patient to wear during the day and at night, removal for short periods of time for hygiene and during homemaking tasks.   Will recheck next session to see if symptoms persist and if patient responds to light compression and MLD at home.               OT Education - 11/28/19 1829    Education Details  plan of care, role of OT, manual lymphatic drainage, use of tubigrip    Person(s) Educated  Patient    Methods  Explanation;Demonstration    Comprehension  Returned demonstration;Verbalized understanding          OT Long Term Goals - 11/28/19 1842      OT LONG TERM GOAL #1   Title  Patient will demonstrate independence in home program for MLD, use of compression and exercises.    Baseline  no current program    Time  8    Period  Weeks    Status  New    Target Date  01/24/20      OT LONG TERM GOAL #2   Title  Patient will demonstrate a decrease in circumferential measurements in right wrist, forearm and elbow by 2 cm to prepare for compression garment fitting.    Baseline  no current compression    Time  8    Period  Weeks    Status  New    Target Date  01/24/20      OT LONG TERM GOAL #3   Title  Patient to be independent with use of compression garments to manage symptoms of lymphedema.     Baseline  no garments at eval    Time  8    Period  Weeks    Status  New    Target Date  01/24/20            Plan - 11/28/19 1835    Clinical Impression Statement  Patient is a 69 yo female diagnosed with right breast cancer in 1997 s/p mastectomy with 18 lymph nodes removed and chemo.  Patient was diagnosed with lymphedema years ago and received treatment however she reported her edema resolved and she was no longer using any type of compression.  She reports an increase in edema in her right hand, wrist, forearm to elbow around Dec/ Jan 2021 and thought it was related to a change in medication.  She reports she has discontinued the medication and still has edema present.  Patient seen  for OT evaluation and presents with edema in right UE, muscle weakness and would benefit from a comprehensive lymphedema treatment program including compression bandaging, skin care, MLD, and exercises.    OT Occupational Profile and History  Detailed Assessment- Review of Records and additional review of physical, cognitive, psychosocial history related to current functional performance    Body Structure / Function / Physical Skills  ADL;Decreased knowledge of use of DME;Strength;Edema;UE functional use;IADL;ROM    Rehab Potential  Excellent    Clinical Decision Making  Limited treatment options, no task modification necessary    Comorbidities Affecting Occupational Performance:  May have comorbidities impacting occupational performance    Modification or Assistance to Complete Evaluation   No modification of tasks or assist necessary to complete eval    OT Frequency  3x / week    OT Duration  8 weeks    OT Treatment/Interventions  Self-care/ADL training;DME and/or AE instruction;Manual lymph drainage;Compression bandaging;Therapeutic activities;Therapeutic exercise;Manual Therapy;Patient/family education;Neuromuscular education    Consulted and Agree with Plan of Care  Patient       Patient will benefit  from skilled therapeutic intervention in order to improve the following deficits and impairments:   Body Structure / Function / Physical Skills: ADL, Decreased knowledge of use of DME, Strength, Edema, UE functional use, IADL, ROM       Visit Diagnosis: Postmastectomy lymphedema syndrome  Muscle weakness (generalized)    Problem List Patient Active Problem List   Diagnosis Date Noted  . Diverticulitis of large intestine without perforation or abscess without bleeding 05/19/2016  . Left sided abdominal pain 12/29/2015  . Fatigue 09/05/2015  . Visit for preventive health examination 02/27/2015  . Hiatal hernia 02/27/2015  . Unspecified vitamin D deficiency 02/23/2014  . Chronic systolic congestive heart failure ([place])   . Screening for cervical cancer 09/15/2011  . Screening for colon cancer 09/15/2011  . Diverticulosis of colon (without mention of hemorrhage)   . Menopause   . Rheumatoid arthritis(714.0)   . History of right breast cancer   . EDEMA 07/15/2010  . CARDIOMYOPATHY, PRIMARY, DILATED 11/14/2009   Hyde Sires T Gurman [place], OTR/L, CLT  Heywood Tokunaga 11/28/2019, 6:54 PM  [place] [place] [facility] [address], [place], [address] Phone: [phone]   Fax:  [phone]  Name: AHRIANNA SIGLIN MRN: [id number] Date of Birth: [birth year]

## 2019-12-10 ENCOUNTER — Other Ambulatory Visit: Payer: Self-pay

## 2019-12-10 ENCOUNTER — Telehealth: Payer: Self-pay | Admitting: Cardiovascular Disease

## 2019-12-10 ENCOUNTER — Ambulatory Visit: Payer: Medicare Other | Attending: Rheumatology | Admitting: Occupational Therapy

## 2019-12-10 DIAGNOSIS — I890 Lymphedema, not elsewhere classified: Secondary | ICD-10-CM | POA: Diagnosis present

## 2019-12-10 DIAGNOSIS — M6281 Muscle weakness (generalized): Secondary | ICD-10-CM | POA: Diagnosis present

## 2019-12-10 DIAGNOSIS — I972 Postmastectomy lymphedema syndrome: Secondary | ICD-10-CM | POA: Insufficient documentation

## 2019-12-10 NOTE — Therapy (Signed)
[place] [facility] [address] [place], [address] Phone: [phone]   Fax:  [phone]  Occupational Therapy Treatment  Patient Details  Name: Helen Parks MRN: [id number] Date of Birth: [birth year] Referring Provider (OT): [place], [place]   Encounter Date: 12/10/2019  OT End of Session - 12/10/19 1542    Visit Number  2    Number of Visits  24    Date for OT Re-Evaluation  01/24/20    OT Start Time  1400    OT Stop Time  1434    OT Time Calculation (min)  34 min    Activity Tolerance  Patient tolerated treatment well    Behavior During Therapy  [facility] for tasks assessed/performed       Past Medical History:  Diagnosis Date  . Breast cancer ([place]) 1997   s/p mastectomy, right breast, chemo, BRCA negative  . Cardiomyopathy   . 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 Chronic systolic congestive heart failure ([place])   . Collagen vascular disease (H[facility])    RA  . Diverticulosis of colon (without mention of hemorrhage) April 2012   occurred after colonoscopy, [place]  . Menopause    secondary to chemo for BRCA  . Rheumatoid arthritis ([place])   . Rheumatoid arthritis(714.0)    on Plaquenil methotrxate Precious Reel)    Past Surgical History:  Procedure Laterality Date  . ANKLE FUSION     right, seondary to erosive arthritis  . BREAST BIOPSY Right 1997   positive for breast ca  . BREAST SURGERY Right 1997   Lumpectomy  . CARDIAC CATHETERIZATION  2010   normal coronary arteries  . COLONOSCOPY WITH PROPOFOL N/A 03/26/2016   Procedure: COLONOSCOPY WITH PROPOFOL;  Surgeon: Lollie Sails, MD;  Location: [facility] ENDOSCOPY;  Service: Endoscopy;  Laterality: N/A;  . FOOT SURGERY  2004  . MASTECTOMY Right 1997  . rheumatology histroy      There were no vitals filed for this visit.  Subjective Assessment - 12/10/19 1541    Subjective   I could not remember the steps  of the massage - so did little and then I wore the compression during night time but not really during day - because I use my hands a lot    Pertinent History  Patient with a history of right breast cancer s/p mastectomy and 18 lymph node removal.  She reports she was treated in the past for lymphedema years ago but felt her symptoms resolved and therefore got rid of her Reid sleeve and daytime compression sleeves a long time ago.Patient with recent increase in edema in right wrist, forearm to elbow.    Patient Stated Goals  Patient would like to manage the swelling in her right arm.    Currently in Pain?  No/denies          LYMPHEDEMA/ONCOLOGY QUESTIONNAIRE - 12/10/19 1407      Right Upper Extremity Lymphedema   15 cm Proximal to Olecranon Process  29.8 cm    10 cm Proximal to Olecranon Process  30.2 cm    Olecranon Process  25 cm    15 cm Proximal to Ulnar Styloid Process  25 cm    10 cm Proximal to Ulnar Styloid Process  22 cm    Just Proximal to Ulnar Styloid Process  17.2 cm    Across Hand at [employer]  18.3 cm  At [facility] of 2nd Digit  6.4 cm    At [facility] of Thumb  6.8 cm       Pt cont to have increase circumference in R wrist and forearm - see flowsheet  Increase by 1.9 at wrist , forearm 2.4 to 2.7 cm  Pt report she wore isotoner glove and tubigrip from hand and armpit mostly night time  But not during day - use her hand to much  Reinforce with pt that need to wear isotoner glove and tubigrip D hand to elbow day and night time and then MLD - review MLD with her again - and hand out provided:    Do manual lymph drainage once each day to help decrease swelling.  This should take you about 30 minutes depending on the size of your limb.  For Right Arm:  Felicie Morn yourself at the base of your neck and do 8 small circles, and 2 fingers behind clavicle 8 x  . Do 8 semicircles at left armpit and right groin . Pump across chest from right to left 8 times . Pump down the right  side of trunk from armpit to groin 8 times . Pump up the outside of right upper arm 8 times  . Pump across chest from R to L 8 times . Pump  down the right side of trunk from armpit to groin 8 times . Pump top of forearm from wrist to elbow 8 times . Pump up the outside of right upper arm 8 times . Pump across chest from right to left 8 times . Pump down the right side of trunk from armpit to groin 8 times . Pump up the back of the forearm from wrist to elbow 8 times . Pump up the outside of right upper arm 8 times . Pump across chest from right to left 8 times . Pump down the right side of trunk from armpit to groin 8 times . Do 8 semicircles at left armpit and right groin 8 times . Repeat nr.1  SLOW and LIGHT with only your palm NOT FINGERTIPS If help at home can replace or do also massage over upper back from right to left , when doing chest from right to left.                 OT Education - 12/10/19 1541    Education Details  progress and changes to HEP - including review MLD    Person(s) Educated  Patient    Methods  Explanation;Demonstration;Handout    Comprehension  Returned demonstration;Verbalized understanding          OT Long Term Goals - 11/28/19 1842      OT LONG TERM GOAL #1   Title  Patient will demonstrate independence in home program for MLD, use of compression and exercises.    Baseline  no current program    Time  8    Period  Weeks    Status  New    Target Date  01/24/20      OT LONG TERM GOAL #2   Title  Patient will demonstrate a decrease in circumferential measurements in right wrist, forearm and elbow by 2 cm to prepare for compression garment fitting.    Baseline  no current compression    Time  8    Period  Weeks    Status  New    Target Date  01/24/20      OT LONG TERM GOAL #3  Title  Patient to be independent with use of compression garments to manage symptoms of lymphedema.    Baseline  no garments at eval    Time  8     Period  Weeks    Status  New    Target Date  01/24/20            Plan - 12/10/19 1542    Clinical Impression Statement  Pt was evaluated more than week ago -and increase lymphedema in R UE - after diagnosis 1997 and had Reidsleeve and compression then but it resolved and did not wear anything for more than 20 years - pt this date show progress in hand and upper arm - but wrist and forearm still increase 2.4 to 2.7 cm - reinforce importance to wear day and night time isotoner glove with tubigrip to elbow and then MLD reviewed with her to do 2 x day    OT Occupational Profile and History  Detailed Assessment- Review of Records and additional review of physical, cognitive, psychosocial history related to current functional performance    Occupational performance deficits (Please refer to evaluation for details):  IADL's;ADL's    Body Structure / Function / Physical Skills  ADL;Decreased knowledge of use of DME;Strength;Edema;UE functional use;IADL;ROM    Rehab Potential  Excellent    Clinical Decision Making  Limited treatment options, no task modification necessary    Comorbidities Affecting Occupational Performance:  May have comorbidities impacting occupational performance    Modification or Assistance to Complete Evaluation   No modification of tasks or assist necessary to complete eval    OT Frequency  1x / week    OT Duration  6 weeks    OT Treatment/Interventions  Self-care/ADL training;DME and/or AE instruction;Manual lymph drainage;Compression bandaging;Therapeutic activities;Therapeutic exercise;Manual Therapy;Patient/family education;Neuromuscular education    Plan  progress with compression and MLD    Consulted and Agree with Plan of Care  Patient       Patient will benefit from skilled therapeutic intervention in order to improve the following deficits and impairments:   Body Structure / Function / Physical Skills: ADL, Decreased knowledge of use of DME, Strength, Edema, UE  functional use, IADL, ROM       Visit Diagnosis: Postmastectomy lymphedema syndrome  Lymphedema, not elsewhere classified    Problem List Patient Active Problem List   Diagnosis Date Noted  . Diverticulitis of large intestine without perforation or abscess without bleeding 05/19/2016  . Left sided abdominal pain 12/29/2015  . Fatigue 09/05/2015  . Visit for preventive health examination 02/27/2015  . Hiatal hernia 02/27/2015  . Unspecified vitamin D deficiency 02/23/2014  . Chronic systolic congestive heart failure ([place])   . Screening for cervical cancer 09/15/2011  . Screening for colon cancer 09/15/2011  . Diverticulosis of colon (without mention of hemorrhage)   . Menopause   . Rheumatoid arthritis(714.0)   . History of right breast cancer   . EDEMA 07/15/2010  . CARDIOMYOPATHY, PRIMARY, DILATED 11/14/2009    Rosalyn Gess OTR/L,CLT 12/10/2019, 3:47 PM  [place] [facility] [address], [place], [address] Phone: [phone]   Fax:  [phone]  Name: Helen Parks MRN: [id number] Date of Birth: June 03, 1951

## 2019-12-10 NOTE — Telephone Encounter (Signed)
Patient made aware of Dr. Tyrell Antonio response and recommendation. Patient verbalized understanding.  Patient sts that she has been having intermittent LE edema. She does take her Lasix prn for swelling. She denies SOB, orthopnea, PND. Advised the patient to elevate her legs as much as possible to to try wearing compression stockings daily. Patient sts that she is due for her yearly f/u with Dr. Fletcher Anon. Appt scheduled for 12/20/19 @ 8:40am with Dr. Fletcher Anon. Patient is aware of the appt date an time. Patient verbalized understanding to the info given and voiced appreciation for the assistance.

## 2019-12-10 NOTE — Telephone Encounter (Signed)
I reviewed this medication and it should be safe from a cardiac standpoint with no significant interaction with her heart medications.

## 2019-12-10 NOTE — Patient Instructions (Signed)
See note

## 2019-12-10 NOTE — Telephone Encounter (Signed)
Please call to discuss [place], a medication her rheumatologist would like to start her on. This is an infusion medication. Would like to discuss if this will affect her heart medications.  Pt c/o swelling: STAT is pt has developed SOB within 24 hours  1) How much weight have you gained and in what time span? States her weight increases by the end of the day anyway  2) If swelling, where is the swelling located? Bilateral legs, especially in right leg  3) Are you currently taking a fluid pill? Lasix  4) Are you currently SOB? no  5) Do you have a log of your daily weights (if so, list)? No log  6) Have you gained 3 pounds in a day or 5 pounds in a week? Not sure  7) Have you traveled recently? no

## 2019-12-17 ENCOUNTER — Ambulatory Visit: Payer: Medicare Other | Admitting: Occupational Therapy

## 2019-12-17 ENCOUNTER — Other Ambulatory Visit: Payer: Self-pay

## 2019-12-17 DIAGNOSIS — I972 Postmastectomy lymphedema syndrome: Secondary | ICD-10-CM

## 2019-12-17 DIAGNOSIS — I890 Lymphedema, not elsewhere classified: Secondary | ICD-10-CM

## 2019-12-17 DIAGNOSIS — M6281 Muscle weakness (generalized): Secondary | ICD-10-CM

## 2019-12-17 NOTE — Therapy (Signed)
[facility] [address] [place], [address] Phone: [phone]   Fax:  [phone]  Occupational Therapy Treatment  Patient Details  Name: Helen Parks MRN: [id number] Date of Birth: Jun 25, 1951 Referring Provider (OT): [place], [place]   Encounter Date: 12/17/2019  OT End of Session - 12/17/19 1335    Visit Number  3    Number of Visits  24    Date for OT Re-Evaluation  01/24/20    OT Start Time  5277    OT Stop Time  1330    OT Time Calculation (min)  32 min    Activity Tolerance  Patient tolerated treatment well    Behavior During Therapy  [facility] for tasks assessed/performed       Past Medical History:  Diagnosis Date  . Breast cancer ([place]) 1997   s/p mastectomy, right breast, chemo, BRCA negative  . Cardiomyopathy   . 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 Chronic systolic congestive heart failure ([place])   . Collagen vascular disease (HCC)    RA  . Diverticulosis of colon (without mention of hemorrhage) April 2012   occurred after colonoscopy, [place]  . Menopause    secondary to chemo for BRCA  . Rheumatoid arthritis ([place])   . Rheumatoid arthritis(714.0)    on Plaquenil methotrxate Precious Reel)    Past Surgical History:  Procedure Laterality Date  . ANKLE FUSION     right, seondary to erosive arthritis  . BREAST BIOPSY Right 1997   positive for breast ca  . BREAST SURGERY Right 1997   Lumpectomy  . CARDIAC CATHETERIZATION  2010   normal coronary arteries  . COLONOSCOPY WITH PROPOFOL N/A 03/26/2016   Procedure: COLONOSCOPY WITH PROPOFOL;  Surgeon: Lollie Sails, MD;  Location: [facility] ENDOSCOPY;  Service: Endoscopy;  Laterality: N/A;  . FOOT SURGERY  2004  . MASTECTOMY Right 1997  . rheumatology histroy      There were no vitals filed for this visit.  Subjective Assessment - 12/17/19 1334    Subjective   I did wear the compression about  4-5 hrs during day - and night time - I had my grandkids this weekend and used my hand more - has RA and my wrist bother me at times and sore at night time    Pertinent History  Patient with a history of right breast cancer s/p mastectomy and 18 lymph node removal.  She reports she was treated in the past for lymphedema years ago but felt her symptoms resolved and therefore got rid of her Reid sleeve and daytime compression sleeves a long time ago.Patient with recent increase in edema in right wrist, forearm to elbow.    Patient Stated Goals  Patient would like to manage the swelling in her right arm.    Currently in Pain?  No/denies          LYMPHEDEMA/ONCOLOGY QUESTIONNAIRE - 12/17/19 1301      Right Upper Extremity Lymphedema   15 cm Proximal to Olecranon Process  29.5 cm    10 cm Proximal to Olecranon Process  28.2 cm    Olecranon Process  24.8 cm    15 cm Proximal to Ulnar Styloid Process  24.4 cm    10 cm Proximal to Ulnar Styloid Process  22.3 cm    Just Proximal to Ulnar Styloid Process  17.8 cm    Across Hand at  Thumb Web Space  18 cm    At [place] of 2nd Digit  6.3 cm    At [facility] of Thumb  6.4 cm         Measurement taken - see flow sheet- did decrease in R UE but cont to have increase circumference in R wrist and forearm - see flowsheet  Increase by 2.1 to 2.7 cm compare to L UE   Pt report she wore isotoner glove and tubigrip from hand to elbow 4-5 hrs during day and  night time   Reinforce with pt that need to wear isotoner glove and tubigrip D hand to elbow during day  And add  tubigrip E from midforearm to axilla for night time  - and 5 cm short stretch from hand to upper arm over tubigrip  Cont with  MLD - review MLD with her again - and hand out provided last time   Do manual lymph drainage once each day to help decrease swelling.  This should take you about 30 minutes depending on the size of your limb.  For Right Arm:   Hug yourself at the base of your neck  and do 8 small circles, and 2 fingers behind clavicle 8 x   Do 8 semicircles at left armpit and right groin  Pump across chest from right to left 8 times  Pump down the right side of trunk from armpit to groin 8 times  Pump up the outside of right upper arm 8 times   Pump across chest from R to L 8 times  Pump  down the right side of trunk from armpit to groin 8 times  Pump top of forearm from wrist to elbow 8 times  Pump up the outside of right upper arm 8 times  Pump across chest from right to left 8 times  Pump down the right side of trunk from armpit to groin 8 times  Pump up the back of the forearm from wrist to elbow 8 times  Pump up the outside of right upper arm 8 times  Pump across chest from right to left 8 times  Pump down the right side of trunk from armpit to groin 8 times  Do 8 semicircles at left armpit and right groin 8 times  Repeat nr.1  SLOW and LIGHT with only your palm NOT FINGERTIPS If help at home can replace or do also massage over upper back from right to left , when doing chest from right to left.         Pt had history of RA -and used her hands a lot with grand kids this weekend and have at night time increase wrist pain at times - use wrist splint if needed  Could explain why wrist is increase more this visit         OT Education - 12/17/19 1335    Education Details  progress and changes to HEP    Person(s) Educated  Patient    Methods  Explanation;Demonstration;Handout    Comprehension  Returned demonstration;Verbalized understanding          OT Long Term Goals - 11/28/19 1842      OT LONG TERM GOAL #1   Title  Patient will demonstrate independence in home program for MLD, use of compression and exercises.    Baseline  no current program    Time  8    Period  Weeks    Status  New    Target Date  01/24/20      OT LONG TERM GOAL #2   Title  Patient will demonstrate a decrease in circumferential measurements in  right wrist, forearm and elbow by 2 cm to prepare for compression garment fitting.    Baseline  no current compression    Time  8    Period  Weeks    Status  New    Target Date  01/24/20      OT LONG TERM GOAL #3   Title  Patient to be independent with use of compression garments to manage symptoms of lymphedema.    Baseline  no garments at eval    Time  8    Period  Weeks    Status  New    Target Date  01/24/20            Plan - 12/17/19 1336    Clinical Impression Statement  Pt did wear the last week during day 4-5 hrs isotoner glove and tubigrip D - done MLD - did decrease except wrist and forearm still increased - add this date tubigrip E from mid forearm to upper arm for night time with 5 cm short stretch bandage from hand to upper arm circular wrap to use at night time - still increase by 2.1 to 2.7 cm wrist and forearm compare to L UE - pt to return in week    OT Occupational Profile and History  Detailed Assessment- Review of Records and additional review of physical, cognitive, psychosocial history related to current functional performance    Occupational performance deficits (Please refer to evaluation for details):  IADL's;ADL's    Body Structure / Function / Physical Skills  ADL;Decreased knowledge of use of DME;Strength;Edema;UE functional use;IADL;ROM    Rehab Potential  Excellent    Clinical Decision Making  Limited treatment options, no task modification necessary    Comorbidities Affecting Occupational Performance:  May have comorbidities impacting occupational performance    Modification or Assistance to Complete Evaluation   No modification of tasks or assist necessary to complete eval    OT Frequency  1x / week    OT Duration  4 weeks    OT Treatment/Interventions  Self-care/ADL training;DME and/or AE instruction;Manual lymph drainage;Compression bandaging;Therapeutic activities;Therapeutic exercise;Manual Therapy;Patient/family education;Neuromuscular education     Plan  progress with compression and MLD    Consulted and Agree with Plan of Care  Patient       Patient will benefit from skilled therapeutic intervention in order to improve the following deficits and impairments:   Body Structure / Function / Physical Skills: ADL, Decreased knowledge of use of DME, Strength, Edema, UE functional use, IADL, ROM       Visit Diagnosis: Lymphedema, not elsewhere classified  Muscle weakness (generalized)  Postmastectomy lymphedema syndrome    Problem List Patient Active Problem List   Diagnosis Date Noted  . Diverticulitis of large intestine without perforation or abscess without bleeding 05/19/2016  . Left sided abdominal pain 12/29/2015  . Fatigue 09/05/2015  . Visit for preventive health examination 02/27/2015  . Hiatal hernia 02/27/2015  . Unspecified vitamin D deficiency 02/23/2014  . Chronic systolic congestive heart failure ([place])   . Screening for cervical cancer 09/15/2011  . Screening for colon cancer 09/15/2011  . Diverticulosis of colon (without mention of hemorrhage)   . Menopause   . Rheumatoid arthritis(714.0)   . History of right breast cancer   . EDEMA 07/15/2010  . CARDIOMYOPATHY, PRIMARY, DILATED 11/14/2009    Rosalyn Gess  OTR/l,CLT 12/17/2019, 1:39 PM  [facility] [address], [place], [address] Phone: [phone]   Fax:  [phone]  Name: Helen Parks MRN: [id number] Date of Birth: 1951-08-03

## 2019-12-17 NOTE — Patient Instructions (Signed)
See note

## 2019-12-20 ENCOUNTER — Encounter: Payer: Self-pay | Admitting: Cardiovascular Disease

## 2019-12-20 ENCOUNTER — Other Ambulatory Visit: Payer: Self-pay

## 2019-12-20 ENCOUNTER — Ambulatory Visit (INDEPENDENT_AMBULATORY_CARE_PROVIDER_SITE_OTHER): Payer: Medicare Other | Admitting: Cardiovascular Disease

## 2019-12-20 VITALS — BP 108/70 | HR 74 | Ht 62.0 in | Wt 147.1 lb

## 2019-12-20 DIAGNOSIS — I5022 Chronic systolic (congestive) heart failure: Secondary | ICD-10-CM

## 2019-12-20 DIAGNOSIS — I340 Nonrheumatic mitral (valve) insufficiency: Secondary | ICD-10-CM

## 2019-12-20 NOTE — Patient Instructions (Signed)
Medication Instructions:  Your physician recommends that you continue on your current medications as directed. Please refer to the Current Medication list given to you today.  *If you need a refill on your cardiac medications before your next appointment, please call your pharmacy*   Lab Work: None ordered If you have labs (blood work) drawn today and your tests are completely normal, you will receive your results only by: Marland Kitchen MyChart Message (if you have MyChart) OR . A paper copy in the mail If you have any lab test that is abnormal or we need to change your treatment, we will call you to review the results.   Testing/Procedures: Your physician has requested that you have an echocardiogram. Echocardiography is a painless test that uses sound waves to create images of your heart. It provides your doctor with information about the size and shape of your heart and how well your heart's chambers and valves are working. This procedure takes approximately one hour. There are no restrictions for this procedure.     Follow-Up: At [facility], you and your health needs are our priority.  As part of our continuing mission to provide you with exceptional heart care, we have created designated Provider Care Teams.  These Care Teams include your primary Cardiologist (physician) and Advanced Practice Providers (APPs -  Physician Assistants and Nurse Practitioners) who all work together to provide you with the care you need, when you need it.  We recommend signing up for the patient portal called "MyChart".  Sign up information is provided on this After Visit Summary.  MyChart is used to connect with patients for Virtual Visits (Telemedicine).  Patients are able to view lab/test results, encounter notes, upcoming appointments, etc.  Non-urgent messages can be sent to your provider as well.   To learn more about what you can do with MyChart, go to [email or web address].    Your next appointment:   12  month(s)  The format for your next appointment:   In Person  Provider:    You may see Dr. Fletcher Anon or one of the following Advanced Practice Providers on your designated Care Team:    Murray Hodgkins, NP  Christell Faith, PA-C  Marrianne Mood, PA-C    Other Instructions  Echocardiogram An echocardiogram is a procedure that uses painless sound waves (ultrasound) to produce an image of the heart. Images from an echocardiogram can provide important information about:  Signs of coronary artery disease (CAD).  Aneurysm detection. An aneurysm is a weak or damaged part of an artery wall that bulges out from the normal force of blood pumping through the body.  Heart size and shape. Changes in the size or shape of the heart can be associated with certain conditions, including heart failure, aneurysm, and CAD.  Heart muscle function.  Heart valve function.  Signs of a past heart attack.  Fluid buildup around the heart.  Thickening of the heart muscle.  A tumor or infectious growth around the heart valves. Tell a health care provider about:  Any allergies you have.  All medicines you are taking, including vitamins, herbs, eye drops, creams, and over-the-counter medicines.  Any blood disorders you have.  Any surgeries you have had.  Any medical conditions you have.  Whether you are pregnant or may be pregnant. What are the risks? Generally, this is a safe procedure. However, problems may occur, including:  Allergic reaction to dye (contrast) that may be used during the procedure. What happens before the procedure? No specific  preparation is needed. You may eat and drink normally. What happens during the procedure?   An IV tube may be inserted into one of your veins.  You may receive contrast through this tube. A contrast is an injection that improves the quality of the pictures from your heart.  A gel will be applied to your chest.  A wand-like tool (transducer) will  be moved over your chest. The gel will help to transmit the sound waves from the transducer.  The sound waves will harmlessly bounce off of your heart to allow the heart images to be captured in real-time motion. The images will be recorded on a computer. The procedure may vary among health care providers and hospitals. What happens after the procedure?  You may return to your normal, everyday life, including diet, activities, and medicines, unless your health care provider tells you not to do that. Summary  An echocardiogram is a procedure that uses painless sound waves (ultrasound) to produce an image of the heart.  Images from an echocardiogram can provide important information about the size and shape of your heart, heart muscle function, heart valve function, and fluid buildup around your heart.  You do not need to do anything to prepare before this procedure. You may eat and drink normally.  After the echocardiogram is completed, you may return to your normal, everyday life, unless your health care provider tells you not to do that. This information is not intended to replace advice given to you by your health care provider. Make sure you discuss any questions you have with your health care provider. Document Revised: 12/07/2018 Document Reviewed: 09/18/2016 Elsevier Patient Education  [place].

## 2019-12-20 NOTE — Progress Notes (Signed)
Cardiology Office Note   Date:  12/20/2019   ID:  Helen Parks, DOB 1951-01-12, MRN [id number]  PCP:  Idelle Crouch, MD  Cardiologist:   Kathlyn Sacramento, MD   Chief Complaint  Patient presents with  . OTHER    12 month f/u no complaints today. Meds reviewed verbally with pt.      History of Present Illness: Helen Parks is a 69 y.o. 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 She had normal coronary arteries by catheterization in 2010, history of rheumatoid arthritis, breast cancer, status post mastectomy and chemotherapy in 1997 with no radiation therapy with ejection fraction noted to be 30% in 2005.  She had an episode of heart failure in 2010 which was likely triggered by the use of Enbrel for rheumatoid arthritis.  Her initial ejection fraction was 25-30% which gradually improved to 40-45% in June 2016.  Most recent echocardiogram in July 2019 showed an EF of 40 to 45% with mild to moderate mitral regurgitation.  She had some recent episodes of substernal chest tightness at night when she is trying to sleep.  She has been under increased stress after 2 deaths in her family.  She also had recent worsening of leg edema that required increasing furosemide to 3 tablets daily for few days.  She is back to 1 tablet twice daily of 20 mg.  She is in the process of starting a new medication for rheumatoid arthritis called Orencia.  We checked this medication and does not have any cardiac side effects or interaction with her medications.   Past Medical History:  Diagnosis Date  . Breast cancer ([place]) 1997   s/p mastectomy, right breast, chemo, BRCA negative  . Cardiomyopathy   . 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 Chronic systolic congestive heart failure ([place])   . Collagen vascular disease ([facility])    RA  . Diverticulosis of colon (without mention  of hemorrhage) April 2012   occurred after colonoscopy, [place]  . Menopause    secondary to chemo for BRCA  . Rheumatoid arthritis ([place])   . Rheumatoid arthritis(714.0)    on Plaquenil methotrxate Precious Reel)    Past Surgical History:  Procedure Laterality Date  . ANKLE FUSION     right, seondary to erosive arthritis  . BREAST BIOPSY Right 1997   positive for breast ca  . BREAST SURGERY Right 1997   Lumpectomy  . CARDIAC CATHETERIZATION  2010   normal coronary arteries  . COLONOSCOPY WITH PROPOFOL N/A 03/26/2016   Procedure: COLONOSCOPY WITH PROPOFOL;  Surgeon: Lollie Sails, MD;  [facility] ENDOSCOPY;  Service: Endoscopy;  Laterality: N/A;  . FOOT SURGERY  2004  . MASTECTOMY Right 1997  . rheumatology histroy       Current Outpatient Medications  Medication Sig Dispense Refill  . Calcium 150 MG TABS Take by mouth daily.      . carvedilol (COREG) 6.25 MG tablet TAKE ONE TABLET BY MOUTH 2 TIMES A DAY 180 tablet 3  . cholecalciferol (VITAMIN D) 1000 UNITS tablet Take 2,000 Units by mouth daily.     . folic acid (FOLVITE) 1 MG tablet Take 1 mg by mouth daily.      . furosemide (LASIX) 20 MG tablet TAKE ONE TABLET BY MOUTH 2 TIMES A DAY 180 tablet 3  . hydroxychloroquine (PLAQUENIL) 200 MG tablet  Take 200 mg by mouth 2 (two) times daily.     Marland Kitchen lisinopril (ZESTRIL) 10 MG tablet Take 1 tablet (10 mg total) by mouth daily. 90 tablet 3  . methotrexate 50 MG/2ML injection INJECT 1ML SUBCUTANEOUSLY ONCE A WEEK; takes 25 mg/mL    . spironolactone (ALDACTONE) 25 MG tablet TAKE 1 TABLET BY MOUTH EVERY DAY 90 tablet 3   No current facility-administered medications for this visit.    Allergies:   Amoxicillin, Sulfasalazine, and Latex    Social History:  The patient  reports that she has never smoked. She has never used smokeless tobacco. She reports current alcohol use. She reports that she does not use drugs.   Family History:  The patient's family history includes  Cancer in her mother and paternal aunt; Heart disease in her father and maternal grandmother.    ROS:  Please see the history of present illness.   Otherwise, review of systems are positive for none.   All other systems are reviewed and negative.    PHYSICAL EXAM: VS:  BP 108/70 (BP Location: Left Arm, Patient Position: Sitting, Cuff Size: Normal)   Pulse 74   Ht 5' 2" (1.575 m)   Wt 147 lb 2 oz (66.7 kg)   SpO2 98%   BMI 26.91 kg/m  , BMI Body mass index is 26.91 kg/m. GEN: Well nourished, well developed, in no acute distress  HEENT: normal  Neck: no JVD, carotid bruits, or masses Cardiac: RRR; no murmurs, rubs, or gallops,no edema  Respiratory:  clear to auscultation bilaterally, normal work of breathing GI: soft, nontender, nondistended, + BS MS: no deformity or atrophy  Skin: warm and dry, no rash Neuro:  Strength and sensation are intact Psych: euthymic mood, full affect   EKG:  EKG is ordered today. The ekg ordered today demonstrates normal sinus rhythm with low voltage.   Recent Labs: No results found for requested labs within last 8760 hours.    Lipid Panel    Component Value Date/Time   CHOL 165 11/02/2016 1520   TRIG 110.0 11/02/2016 1520   TRIG 82 02/04/2010 0000   HDL 55.80 11/02/2016 1520   CHOLHDL 3 11/02/2016 1520   VLDL 22.0 11/02/2016 1520   LDLCALC 87 11/02/2016 1520   LDLDIRECT 87.0 11/02/2016 1520      Wt Readings from Last 3 Encounters:  12/20/19 147 lb 2 oz (66.7 kg)  03/29/19 142 lb 12 oz (64.8 kg)  08/15/18 141 lb 6.4 oz (64.1 kg)        ASSESSMENT AND PLAN:  1.  Chronic systolic heart failure: Due to chemotherapy-induced cardiomyopathy. Most recent ejection fraction was 40-45%.  She had recent episodes of chest pain at night recently and some volume overloaded but required increasing furosemide for few days.  Due to that, I am going to obtain an echocardiogram to ensure stability of ejection fraction.  If EF is below 40%, we should  definitely consider switching lisinopril to [facility] with caution given history of intermittent hypotension. I reviewed her labs done at [facility] which were unremarkable.  2. Rheumatoid arthritis: In the process of starting infusion with Orencia which does not have cardiac side effects or interactions with cardiac medications.  3.  mitral regurgitation: This was mild to moderate on most recent echocardiogram from last years with no murmurs by exam.  Check echocardiogram   Disposition:   FU with me in 1 year  Signed,  Kathlyn Sacramento, MD  12/20/2019 8:37 AM    [facility]

## 2019-12-25 ENCOUNTER — Ambulatory Visit: Payer: Medicare Other | Admitting: Occupational Therapy

## 2019-12-25 ENCOUNTER — Other Ambulatory Visit: Payer: Self-pay

## 2019-12-25 DIAGNOSIS — M6281 Muscle weakness (generalized): Secondary | ICD-10-CM

## 2019-12-25 DIAGNOSIS — I972 Postmastectomy lymphedema syndrome: Secondary | ICD-10-CM

## 2019-12-25 DIAGNOSIS — I890 Lymphedema, not elsewhere classified: Secondary | ICD-10-CM

## 2019-12-25 NOTE — Patient Instructions (Signed)
See note

## 2019-12-25 NOTE — Therapy (Signed)
[facility] [address] [place], [address] Phone: [phone]   Fax:  [phone]  Occupational Therapy Treatment  Patient Details  Name: Helen Parks MRN: [id number] Date of Birth: [birth year] Referring Provider (OT): [place], [place]   Encounter Date: 12/25/2019  OT End of Session - 12/25/19 1420    Visit Number  4    Number of Visits  24    Date for OT Re-Evaluation  01/24/20    OT Start Time  1345    OT Stop Time  1414    OT Time Calculation (min)  29 min    Activity Tolerance  Patient tolerated treatment well    Behavior During Therapy  [facility] for tasks assessed/performed       Past Medical History:  Diagnosis Date  . Breast cancer ([place]) 1997   s/p mastectomy, right breast, chemo, BRCA negative  . Cardiomyopathy   . 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 Chronic systolic congestive heart failure ([place])   . Collagen vascular disease (HCC)    RA  . Diverticulosis of colon (without mention of hemorrhage) April 2012   occurred after colonoscopy, [place]  . Menopause    secondary to chemo for BRCA  . Rheumatoid arthritis ([place])   . Rheumatoid arthritis(714.0)    on Plaquenil methotrxate Precious Reel)    Past Surgical History:  Procedure Laterality Date  . ANKLE FUSION     right, seondary to erosive arthritis  . BREAST BIOPSY Right 1997   positive for breast ca  . BREAST SURGERY Right 1997   Lumpectomy  . CARDIAC CATHETERIZATION  2010   normal coronary arteries  . COLONOSCOPY WITH PROPOFOL N/A 03/26/2016   Procedure: COLONOSCOPY WITH PROPOFOL;  Surgeon: Lollie Sails, MD;  Location: [facility] END[facility]COPY;  Service: Endoscopy;  Laterality: N/A;  . FOOT SURGERY  2004  . MASTECTOMY Right 1997  . rheumatology histroy      There were no vitals filed for this visit.  Subjective Assessment - 12/25/19 1418    Subjective   I done like you told me and wore  some compression most all the day -and then add the bandage at night time - still feeling the forearm increased - did see my cardiologist and ask about my swellling in my arm - said could be the lymphatics probable had to work harder after my COVID shot - and that had some back up of lymph in that arm    Pertinent History  Patient with a history of right breast cancer s/p mastectomy and 18 lymph node removal.  She reports she was treated in the past for lymphedema years ago but felt her symptoms resolved and therefore got rid of her Reid sleeve and daytime compression sleeves a long time ago.Patient with recent increase in edema in right wrist, forearm to elbow.    Patient Stated Goals  Patient would like to manage the swelling in her right arm.    Currently in Pain?  No/denies          LYMPHEDEMA/ONCOLOGY QUESTIONNAIRE - 12/25/19 1346      Right Upper Extremity Lymphedema   15 cm Proximal to Olecranon Process  29.2 cm    10 cm Proximal to Olecranon Process  27.8 cm    Olecranon Process  24 cm    15 cm Proximal to Ulnar Styloid Process  24.4 cm    10  cm Proximal to Ulnar Styloid Process  21.8 cm    Just Proximal to Ulnar Styloid Process  17.3 cm    Across Hand at [employer]  18 cm    At [place] of 2nd Digit  6.4 cm    At [facility] of Thumb  6.4 cm       Pt measurements decrease again this date with isotoner glove ( that helped for her RA pain and flareup at the wrist) , tubigrip D for hand to elbow ,and E from forearm to upper arm  With short stretch bandage circular from hand to elbow   Pt to contact [facility] to get measured for over the counter Medi forte for daytime and glove  And then pump she can benefit from - because of RA - pain and stiffness -hard time with donning of compression garments  Did use in the past compression but not the last few years But they think with COVID shot maybe it triggered her lymphatic system   Will follow up with me end of next week to assess  compression sleeve and glove - to do compression at night time until she gets pump                OT Education - 12/25/19 1420    Education Details  progress and changes to HEP    Person(s) Educated  Patient    Methods  Explanation;Demonstration;Handout    Comprehension  Returned demonstration;Verbalized understanding          OT Long Term Goals - 11/28/19 1842      OT LONG TERM GOAL #1   Title  Patient will demonstrate independence in home program for MLD, use of compression and exercises.    Baseline  no current program    Time  8    Period  Weeks    Status  New    Target Date  01/24/20      OT LONG TERM GOAL #2   Title  Patient will demonstrate a decrease in circumferential measurements in right wrist, forearm and elbow by 2 cm to prepare for compression garment fitting.    Baseline  no current compression    Time  8    Period  Weeks    Status  New    Target Date  01/24/20      OT LONG TERM GOAL #3   Title  Patient to be independent with use of compression garments to manage symptoms of lymphedema.    Baseline  no garments at eval    Time  8    Period  Weeks    Status  New    Target Date  01/24/20            Plan - 12/25/19 1420    Clinical Impression Statement  Pt cont to decongest the R UE - with wrist and forearm increase about 2 cm now - report her wrist with RA actually feels better with the isotoner glove - pt to get measured with over the counter Medi forte sleeve and glove and will get her pump - because of her RA will be hard to donn compression garments , and did use in later 90's Reidsleeve and daytime compression garments- pt could benefit from pump in combination with daytime compression sleeve /glove to maintain her R UE lymphedema    OT Occupational Profile and History  Detailed Assessment- Review of Records and additional review of physical, cognitive, psychosocial history related to  current functional performance    Occupational  performance deficits (Please refer to evaluation for details):  IADL's;ADL's    Body Structure / Function / Physical Skills  ADL;Decreased knowledge of use of DME;Strength;Edema;UE functional use;IADL;ROM    Rehab Potential  Excellent    Clinical Decision Making  Limited treatment options, no task modification necessary    Comorbidities Affecting Occupational Performance:  May have comorbidities impacting occupational performance    Modification or Assistance to Complete Evaluation   No modification of tasks or assist necessary to complete eval    OT Frequency  1x / week    OT Duration  4 weeks    OT Treatment/Interventions  Self-care/ADL training;DME and/or AE instruction;Manual lymph drainage;Compression bandaging;Therapeutic activities;Therapeutic exercise;Manual Therapy;Patient/family education;Neuromuscular education    Plan  fit of over the counter sleeve adn glove - and progress with pump    Consulted and Agree with Plan of Care  Patient       Patient will benefit from skilled therapeutic intervention in order to improve the following deficits and impairments:   Body Structure / Function / Physical Skills: ADL, Decreased knowledge of use of DME, Strength, Edema, UE functional use, IADL, ROM       Visit Diagnosis: Muscle weakness (generalized)  Postmastectomy lymphedema syndrome  Lymphedema, not elsewhere classified    Problem List Patient Active Problem List   Diagnosis Date Noted  . Diverticulitis of large intestine without perforation or abscess without bleeding 05/19/2016  . Left sided abdominal pain 12/29/2015  . Fatigue 09/05/2015  . Visit for preventive health examination 02/27/2015  . Hiatal hernia 02/27/2015  . Unspecified vitamin D deficiency 02/23/2014  . Chronic systolic congestive heart failure ([place])   . Screening for cervical cancer 09/15/2011  . Screening for colon cancer 09/15/2011  . Diverticulosis of colon (without mention of hemorrhage)   . Menopause    . Rheumatoid arthritis(714.0)   . History of right breast cancer   . EDEMA 07/15/2010  . CARDIOMYOPATHY, PRIMARY, DILATED 11/14/2009    Rosalyn Gess OTR/l,CLT 12/25/2019, 2:24 PM  [place] [address] [place], [address] Phone: [phone]   Fax:  [phone]  Name: Helen Parks MRN: [id number] Date of Birth: [birth year]

## 2020-01-03 ENCOUNTER — Ambulatory Visit: Payer: Medicare Other | Attending: Rheumatology | Admitting: Occupational Therapy

## 2020-01-03 ENCOUNTER — Other Ambulatory Visit: Payer: Self-pay

## 2020-01-03 DIAGNOSIS — I972 Postmastectomy lymphedema syndrome: Secondary | ICD-10-CM | POA: Insufficient documentation

## 2020-01-03 DIAGNOSIS — I890 Lymphedema, not elsewhere classified: Secondary | ICD-10-CM

## 2020-01-03 DIAGNOSIS — M6281 Muscle weakness (generalized): Secondary | ICD-10-CM | POA: Insufficient documentation

## 2020-01-03 NOTE — Therapy (Signed)
[place] [facility] [address], [place], [address] Phone: [phone]   Fax:  [phone]  Occupational Therapy Treatment  Patient Details  Name: Helen Parks MRN: [id number] Date of Birth: [birth year] Referring Provider (OT): [place], [place]   Encounter Date: 01/03/2020  OT End of Session - 01/03/20 1439    Visit Number  5    Number of Visits  24    Date for OT Re-Evaluation  01/24/20    OT Start Time  1345    OT Stop Time  1425    OT Time Calculation (min)  40 min    Activity Tolerance  Patient tolerated treatment well    Behavior During Therapy  [facility] for tasks assessed/performed       Past Medical History:  Diagnosis Date  . Breast cancer ([place]) 1997   s/p mastectomy, right breast, chemo, BRCA negative  . Cardiomyopathy   . 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 Chronic systolic congestive heart failure ([place])   . Collagen vascular disease (HCC)    RA  . Diverticulosis of colon (without mention of hemorrhage) April 2012   occurred after colonoscopy, [place]  . Menopause    secondary to chemo for BRCA  . Rheumatoid arthritis ([place])   . Rheumatoid arthritis(714.0)    on Plaquenil methotrxate Precious Reel)    Past Surgical History:  Procedure Laterality Date  . ANKLE FUSION     right, seondary to erosive arthritis  . BREAST BIOPSY Right 1997   positive for breast ca  . BREAST SURGERY Right 1997   Lumpectomy  . CARDIAC CATHETERIZATION  2010   normal coronary arteries  . COLONOSCOPY WITH PROPOFOL N/A 03/26/2016   Procedure: COLONOSCOPY WITH PROPOFOL;  Surgeon: Lollie Sails, MD;  Location: [facility] ENDOSCOPY;  Service: Endoscopy;  Laterality: N/A;  . FOOT SURGERY  2004  . MASTECTOMY Right 1997  . rheumatology histroy      There were no vitals filed for this visit.  Subjective Assessment - 01/03/20 1435    Subjective   They did fit my sleeve Tuesday -  but my hand is going numb when I wear them for about 30 min - did not hear from my pump yet    Pertinent History  Patient with a history of right breast cancer s/p mastectomy and 18 lymph node removal.  She reports she was treated in the past for lymphedema years ago but felt her symptoms resolved and therefore got rid of her Reid sleeve and daytime compression sleeves a long time ago.Patient with recent increase in edema in right wrist, forearm to elbow.    Patient Stated Goals  Patient would like to manage the swelling in her right arm.    Currently in Pain?  No/denies          LYMPHEDEMA/ONCOLOGY QUESTIONNAIRE - 01/03/20 1356      Right Upper Extremity Lymphedema   15 cm Proximal to Olecranon Process  29.4 cm    10 cm Proximal to Olecranon Process  28 cm    Olecranon Process  23.5 cm    15 cm Proximal to Ulnar Styloid Process  24.5 cm    10 cm Proximal to Ulnar Styloid Process  21.4 cm    Just Proximal to Ulnar Styloid Process  17.4 cm    Across Hand at [employer]  19 cm    At [place] of  2nd Digit  6.4 cm    At [facility] of Thumb  6.8 cm      Pt arrive with her [employer] strong sleeve and glove - pt report her hand is going numb - assess sleeve and glove -appear glove fist well - did stretch fingers as recommended by the Rep But pt's wrist is really tight - and cut into her wrist -and causing tourniquet - causing numbness  Called Rep -will order the next size up - but pt to go ahead and wash sleeve and stretch wrist with can   Pt to cont with wearing her isotoner glove ( that helped for her RA pain and flareup at the wrist) , tubigrip D for hand to elbow ,and E from forearm to upper arm  - fitted with new ones And try and wearing Bella strong sleeve and glove it stretch - if causing numbness - do not wear and see Rep at [facility] early next week And do short stretch bandages at night time over tubigrip    measurements did increase hand and fingers - but on at forearm and elbow  decrease - see flowsheet    And then pump she can benefit from - because of RA - pain and stiffness -hard time with donning of compression garments  Did use in the past compression but not the last few years But they think with COVID shot maybe it triggered her lymphatic system   Will follow up with me next week to assess compression sleeve and glove - to do compression at night time until she gets pump              OT Education - 01/03/20 1439    Education Details  progress and changes to HEP    Person(s) Educated  Patient    Methods  Explanation;Demonstration;Handout    Comprehension  Returned demonstration;Verbalized understanding          OT Long Term Goals - 11/28/19 1842      OT LONG TERM GOAL #1   Title  Patient will demonstrate independence in home program for MLD, use of compression and exercises.    Baseline  no current program    Time  8    Period  Weeks    Status  New    Target Date  01/24/20      OT LONG TERM GOAL #2   Title  Patient will demonstrate a decrease in circumferential measurements in right wrist, forearm and elbow by 2 cm to prepare for compression garment fitting.    Baseline  no current compression    Time  8    Period  Weeks    Status  New    Target Date  01/24/20      OT LONG TERM GOAL #3   Title  Patient to be independent with use of compression garments to manage symptoms of lymphedema.    Baseline  no garments at eval    Time  8    Period  Weeks    Status  New    Target Date  01/24/20            Plan - 01/03/20 1440    Clinical Impression Statement  Pt arrive this date with her [employer] Strong sleeve and glove - but report she has issues since Tues her hand going numb- assess strong sleeve wrist causing tourniquet. Otherwise sleeve fits well and glove - called Rep - pt to wash and stretch wrist part  and try again - wil go ahead and order size 4 and will try on her early next week- pump awaiting authorization- will assess  compression again  next week    OT Occupational Profile and History  Detailed Assessment- Review of Records and additional review of physical, cognitive, psychosocial history related to current functional performance    Occupational performance deficits (Please refer to evaluation for details):  IADL's;ADL's    Body Structure / Function / Physical Skills  ADL;Decreased knowledge of use of DME;Strength;Edema;UE functional use;IADL;ROM    Rehab Potential  Excellent    Clinical Decision Making  Limited treatment options, no task modification necessary    Comorbidities Affecting Occupational Performance:  May have comorbidities impacting occupational performance    Modification or Assistance to Complete Evaluation   No modification of tasks or assist necessary to complete eval    OT Frequency  1x / week    OT Duration  4 weeks    OT Treatment/Interventions  Self-care/ADL training;DME and/or AE instruction;Manual lymph drainage;Compression bandaging;Therapeutic activities;Therapeutic exercise;Manual Therapy;Patient/family education;Neuromuscular education    Plan  Reasess fit of over the counter sleeve and glove - and progress with pump    Consulted and Agree with Plan of Care  Patient       Patient will benefit from skilled therapeutic intervention in order to improve the following deficits and impairments:   Body Structure / Function / Physical Skills: ADL, Decreased knowledge of use of DME, Strength, Edema, UE functional use, IADL, ROM       Visit Diagnosis: Postmastectomy lymphedema syndrome  Lymphedema, not elsewhere classified    Problem List Patient Active Problem List   Diagnosis Date Noted  . Diverticulitis of large intestine without perforation or abscess without bleeding 05/19/2016  . Left sided abdominal pain 12/29/2015  . Fatigue 09/05/2015  . Visit for preventive health examination 02/27/2015  . Hiatal hernia 02/27/2015  . Unspecified vitamin D deficiency 02/23/2014  .  Chronic systolic congestive heart failure ([place])   . Screening for cervical cancer 09/15/2011  . Screening for colon cancer 09/15/2011  . Diverticulosis of colon (without mention of hemorrhage)   . Menopause   . Rheumatoid arthritis(714.0)   . History of right breast cancer   . EDEMA 07/15/2010  . CARDIOMYOPATHY, PRIMARY, DILATED 11/14/2009    Rosalyn Gess OTR/L,CLT 01/03/2020, 4:07 PM  [place] [facility] [address] [place], [address] Phone: [phone]   Fax:  [phone]  Name: Helen Parks MRN: [id number] Date of Birth: August 22, 1951

## 2020-01-03 NOTE — Patient Instructions (Signed)
See note

## 2020-01-09 ENCOUNTER — Ambulatory Visit: Payer: Medicare Other | Admitting: Occupational Therapy

## 2020-01-09 ENCOUNTER — Other Ambulatory Visit: Payer: Self-pay

## 2020-01-09 DIAGNOSIS — I972 Postmastectomy lymphedema syndrome: Secondary | ICD-10-CM

## 2020-01-09 DIAGNOSIS — I890 Lymphedema, not elsewhere classified: Secondary | ICD-10-CM

## 2020-01-09 DIAGNOSIS — M6281 Muscle weakness (generalized): Secondary | ICD-10-CM

## 2020-01-09 NOTE — Therapy (Signed)
[facility] [address], [place], [address] Phone: [phone]   Fax:  [phone]  Occupational Therapy Treatment  Patient Details  Name: Helen Parks MRN: [id number] Date of Birth: Dec 27, 1950 Referring Provider (OT): [place], [place]   Encounter Date: 01/09/2020  OT End of Session - 01/09/20 1400    Visit Number  6    Number of Visits  24    Date for OT Re-Evaluation  01/24/20    OT Start Time  1330    OT Stop Time  1356    OT Time Calculation (min)  26 min    Activity Tolerance  Patient tolerated treatment well    Behavior During Therapy  [facility] for tasks assessed/performed       Past Medical History:  Diagnosis Date  . Breast cancer ([place]) 1997   s/p mastectomy, right breast, chemo, BRCA negative  . Cardiomyopathy   . 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 Chronic systolic congestive heart failure ([place])   . Collagen vascular disease (H[facility])    RA  . Diverticulosis of colon (without mention of hemorrhage) April 2012   occurred after colonoscopy, [place]  . Menopause    secondary to chemo for BRCA  . Rheumatoid arthritis ([place])   . Rheumatoid arthritis(714.0)    on Plaquenil methotrxate Precious Reel)    Past Surgical History:  Procedure Laterality Date  . ANKLE FUSION     right, seondary to erosive arthritis  . BREAST BIOPSY Right 1997   positive for breast ca  . BREAST SURGERY Right 1997   Lumpectomy  . CARDIAC CATHETERIZATION  2010   normal coronary arteries  . COLONOSCOPY WITH PROPOFOL N/A 03/26/2016   Procedure: COLONOSCOPY WITH PROPOFOL;  Surgeon: Lollie Sails, MD;  Location: [facility] ENDOSCOPY;  Service: Endoscopy;  Laterality: N/A;  . FOOT SURGERY  2004  . MASTECTOMY Right 1997  . rheumatology histroy      There were no vitals filed for this visit.  Subjective Assessment - 01/09/20 1358    Subjective   I did not hear anything from  [employer] about pump or replacement sleeve- I tried to stretch and wash the sleeve but did not work - to tight at the wrist - the glove is ok    Pertinent History  Patient with a history of right breast cancer s/p mastectomy and 18 lymph node removal.  She reports she was treated in the past for lymphedema years ago but felt her symptoms resolved and therefore got rid of her Reid sleeve and daytime compression sleeves a long time ago.Patient with recent increase in edema in right wrist, forearm to elbow.    Patient Stated Goals  Patient would like to manage the swelling in her right arm.    Currently in Pain?  No/denies          LYMPHEDEMA/ONCOLOGY QUESTIONNAIRE - 01/09/20 1341      Right Upper Extremity Lymphedema   15 cm Proximal to Olecranon Process  30 cm    10 cm Proximal to Olecranon Process  28.3 cm    Olecranon Process  24 cm    15 cm Proximal to Ulnar Styloid Process  24 cm    10 cm Proximal to Ulnar Styloid Process  21.8 cm    Just Proximal to Ulnar Styloid Process  17.4 cm    Across Hand at [employer]  18.5 cm  At [facility] of 2nd Digit  6.2 cm    At [facility] of Thumb  6.2 cm         Pt report that her [employer] strong sleeve was still to tight after washing it and stretching it with can  Did wear during the day the glove - appear glove fist well - did stretch fingers as recommended by the Rep Did not hear from [employer] about replacement sleeve and pump  Called Rep -pt to come by when leaving to fit with new compression sleeve -and wil check on status of pump    Pt to cont with wearing her isotoner glove ( that helped for her RA pain and flareup at the wrist) , tubigrip D for hand to elbow ,and E from forearm to upper arm  - fitted with new ones during day and night time add short stretch bandage from hand to upper arm over tubi grip  Pt going to be out of town next week - to contact me when back    measurements did decrease in hand and forearm, elbow - wrist and  forearm increase compare to other side -  see flowsheet    Can benefit from pump  - because of RA - pain and stiffness -hard time with donning of compression garments  Did use in the past compression but not the last few years But they think with COVID shot maybe it triggered her lymphatic system   Will follow up with me next week to assess compression sleeve and glove - to do compression at night time until she gets pump              OT Education - 01/09/20 1359    Education Details  compression sleeve wearing and homeprogram    Person(s) Educated  Patient    Methods  Explanation;Demonstration;Handout    Comprehension  Returned demonstration;Verbalized understanding          OT Long Term Goals - 11/28/19 1842      OT LONG TERM GOAL #1   Title  Patient will demonstrate independence in home program for MLD, use of compression and exercises.    Baseline  no current program    Time  8    Period  Weeks    Status  New    Target Date  01/24/20      OT LONG TERM GOAL #2   Title  Patient will demonstrate a decrease in circumferential measurements in right wrist, forearm and elbow by 2 cm to prepare for compression garment fitting.    Baseline  no current compression    Time  8    Period  Weeks    Status  New    Target Date  01/24/20      OT LONG TERM GOAL #3   Title  Patient to be independent with use of compression garments to manage symptoms of lymphedema.    Baseline  no garments at eval    Time  8    Period  Weeks    Status  New    Target Date  01/24/20            Plan - 01/09/20 1400    Clinical Impression Statement  Pt measurements compare to L UE - still increase at forearm and wrist- do have diagnosis of RA at the wrist -glove do fit well - but was unable to stretch the wrist of the Job[employer] Strong sleeve - still causing tourniquet- pt still  awaiting pump - OT called Rep and pt to stop by to get fitted with new compression sleeve and check on pump  - pt going to be out of town next week- to cont with daysleeve and if she do not have pump- use isotoner glove, tubigrip D and E with 6 cm short stretch bandage circular from hand to upper arm    OT Occupational Profile and History  Detailed Assessment- Review of Records and additional review of physical, cognitive, psychosocial history related to current functional performance    Occupational performance deficits (Please refer to evaluation for details):  IADL's;ADL's    Body Structure / Function / Physical Skills  ADL;Decreased knowledge of use of DME;Strength;Edema;UE functional use;IADL;ROM    Rehab Potential  Excellent    Clinical Decision Making  Limited treatment options, no task modification necessary    Comorbidities Affecting Occupational Performance:  May have comorbidities impacting occupational performance    Modification or Assistance to Complete Evaluation   No modification of tasks or assist necessary to complete eval    OT Frequency  1x / week    OT Duration  4 weeks    OT Treatment/Interventions  Self-care/ADL training;DME and/or AE instruction;Manual lymph drainage;Compression bandaging;Therapeutic activities;Therapeutic exercise;Manual Therapy;Patient/family education;Neuromuscular education    Plan  Reasess fit of over the counter sleeve and glove - and progress with pump    Consulted and Agree with Plan of Care  Patient       Patient will benefit from skilled therapeutic intervention in order to improve the following deficits and impairments:   Body Structure / Function / Physical Skills: ADL, Decreased knowledge of use of DME, Strength, Edema, UE functional use, IADL, ROM       Visit Diagnosis: Postmastectomy lymphedema syndrome  Lymphedema, not elsewhere classified  Muscle weakness (generalized)    Problem List Patient Active Problem List   Diagnosis Date Noted  . Diverticulitis of large intestine without perforation or abscess without bleeding 05/19/2016  .  Left sided abdominal pain 12/29/2015  . Fatigue 09/05/2015  . Visit for preventive health examination 02/27/2015  . Hiatal hernia 02/27/2015  . Unspecified vitamin D deficiency 02/23/2014  . Chronic systolic congestive heart failure ([place])   . Screening for cervical cancer 09/15/2011  . Screening for colon cancer 09/15/2011  . Diverticulosis of colon (without mention of hemorrhage)   . Menopause   . Rheumatoid arthritis(714.0)   . History of right breast cancer   . EDEMA 07/15/2010  . CARDIOMYOPATHY, PRIMARY, DILATED 11/14/2009    Rosalyn Gess  OTR/L,CLT 01/09/2020, 2:54 PM  [facility] [address] [place], [address] Phone: [phone]   Fax:  [phone]  Name: Helen Parks MRN: [id number] Date of Birth: 09-21-1950

## 2020-01-25 ENCOUNTER — Other Ambulatory Visit: Payer: Self-pay

## 2020-01-25 ENCOUNTER — Ambulatory Visit (INDEPENDENT_AMBULATORY_CARE_PROVIDER_SITE_OTHER): Payer: Medicare Other

## 2020-01-25 DIAGNOSIS — I5022 Chronic systolic (congestive) heart failure: Secondary | ICD-10-CM | POA: Diagnosis not present

## 2020-01-29 ENCOUNTER — Other Ambulatory Visit: Payer: Self-pay

## 2020-01-29 ENCOUNTER — Ambulatory Visit: Payer: Medicare Other | Attending: Rheumatology | Admitting: Occupational Therapy

## 2020-01-29 DIAGNOSIS — I890 Lymphedema, not elsewhere classified: Secondary | ICD-10-CM | POA: Insufficient documentation

## 2020-01-29 DIAGNOSIS — I972 Postmastectomy lymphedema syndrome: Secondary | ICD-10-CM | POA: Diagnosis present

## 2020-01-29 DIAGNOSIS — M6281 Muscle weakness (generalized): Secondary | ICD-10-CM

## 2020-01-29 NOTE — Therapy (Signed)
[facility] [address] [place], [address] Phone: [phone]   Fax:  [phone]  Occupational Therapy Evaluation  Patient Details  Name: Helen Parks MRN: [id number] Date of Birth: 21-Aug-1951 Referring Provider (OT): [place], [place]   Encounter Date: 01/29/2020  OT End of Session - 01/29/20 1340    Visit Number  7    Number of Visits  10    Date for OT Re-Evaluation  03/11/20    OT Start Time  1300    OT Stop Time  1330    OT Time Calculation (min)  30 min    Activity Tolerance  Patient tolerated treatment well    Behavior During Therapy  [facility] for tasks assessed/performed       Past Medical History:  Diagnosis Date  . Breast cancer ([place]) 1997   s/p mastectomy, right breast, chemo, BRCA negative  . Cardiomyopathy   . 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 Chronic systolic congestive heart failure ([place])   . Collagen vascular disease ([facility])    RA  . Diverticulosis of colon (without mention of hemorrhage) April 2012   occurred after colonoscopy, [place]  . Menopause    secondary to chemo for BRCA  . Rheumatoid arthritis ([place])   . Rheumatoid arthritis(714.0)    on Plaquenil methotrxate Precious Reel)    Past Surgical History:  Procedure Laterality Date  . ANKLE FUSION     right, seondary to erosive arthritis  . BREAST BIOPSY Right 1997   positive for breast ca  . BREAST SURGERY Right 1997   Lumpectomy  . CARDIAC CATHETERIZATION  2010   normal coronary arteries  . COLONOSCOPY WITH PROPOFOL N/A 03/26/2016   Procedure: COLONOSCOPY WITH PROPOFOL;  Surgeon: Lollie Sails, MD;  Location: [facility] ENDOSCOPY;  Service: Endoscopy;  Laterality: N/A;  . FOOT SURGERY  2004  . MASTECTOMY Right 1997  . rheumatology histroy      There were no vitals filed for this visit.  Subjective Assessment - 01/29/20 1337    Subjective   Did not get my pump yet - Doing  better with this sleeve- my hand do not go numb but my hand is swelling more -    Pertinent History  Patient with a history of right breast cancer s/p mastectomy and 18 lymph node removal.  She reports she was treated in the past for lymphedema years ago but felt her symptoms resolved and therefore got rid of her Reid sleeve and daytime compression sleeves a long time ago.Patient with recent increase in edema in right wrist, forearm to elbow.    Patient Stated Goals  Patient would like to manage the swelling in her right arm.    Currently in Pain?  No/denies          LYMPHEDEMA/ONCOLOGY QUESTIONNAIRE - 01/29/20 1310      Right Upper Extremity Lymphedema   15 cm Proximal to Olecranon Process  28.2 cm    10 cm Proximal to Olecranon Process  27.5 cm    Olecranon Process  23.8 cm    15 cm Proximal to Ulnar Styloid Process  24 cm    10 cm Proximal to Ulnar Styloid Process  21 cm    Just Proximal to Ulnar Styloid Process  17.2 cm    Across Hand at [employer]  19 cm    At [place] of 2nd Digit  6.3  cm    At [facility] of Thumb  6.3 cm      Left Upper Extremity Lymphedema   15 cm Proximal to Olecranon Process  29.5 cm    10 cm Proximal to Olecranon Process  28 cm    Olecranon Process  24.5 cm    15 cm Proximal to Ulnar Styloid Process  22 cm    10 cm Proximal to Ulnar Styloid Process  19 cm    Just Proximal to Ulnar Styloid Process  15.5 cm    Across Hand at [employer]  17.5 cm        Pt report that her [employer] strong sleeve  - she picked up prior to he trip to [facility]- the nr 4 size but regular length -and working well - but hand ones to swell  No numbness  Did not hear from [employer] about her pump - did see her MD and he did send in the paper work to them - can pick up today  OT called Rep -report that pt could come by and get pump - will fit pt and ed on using    Pt to cont with wearing herisotoner glove ( that helped for her RA pain and flareup at the wrist) , tubigrip D for hand  to elbow ,and E from forearm to upper arm- fitted with new ones night time  -but can stop short stretch bandage from hand to upper arm over tubi grip    measurements did decrease in R UE except hand -  see flowsheet    Pt to use pump in am and pm for 45 min to hour - Will follow up with me in 2 wks assess use of pump and  compression sleeve and glove - and maintaining her lymphedema in R UE                OT Education - 01/29/20 1340    Education Details  compression sleeve wearing and homeprogram with pump use    Person(s) Educated  Patient    Methods  Explanation;Demonstration;Handout    Comprehension  Returned demonstration;Verbalized understanding          OT Long Term Goals - 01/29/20 1341      OT LONG TERM GOAL #1   Title  Patient will demonstrate independence in home program for MLD, use of compression and exercises.    Status  Achieved      OT LONG TERM GOAL #2   Title  Patient will demonstrate a decrease in circumferential measurements in right wrist, forearm and elbow by 2 cm to prepare for compression garment fitting.    Status  Achieved      OT LONG TERM GOAL #3   Title  Patient to be independent with use of compression garments to manage symptoms of lymphedema.    Baseline  was fitted about week ago -but need to assess in combination with pump use - if it contain her R UE    Time  6    Period  Weeks    Status  On-going            Plan - 01/29/20 1740    Clinical Impression Statement  Pt measurements in R UE decrease with use of her new compression daysleeve and glove - but hand did increase - pt ed on donning and wearing of sleeve -and then Called Rep and he said for her to pick up her pump - pt to  use pump for 2 wks am and pm for 45 min to hour - and daytime compession sleeve and glove - can cont with tubigrip night time but no bandaging -will follow up in 2 wks    OT Occupational Profile and History  Detailed Assessment- Review of  Records and additional review of physical, cognitive, psychosocial history related to current functional performance    Occupational performance deficits (Please refer to evaluation for details):  IADL's;ADL's    Body Structure / Function / Physical Skills  ADL;Decreased knowledge of use of DME;Strength;Edema;UE functional use;IADL;ROM    Rehab Potential  Excellent    Clinical Decision Making  Limited treatment options, no task modification necessary    Comorbidities Affecting Occupational Performance:  May have comorbidities impacting occupational performance    Modification or Assistance to Complete Evaluation   No modification of tasks or assist necessary to complete eval    OT Frequency  Biweekly    OT Duration  6 weeks    OT Treatment/Interventions  Self-care/ADL training;DME and/or AE instruction;Manual lymph drainage;Compression bandaging;Therapeutic activities;Therapeutic exercise;Manual Therapy;Patient/family education;Neuromuscular education    Plan  Reasess fit of over the counter sleeve and glove - and progress with pump    Consulted and Agree with Plan of Care  Patient       Patient will benefit from skilled therapeutic intervention in order to improve the following deficits and impairments:   Body Structure / Function / Physical Skills: ADL, Decreased knowledge of use of DME, Strength, Edema, UE functional use, IADL, ROM       Visit Diagnosis: Postmastectomy lymphedema syndrome - Plan: Ot plan of care cert/re-cert  Lymphedema, not elsewhere classified - Plan: Ot plan of care cert/re-cert  Muscle weakness (generalized) - Plan: Ot plan of care cert/re-cert    Problem List Patient Active Problem List   Diagnosis Date Noted  . Diverticulitis of large intestine without perforation or abscess without bleeding 05/19/2016  . Left sided abdominal pain 12/29/2015  . Fatigue 09/05/2015  . Visit for preventive health examination 02/27/2015  . Hiatal hernia 02/27/2015  .  Unspecified vitamin D deficiency 02/23/2014  . Chronic systolic congestive heart failure ([place])   . Screening for cervical cancer 09/15/2011  . Screening for colon cancer 09/15/2011  . Diverticulosis of colon (without mention of hemorrhage)   . Menopause   . Rheumatoid arthritis(714.0)   . History of right breast cancer   . EDEMA 07/15/2010  . CARDIOMYOPATHY, PRIMARY, DILATED 11/14/2009    Rosalyn Gess OTR/l,CLT 01/29/2020, 5:44 PM  [facility] [address], [place], [address] Phone: [phone]   Fax:  [phone]  Name: Helen Parks MRN: [id number] Date of Birth: 30-Mar-1951

## 2020-01-29 NOTE — Patient Instructions (Signed)
See note

## 2020-02-12 ENCOUNTER — Other Ambulatory Visit: Payer: Self-pay

## 2020-02-12 ENCOUNTER — Ambulatory Visit: Payer: Medicare Other | Admitting: Occupational Therapy

## 2020-02-12 DIAGNOSIS — M6281 Muscle weakness (generalized): Secondary | ICD-10-CM

## 2020-02-12 DIAGNOSIS — I890 Lymphedema, not elsewhere classified: Secondary | ICD-10-CM

## 2020-02-12 DIAGNOSIS — I972 Postmastectomy lymphedema syndrome: Secondary | ICD-10-CM

## 2020-02-12 NOTE — Therapy (Signed)
[place] [facility] [address] [place], [address] Phone: [phone]   Fax:  [phone]  Occupational Therapy Treatment  Patient Details  Name: Helen Parks MRN: [id number] Date of Birth: [birth year] Referring Provider (OT): [place], [place]   Encounter Date: 02/12/2020   OT End of Session - 02/12/20 1709    Visit Number 8    Number of Visits 10    Date for OT Re-Evaluation 03/11/20    OT Start Time 1301    OT Stop Time 1325    OT Time Calculation (min) 24 min    Activity Tolerance Patient tolerated treatment well    Behavior During Therapy [facility] for tasks assessed/performed           Past Medical History:  Diagnosis Date  . Breast cancer ([place]) 1997   s/p mastectomy, right breast, chemo, BRCA negative  . Cardiomyopathy   . 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 Chronic systolic congestive heart failure ([place])   . Collagen vascular disease (H[facility])    RA  . Diverticulosis of colon (without mention of hemorrhage) April 2012   occurred after colonoscopy, [place]  . Menopause    secondary to chemo for BRCA  . Rheumatoid arthritis ([place])   . Rheumatoid arthritis(714.0)    on Plaquenil methotrxate Precious Reel)    Past Surgical History:  Procedure Laterality Date  . ANKLE FUSION     right, seondary to erosive arthritis  . BREAST BIOPSY Right 1997   positive for breast ca  . BREAST SURGERY Right 1997   Lumpectomy  . CARDIAC CATHETERIZATION  2010   normal coronary arteries  . COLONOSCOPY WITH PROPOFOL N/A 03/26/2016   Procedure: COLONOSCOPY WITH PROPOFOL;  Surgeon: Lollie Sails, MD;  Location: [facility] ENDOSCOPY;  Service: Endoscopy;  Laterality: N/A;  . FOOT SURGERY  2004  . MASTECTOMY Right 1997  . rheumatology histroy      There were no vitals filed for this visit.   Subjective Assessment - 02/12/20 1708    Subjective  I used the pump since 2 wks ago  -and my hand looks great after pump , and but the sleeve still tight at wrist , or glove - 2 fingers go still numb and hand do swell some during  day with sleeve - I did not wear my daysleeve for about 3 [place] at the beach    Pertinent History Patient with a history of right breast cancer s/p mastectomy and 18 lymph node removal.  She reports she was treated in the past for lymphedema years ago but felt her symptoms resolved and therefore got rid of her Reid sleeve and daytime compression sleeves a long time ago.Patient with recent increase in edema in right wrist, forearm to elbow.    Patient Stated Goals Patient would like to manage the swelling in her right arm.    Currently in Pain? No/denies               LYMPHEDEMA/ONCOLOGY QUESTIONNAIRE - 02/12/20 0001      Right Upper Extremity Lymphedema   15 cm Proximal to Olecranon Process 29 cm    10 cm Proximal to Olecranon Process 27.5 cm    Olecranon Process 23.5 cm    15 cm Proximal to Ulnar Styloid Process 24 cm    10 cm Proximal to Ulnar Styloid Process 21 cm    Just Proximal to Ulnar Styloid  Process 16.8 cm    Across Hand at [employer] 19.2 cm    At [place] of 2nd Digit 6.6 cm    At [facility] of Thumb 6.6 cm             Ptreport that herJobst Bella strong sleeve - she picked up prior to he trip to [facility]- the nr 4 size but regular length -and working well - but hand ones to swell still tight at wrist  But feels like her glove to tight - numbness in 2 fingers at times    Did use pump 2 x day 45 min - am and pm - no issues - did not wear daytime compression sleeve at beach for 3 days    Did wear herisotoner glove ( that helped for her RA pain and flareup at the wrist) , tubigrip D for hand to elbow ,and E from forearm to upper arm--night time   Measurements same and wrist decrease but hand still increase - pt to wash and stretch wrist of sleeve few times- recommend by Reps in past  And will reassess in week - can see if she  wear glove separate if 2 fingers goes numb or if it is the sleeve     Pt to use pump in am and pm for 45 min to hour - Will follow up with me in 1 wks assess use of pump and  compression sleeve and glove - and maintaining her lymphedema in R UE  If need to return to [employer]eps for better fitting compression                     OT Long Term Goals - 01/29/20 1341      OT LONG TERM GOAL #1   Title Patient will demonstrate independence in home program for MLD, use of compression and exercises.    Status Achieved      OT LONG TERM GOAL #2   Title Patient will demonstrate a decrease in circumferential measurements in right wrist, forearm and elbow by 2 cm to prepare for compression garment fitting.    Status Achieved      OT LONG TERM GOAL #3   Title Patient to be independent with use of compression garments to manage symptoms of lymphedema.    Baseline was fitted about week ago -but need to assess in combination with pump use - if it contain her R UE    Time 6    Period Weeks    Status On-going                 Plan - 02/12/20 1710    Clinical Impression Statement Using the pump 2 x day 45 min and using Bella strong sleeve and glove is maintaining her circumference -but hand is still increase and pt report 2 fingers still going numb - pump decrease hand swelling but do swell with sleeve on - pt to wash and stretch wrist cuff of sleeve - and see if helps with hand swelling -otherwise will contact Rep again    OT Occupational Profile and History Detailed Assessment- Review of Records and additional review of physical, cognitive, psychosocial history related to current functional performance    Occupational performance deficits (Please refer to evaluation for details): IADL's;ADL's    Body Structure / Function / Physical Skills ADL;Decreased knowledge of use of DME;Strength;Edema;UE functional use;IADL;ROM    Rehab Potential Excellent    Clinical Decision Making Limited  treatment options,  no task modification necessary    Comorbidities Affecting Occupational Performance: May have comorbidities impacting occupational performance    Modification or Assistance to Complete Evaluation  No modification of tasks or assist necessary to complete eval    OT Frequency 1x / week    OT Duration 2 weeks    OT Treatment/Interventions Self-care/ADL training;DME and/or AE instruction;Manual lymph drainage;Compression bandaging;Therapeutic activities;Therapeutic exercise;Manual Therapy;Patient/family education;Neuromuscular education    Plan Reasess fit of over the counter sleeve and glove - and progress with pump    Consulted and Agree with Plan of Care Patient           Patient will benefit from skilled therapeutic intervention in order to improve the following deficits and impairments:   Body Structure / Function / Physical Skills: ADL, Decreased knowledge of use of DME, Strength, Edema, UE functional use, IADL, ROM       Visit Diagnosis: Lymphedema, not elsewhere classified  Muscle weakness (generalized)  Postmastectomy lymphedema syndrome    Problem List Patient Active Problem List   Diagnosis Date Noted  . Diverticulitis of large intestine without perforation or abscess without bleeding 05/19/2016  . Left sided abdominal pain 12/29/2015  . Fatigue 09/05/2015  . Visit for preventive health examination 02/27/2015  . Hiatal hernia 02/27/2015  . Unspecified vitamin D deficiency 02/23/2014  . Chronic systolic congestive heart failure ([place])   . Screening for cervical cancer 09/15/2011  . Screening for colon cancer 09/15/2011  . Diverticulosis of colon (without mention of hemorrhage)   . Menopause   . Rheumatoid arthritis(714.0)   . History of right breast cancer   . EDEMA 07/15/2010  . CARDIOMYOPATHY, PRIMARY, DILATED 11/14/2009    Rosalyn Gess OTR/l,CLT 02/12/2020, 5:12 PM  [place] [facility] [address] [place], [address] Phone: [phone]   Fax:  [phone]  Name: BONI MACLELLAN MRN: [id number] Date of Birth: [birth year]

## 2020-02-19 ENCOUNTER — Other Ambulatory Visit: Payer: Self-pay

## 2020-02-19 ENCOUNTER — Ambulatory Visit: Payer: Medicare Other | Admitting: Occupational Therapy

## 2020-02-19 DIAGNOSIS — M6281 Muscle weakness (generalized): Secondary | ICD-10-CM

## 2020-02-19 DIAGNOSIS — I890 Lymphedema, not elsewhere classified: Secondary | ICD-10-CM

## 2020-02-19 DIAGNOSIS — I972 Postmastectomy lymphedema syndrome: Secondary | ICD-10-CM | POA: Diagnosis not present

## 2020-02-19 NOTE — Therapy (Signed)
[place] [facility] [address] [place], [address] Phone: [phone]   Fax:  [phone]  Occupational Therapy Treatment  Patient Details  Name: Helen Parks MRN: [id number] Date of Birth: [birth year] Referring Provider (OT): [place], [place]   Encounter Date: 02/19/2020   OT End of Session - 02/19/20 1358    Visit Number 9    Number of Visits 10    Date for OT Re-Evaluation 03/11/20    OT Start Time 1330    OT Stop Time 1350    OT Time Calculation (min) 20 min    Activity Tolerance Patient tolerated treatment well    Behavior During Therapy [facility] for tasks assessed/performed           Past Medical History:  Diagnosis Date  . Breast cancer ([place]) 1997   s/p mastectomy, right breast, chemo, BRCA negative  . Cardiomyopathy   . 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 Chronic systolic congestive heart failure ([place])   . Collagen vascular disease (HCC)    RA  . Diverticulosis of colon (without mention of hemorrhage) April 2012   occurred after colonoscopy, [place]  . Menopause    secondary to chemo for BRCA  . Rheumatoid arthritis ([place])   . Rheumatoid arthritis(714.0)    on Plaquenil methotrxate Precious Reel)    Past Surgical History:  Procedure Laterality Date  . ANKLE FUSION     right, seondary to erosive arthritis  . BREAST BIOPSY Right 1997   positive for breast ca  . BREAST SURGERY Right 1997   Lumpectomy  . CARDIAC CATHETERIZATION  2010   normal coronary arteries  . COLONOSCOPY WITH PROPOFOL N/A 03/26/2016   Procedure: COLONOSCOPY WITH PROPOFOL;  Surgeon: Lollie Sails, MD;  Location: [facility] ENDOSCOPY;  Service: Endoscopy;  Laterality: N/A;  . FOOT SURGERY  2004  . MASTECTOMY Right 1997  . rheumatology histroy      There were no vitals filed for this visit.   Subjective Assessment - 02/19/20 1357    Subjective  I stretch with wrist part of my  sleeve - feels like it is not to tight and fingers of the glove- but if I put the 2 together and not  moving a lot the day -my fingers goes numb - pump doing okay    Pertinent History Patient with a history of right breast cancer s/p mastectomy and 18 lymph node removal.  She reports she was treated in the past for lymphedema years ago but felt her symptoms resolved and therefore got rid of her Reid sleeve and daytime compression sleeves a long time ago.Patient with recent increase in edema in right wrist, forearm to elbow.    Patient Stated Goals Patient would like to manage the swelling in her right arm.    Currently in Pain? No/denies               LYMPHEDEMA/ONCOLOGY QUESTIONNAIRE - 02/19/20 0001      Right Upper Extremity Lymphedema   15 cm Proximal to Olecranon Process 28.5 cm    10 cm Proximal to Olecranon Process 7.5 cm    Olecranon Process 23.5 cm    15 cm Proximal to Ulnar Styloid Process 23.8 cm    10 cm Proximal to Ulnar Styloid Process 21 cm    Just Proximal to Ulnar Styloid Process 16.4 cm    Across Hand at [employer] 18.6  cm    At [facility] of 2nd Digit 6.4 cm    At [facility] of Thumb 6.4 cm             Ptreport that herJobst Bella strong sleeveshe washed and stretch the wrist cuff and digits of glove- she feels like it is little looser but if she puts glove with compression sleeve on -then fingers still going numb.  Upon assessment - ed pt on donning sleeve - making sure she moves fabric evenly over the whole arm - not more fabric from wrist to elbow and stretch more on upper arm  Causing tourniquet at wrist - spread fabric out  Cont to use  pump 2 x day 45 min - am and pm - no issues   Cont isotoner glove ( that helped for her RA pain and flareup at the wrist) , tubigrip D for hand to elbow ,and E from forearm to upper arm--night time   Measurements decrease in hand , wrist compare to last time - and others circumference staying steady. Pt to wash and  stretch wrist of sleeve few times as well as wrist part of glove. recommend by Reps in past  And will reassess in 2 weeks    Pt to use pump in am and pm for 45 min to hour -               OT Education - 02/19/20 1357    Education Details compression sleeve wearing and homeprogram with pump use    Person(s) Educated Patient    Methods Explanation;Demonstration    Comprehension Returned demonstration;Verbalized understanding               OT Long Term Goals - 01/29/20 1341      OT LONG TERM GOAL #1   Title Patient will demonstrate independence in home program for MLD, use of compression and exercises.    Status Achieved      OT LONG TERM GOAL #2   Title Patient will demonstrate a decrease in circumferential measurements in right wrist, forearm and elbow by 2 cm to prepare for compression garment fitting.    Status Achieved      OT LONG TERM GOAL #3   Title Patient to be independent with use of compression garments to manage symptoms of lymphedema.    Baseline was fitted about week ago -but need to assess in combination with pump use - if it contain her R UE    Time 6    Period Weeks    Status On-going                 Plan - 02/19/20 1358    Clinical Impression Statement Pt's circumference in hand and wrist decreased  and maintaining forearmand upper arm - she did stretch the cuff of compression sleeve some and digits of glove - but the wrist part of glove still tight and causing double compression at wrist - causing some numbness- she do need to [employer] strong to contain her circumference - and that size - pt to stretch wrist part of both garments and reassess in 2 wks    OT Occupational Profile and History Detailed Assessment- Review of Records and additional review of physical, cognitive, psychosocial history related to current functional performance    Occupational performance deficits (Please refer to evaluation for details): IADL's;ADL's    Body  Structure / Function / Physical Skills ADL;Decreased knowledge of use of DME;Strength;Edema;UE functional use;IADL;ROM  Rehab Potential Excellent    Comorbidities Affecting Occupational Performance: May have comorbidities impacting occupational performance    Modification or Assistance to Complete Evaluation  No modification of tasks or assist necessary to complete eval    OT Frequency Biweekly    OT Duration 2 weeks    OT Treatment/Interventions Self-care/ADL training;DME and/or AE instruction;Manual lymph drainage;Compression bandaging;Therapeutic activities;Therapeutic exercise;Manual Therapy;Patient/family education;Neuromuscular education    Plan Reasess fit of over the counter sleeve and glove - and progress with pump           Patient will benefit from skilled therapeutic intervention in order to improve the following deficits and impairments:   Body Structure / Function / Physical Skills: ADL, Decreased knowledge of use of DME, Strength, Edema, UE functional use, IADL, ROM       Visit Diagnosis: Lymphedema, not elsewhere classified  Muscle weakness (generalized)  Postmastectomy lymphedema syndrome    Problem List Patient Active Problem List   Diagnosis Date Noted  . Diverticulitis of large intestine without perforation or abscess without bleeding 05/19/2016  . Left sided abdominal pain 12/29/2015  . Fatigue 09/05/2015  . Visit for preventive health examination 02/27/2015  . Hiatal hernia 02/27/2015  . Unspecified vitamin D deficiency 02/23/2014  . Chronic systolic congestive heart failure ([place])   . Screening for cervical cancer 09/15/2011  . Screening for colon cancer 09/15/2011  . Diverticulosis of colon (without mention of hemorrhage)   . Menopause   . Rheumatoid arthritis(714.0)   . History of right breast cancer   . EDEMA 07/15/2010  . CARDIOMYOPATHY, PRIMARY, DILATED 11/14/2009    Rosalyn Gess  OTR/L,CLT 02/19/2020, 2:02 PM  [place] [facility] [address], [place], [address] Phone: [phone]   Fax:  [phone]  Name: Helen Parks MRN: [id number] Date of Birth: [birth year]

## 2020-03-04 ENCOUNTER — Other Ambulatory Visit: Payer: Self-pay

## 2020-03-04 ENCOUNTER — Ambulatory Visit: Payer: Medicare Other | Attending: Rheumatology | Admitting: Occupational Therapy

## 2020-03-04 DIAGNOSIS — I890 Lymphedema, not elsewhere classified: Secondary | ICD-10-CM | POA: Diagnosis present

## 2020-03-04 DIAGNOSIS — I972 Postmastectomy lymphedema syndrome: Secondary | ICD-10-CM | POA: Insufficient documentation

## 2020-03-04 NOTE — Therapy (Signed)
[place] [facility] [address], [place], [address] Phone: [phone]   Fax:  [phone]  Occupational Therapy Treatment  Patient Details  Name: Helen Parks MRN: [id number] Date of Birth: Mar 15, 1951 Referring Provider (OT): [place], [place]   Encounter Date: 03/04/2020   OT End of Session - 03/04/20 1437    Visit Number 10    Number of Visits 10    Date for OT Re-Evaluation 03/11/20    OT Start Time 1400    OT Stop Time 1430    OT Time Calculation (min) 30 min    Activity Tolerance Patient tolerated treatment well    Behavior During Therapy [facility] for tasks assessed/performed           Past Medical History:  Diagnosis Date  . Breast cancer ([place]) 1997   s/p mastectomy, right breast, chemo, BRCA negative  . Cardiomyopathy   . 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 Chronic systolic congestive heart failure ([place])   . Collagen vascular disease (H[facility])    RA  . Diverticulosis of colon (without mention of hemorrhage) April 2012   occurred after colonoscopy, [place]  . Menopause    secondary to chemo for BRCA  . Rheumatoid arthritis ([place])   . Rheumatoid arthritis(714.0)    on Plaquenil methotrxate Precious Reel)    Past Surgical History:  Procedure Laterality Date  . ANKLE FUSION     right, seondary to erosive arthritis  . BREAST BIOPSY Right 1997   positive for breast ca  . BREAST SURGERY Right 1997   Lumpectomy  . CARDIAC CATHETERIZATION  2010   normal coronary arteries  . COLONOSCOPY WITH PROPOFOL N/A 03/26/2016   Procedure: COLONOSCOPY WITH PROPOFOL;  Surgeon: Lollie Sails, MD;  Location: [facility] ENDOSCOPY;  Service: Endoscopy;  Laterality: N/A;  . FOOT SURGERY  2004  . MASTECTOMY Right 1997  . rheumatology histroy      There were no vitals filed for this visit.   Subjective Assessment - 03/04/20 1436    Subjective  I stretch my glove and end of  sleeve every time  I wash it - my hand or fingers do not go numb anymore - but little tingling at times- but I can wear it the whole day now - and pump am and pm - and tubigrip sleeve at night time    Pertinent History Patient with a history of right breast cancer s/p mastectomy and 18 lymph node removal.  She reports she was treated in the past for lymphedema years ago but felt her symptoms resolved and therefore got rid of her Reid sleeve and daytime compression sleeves a long time ago.Patient with recent increase in edema in right wrist, forearm to elbow.    Patient Stated Goals Patient would like to manage the swelling in her right arm.    Currently in Pain? No/denies               LYMPHEDEMA/ONCOLOGY QUESTIONNAIRE - 03/04/20 0001      Right Upper Extremity Lymphedema   15 cm Proximal to Olecranon Process 28 cm    10 cm Proximal to Olecranon Process 27.3 cm    Olecranon Process 23.5 cm    15 cm Proximal to Ulnar Styloid Process 23.6 cm    10 cm Proximal to Ulnar Styloid Process 21 cm    Just Proximal to Ulnar Styloid Process 16.4 cm  Across Hand at [employer] 18.5 cm    At [place] of 2nd Digit 6.2 cm    At [facility] of Thumb 6.2 cm             Ptreport that herJobst Bella strong sleeveshe washed and stretch the wrist cuff and digits of glove again- she feels better- fingers are not going numb - at times some tingling - but can wear it all day now She can wear the new isotoner glove with the sleeve during day if feels better and can contain her hand    Upon assessment- all her circumference decrease from [facility] -and only still  increase in forearm 1.4 cm and hand /wrist 1 cm -  But she is R hand dominant Pt to cont using pump 2 x day 45 min - am and pm - no issues  But stop wearing her night time isotoner glove and tubigrip- if issues in hand -can use isotoner glove - new one issued     Pt to use pump in am and pm for 45 min to hour -              OT  Education - 03/04/20 1437    Education Details changes in wearing compression    Person(s) Educated Patient    Methods Explanation;Demonstration    Comprehension Returned demonstration;Verbalized understanding               OT Long Term Goals - 01/29/20 1341      OT LONG TERM GOAL #1   Title Patient will demonstrate independence in home program for MLD, use of compression and exercises.    Status Achieved      OT LONG TERM GOAL #2   Title Patient will demonstrate a decrease in circumferential measurements in right wrist, forearm and elbow by 2 cm to prepare for compression garment fitting.    Status Achieved      OT LONG TERM GOAL #3   Title Patient to be independent with use of compression garments to manage symptoms of lymphedema.    Baseline was fitted about week ago -but need to assess in combination with pump use - if it contain her R UE    Time 6    Period Weeks    Status On-going                 Plan - 03/04/20 1438    Clinical Impression Statement Pt's circumference now only increase 1.4 at foream , and 1 cm at wrist and hand compare to L hand - but pt is R hand dominant. Pt tolerating her Bella strong sleeve and glove better- pt to cont pumping am and pm but stop wearing night time compression and will reassess in 3 wks - if she can go to [employer] light sleeve    OT Occupational Profile and History Detailed Assessment- Review of Records and additional review of physical, cognitive, psychosocial history related to current functional performance    Occupational performance deficits (Please refer to evaluation for details): IADL's;ADL's    Body Structure / Function / Physical Skills ADL;Decreased knowledge of use of DME;Strength;Edema;UE functional use;IADL;ROM    Rehab Potential Excellent    Clinical Decision Making Limited treatment options, no task modification necessary    Comorbidities Affecting Occupational Performance: May have comorbidities impacting  occupational performance    Modification or Assistance to Complete Evaluation  No modification of tasks or assist necessary to complete eval    OT Frequency --  3 wks   OT Duration --   3 wks   OT Treatment/Interventions Self-care/ADL training;DME and/or AE instruction;Manual lymph drainage;Compression bandaging;Therapeutic activities;Therapeutic exercise;Manual Therapy;Patient/family education;Neuromuscular education    Plan reassess if was able to contain without night time compression -and can go to lighter daytime compression    Consulted and Agree with Plan of Care Patient           Patient will benefit from skilled therapeutic intervention in order to improve the following deficits and impairments:   Body Structure / Function / Physical Skills: ADL, Decreased knowledge of use of DME, Strength, Edema, UE functional use, IADL, ROM       Visit Diagnosis: Postmastectomy lymphedema syndrome  Lymphedema, not elsewhere classified    Problem List Patient Active Problem List   Diagnosis Date Noted  . Diverticulitis of large intestine without perforation or abscess without bleeding 05/19/2016  . Left sided abdominal pain 12/29/2015  . Fatigue 09/05/2015  . Visit for preventive health examination 02/27/2015  . Hiatal hernia 02/27/2015  . Unspecified vitamin D deficiency 02/23/2014  . Chronic systolic congestive heart failure ([place])   . Screening for cervical cancer 09/15/2011  . Screening for colon cancer 09/15/2011  . Diverticulosis of colon (without mention of hemorrhage)   . Menopause   . Rheumatoid arthritis(714.0)   . History of right breast cancer   . EDEMA 07/15/2010  . CARDIOMYOPATHY, PRIMARY, DILATED 11/14/2009    Rosalyn Gess  OTR/L,CLT 03/04/2020, 2:42 PM  [place] [facility] [address] [place], [address] Phone: [phone]   Fax:  [phone]  Name: Helen Parks MRN: [id number] Date of  Birth: May 04, 1951

## 2020-03-25 ENCOUNTER — Other Ambulatory Visit: Payer: Self-pay

## 2020-03-25 ENCOUNTER — Ambulatory Visit: Payer: Medicare Other | Admitting: Occupational Therapy

## 2020-03-25 DIAGNOSIS — I972 Postmastectomy lymphedema syndrome: Secondary | ICD-10-CM | POA: Diagnosis not present

## 2020-03-25 DIAGNOSIS — I890 Lymphedema, not elsewhere classified: Secondary | ICD-10-CM

## 2020-03-25 NOTE — Therapy (Signed)
[facility] [address] [place], [address] Phone: [phone]   Fax:  [phone]  Occupational Therapy Treatment  Patient Details  Name: Helen Parks MRN: [id number] Date of Birth: 12-06-1950 Referring Provider (OT): [place], [place]   Encounter Date: 03/25/2020   OT End of Session - 03/25/20 1512    Visit Number 11    Number of Visits 14    Date for OT Re-Evaluation 05/06/20    OT Start Time 1425    OT Stop Time 1455    OT Time Calculation (min) 30 min    Activity Tolerance Patient tolerated treatment well    Behavior During Therapy [facility] for tasks assessed/performed           Past Medical History:  Diagnosis Date  . Breast cancer ([place]) 1997   s/p mastectomy, right breast, chemo, BRCA negative  . Cardiomyopathy   . 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 Chronic systolic congestive heart failure ([place])   . Collagen vascular disease (HCC)    RA  . Diverticulosis of colon (without mention of hemorrhage) April 2012   occurred after colonoscopy, [place]  . Menopause    secondary to chemo for BRCA  . Rheumatoid arthritis ([place])   . Rheumatoid arthritis(714.0)    on Plaquenil methotrxate Precious Reel)    Past Surgical History:  Procedure Laterality Date  . ANKLE FUSION     right, seondary to erosive arthritis  . BREAST BIOPSY Right 1997   positive for breast ca  . BREAST SURGERY Right 1997   Lumpectomy  . CARDIAC CATHETERIZATION  2010   normal coronary arteries  . COLONOSCOPY WITH PROPOFOL N/A 03/26/2016   Procedure: COLONOSCOPY WITH PROPOFOL;  Surgeon: Lollie Sails, MD;  Location: [facility] ENDOSCOPY;  Service: Endoscopy;  Laterality: N/A;  . FOOT SURGERY  2004  . MASTECTOMY Right 1997  . rheumatology histroy      There were no vitals filed for this visit.   Subjective Assessment - 03/25/20 1511    Subjective  I lost my sleeve - my son was  moving and I could not find my sleeve - I ordered this one on [place] - but it keeps on sliding down    Pertinent History Patient with a history of right breast cancer s/p mastectomy and 18 lymph node removal.  She reports she was treated in the past for lymphedema years ago but felt her symptoms resolved and therefore got rid of her Reid sleeve and daytime compression sleeves a long time ago.Patient with recent increase in edema in right wrist, forearm to elbow.    Patient Stated Goals Patient would like to manage the swelling in her right arm.    Currently in Pain? No/denies               LYMPHEDEMA/ONCOLOGY QUESTIONNAIRE - 03/25/20 0001      Right Upper Extremity Lymphedema   15 cm Proximal to Olecranon Process 28 cm    10 cm Proximal to Olecranon Process 27 cm    Olecranon Process 23.4 cm    15 cm Proximal to Ulnar Styloid Process 23.5 cm    10 cm Proximal to Ulnar Styloid Process 20.8 cm    Just Proximal to Ulnar Styloid Process 16.8 cm    Across Hand at [employer] 19 cm    At [place] of 2nd Digit 6.5 cm  At [facility] of Thumb 6.4 cm             Ptreport that she lost her [employer] Bella strong sleeve and ordered same size but Mediven sleeve on [employer] - but she got 30-40 mmhg  - but to loose at upper arm and elbow and sliding down Doing pump 2 x day  And not compression since last time at night time     Upon assessment- all her circumference stayed same except wrist and hand increase   But she is R hand dominant and RA causing some edema and pain at times at wrist prior to lymphedema flare up  Pt to cont using pump 2 x day 45 min - am and pm - no issues  Measured pt- appear she fits in size 3 for mediven harmony - but showed pt also mediven comfort  And bella light - has wider band at wrist that maybe will feel better at the wrist   pt to get with [facility] - and get measured and fitted- but to stop at clinic same day for me to assess sleeve                        OT Long Term Goals - 03/25/20 1516      OT LONG TERM GOAL #1   Title Patient will demonstrate independence in home program for MLD, use of compression and exercises.    Status Achieved      OT LONG TERM GOAL #2   Title Patient will demonstrate a decrease in circumferential measurements in right wrist, forearm and elbow by 2 cm to prepare for compression garment fitting.    Status Achieved      OT LONG TERM GOAL #3   Title Patient to be independent with use of compression garments to manage symptoms of lymphedema.    Baseline still having hard time with garments fitting at wrist - has RA with symptoms at wrist - and lost her compression sleeve    Time 6    Period Weeks    Status On-going    Target Date 05/06/20                 Plan - 03/25/20 1513    Clinical Impression Statement Pt's maintain circumference in R UE except wrist and hand increase - pt ordered [place]lin Mediven hamony size 4- but 30-79mhg -to large at elbow and upper arm - but appear to much compresion at the wrist - pt ed on mediven comfort or Bella strong - 20-343mg to get measured and fitted at [facility] and stop at the clinic the same day for me to assess fit - pt was able to maintain her circumference without night compression - and only daysleeve and pump    OT Occupational Profile and History Detailed Assessment- Review of Records and additional review of physical, cognitive, psychosocial history related to current functional performance    Occupational performance deficits (Please refer to evaluation for details): IADL's;ADL's    Body Structure / Function / Physical Skills ADL;Decreased knowledge of use of DME;Strength;Edema;UE functional use;IADL;ROM    Rehab Potential Excellent    Clinical Decision Making Limited treatment options, no task modification necessary    Comorbidities Affecting Occupational Performance: May have comorbidities impacting occupational  performance    Modification or Assistance to Complete Evaluation  No modification of tasks or assist necessary to complete eval    OT Frequency Biweekly  OT Duration 6 weeks    OT Treatment/Interventions Self-care/ADL training;DME and/or AE instruction;Manual lymph drainage;Compression bandaging;Therapeutic activities;Therapeutic exercise;Manual Therapy;Patient/family education;Neuromuscular education    Plan assess new sleeve fit    Consulted and Agree with Plan of Care Patient           Patient will benefit from skilled therapeutic intervention in order to improve the following deficits and impairments:   Body Structure / Function / Physical Skills: ADL, Decreased knowledge of use of DME, Strength, Edema, UE functional use, IADL, ROM       Visit Diagnosis: Postmastectomy lymphedema syndrome  Lymphedema, not elsewhere classified    Problem List Patient Active Problem List   Diagnosis Date Noted  . Diverticulitis of large intestine without perforation or abscess without bleeding 05/19/2016  . Left sided abdominal pain 12/29/2015  . Fatigue 09/05/2015  . Visit for preventive health examination 02/27/2015  . Hiatal hernia 02/27/2015  . Unspecified vitamin D deficiency 02/23/2014  . Chronic systolic congestive heart failure ([place])   . Screening for cervical cancer 09/15/2011  . Screening for colon cancer 09/15/2011  . Diverticulosis of colon (without mention of hemorrhage)   . Menopause   . Rheumatoid arthritis(714.0)   . History of right breast cancer   . EDEMA 07/15/2010  . CARDIOMYOPATHY, PRIMARY, DILATED 11/14/2009    Rosalyn Gess OTR/L,CLT 03/25/2020, 3:18 PM  [facility] [address], [place], [address] Phone: [phone]   Fax:  [phone]  Name: Helen Parks MRN: [id number] Date of Birth: 1950-10-20

## 2020-04-02 ENCOUNTER — Ambulatory Visit: Payer: Medicare Other | Attending: Neurology | Admitting: Occupational Therapy

## 2020-04-02 ENCOUNTER — Other Ambulatory Visit: Payer: Self-pay

## 2020-04-02 DIAGNOSIS — I890 Lymphedema, not elsewhere classified: Secondary | ICD-10-CM

## 2020-04-02 NOTE — Therapy (Signed)
[facility] [address], [place], [address] Phone: [phone]   Fax:  [phone]  Occupational Therapy Treatment  Patient Details  Name: Helen Parks MRN: [id number] Date of Birth: 11/06/1950 Referring Provider (OT): [place], [place]   Encounter Date: 04/02/2020   OT End of Session - 04/02/20 1505    Visit Number 12    Number of Visits 12    Date for OT Re-Evaluation 04/02/20    OT Start Time 1345    OT Stop Time 1400    OT Time Calculation (min) 15 min    Activity Tolerance Patient tolerated treatment well    Behavior During Therapy [facility] for tasks assessed/performed           Past Medical History:  Diagnosis Date  . Breast cancer ([place]) 1997   s/p mastectomy, right breast, chemo, BRCA negative  . Cardiomyopathy   . 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 Chronic systolic congestive heart failure ([place])   . Collagen vascular disease (H[facility])    RA  . Diverticulosis of colon (without mention of hemorrhage) April 2012   occurred after colonoscopy, [place]  . Menopause    secondary to chemo for BRCA  . Rheumatoid arthritis ([place])   . Rheumatoid arthritis(714.0)    on Plaquenil methotrxate Precious Reel)    Past Surgical History:  Procedure Laterality Date  . ANKLE FUSION     right, seondary to erosive arthritis  . BREAST BIOPSY Right 1997   positive for breast ca  . BREAST SURGERY Right 1997   Lumpectomy  . CARDIAC CATHETERIZATION  2010   normal coronary arteries  . COLONOSCOPY WITH PROPOFOL N/A 03/26/2016   Procedure: COLONOSCOPY WITH PROPOFOL;  Surgeon: Lollie Sails, MD;  Location: [facility] ENDOSCOPY;  Service: Endoscopy;  Laterality: N/A;  . FOOT SURGERY  2004  . MASTECTOMY Right 1997  . rheumatology histroy      There were no vitals filed for this visit.   Subjective Assessment - 04/02/20 1503    Subjective  I picked up the sleeve last week  end and feeling much better - not tight at my wrist and with my glove on - not causing any numbness    Pertinent History Patient with a history of right breast cancer s/p mastectomy and 18 lymph node removal.  She reports she was treated in the past for lymphedema years ago but felt her symptoms resolved and therefore got rid of her Reid sleeve and daytime compression sleeves a long time ago.Patient with recent increase in edema in right wrist, forearm to elbow.    Patient Stated Goals Patient would like to manage the swelling in her right arm.    Currently in Pain? No/denies               LYMPHEDEMA/ONCOLOGY QUESTIONNAIRE - 04/02/20 0001      Right Upper Extremity Lymphedema   15 cm Proximal to Olecranon Process 28.5 cm    10 cm Proximal to Olecranon Process 27 cm    Olecranon Process 23.4 cm    15 cm Proximal to Ulnar Styloid Process 23.3 cm    10 cm Proximal to Ulnar Styloid Process 20.8 cm    Just Proximal to Ulnar Styloid Process 16.2 cm    Across Hand at [employer] 18 cm    At [place] of 2nd Digit 6.2 cm    At [facility]  of Thumb 6.2 cm             Ptreport that she lost her Job[employer] Bella strong sleeve  2 wks ago and ordered same size but Mediven sleeve on [employer] - but she got 30-40 mmhg  - but to loose at upper arm and elbow and sliding down last week- send pt to [employer] to get measured for Mediven harmony 20-30 mmgh  Pt picked it up last week and wore it for about 5 days Doing pump 2 x day     Upon assessment- all her circumference stayed same and wrist /hand decrease - as well as digits  denies any numbness with new sleeve in combination with her old glove    Pt to cont with sleeve and glove during day And pump am and pm  And to contact me in 6 months - will assess if she needs compression the whole day                 OT Education - 04/02/20 1505    Education Details discharge instruction    Person(s) Educated Patient    Methods  Explanation;Demonstration    Comprehension Returned demonstration;Verbalized understanding               OT Long Term Goals - 04/02/20 1508      OT LONG TERM GOAL #1   Title Patient will demonstrate independence in home program for MLD, use of compression and exercises.    Status Achieved      OT LONG TERM GOAL #2   Title Patient will demonstrate a decrease in circumferential measurements in right wrist, forearm and elbow by 2 cm to prepare for compression garment fitting.    Status Achieved      OT LONG TERM GOAL #3   Title Patient to be independent with use of compression garments to manage symptoms of lymphedema.    Status Achieved                 Plan - 04/02/20 1505    Clinical Impression Statement Pt this date with mediven harmony sleeve on R arm - she picked up last week -and wore it with her compression glove during day - and doing pump day and night time- able to maintain her circumference - and wrist and hand decrease with new compression- pt to cont with home program for 6 months and will reassess at that time if she need all day compression or can go to only with high risk activities    OT Occupational Profile and History Detailed Assessment- Review of Records and additional review of physical, cognitive, psychosocial history related to current functional performance    Occupational performance deficits (Please refer to evaluation for details): IADL's;ADL's    Body Structure / Function / Physical Skills ADL;Decreased knowledge of use of DME;Strength;Edema;UE functional use;IADL;ROM    Rehab Potential Excellent    Clinical Decision Making Limited treatment options, no task modification necessary    Comorbidities Affecting Occupational Performance: May have comorbidities impacting occupational performance    Modification or Assistance to Complete Evaluation  No modification of tasks or assist necessary to complete eval    Consulted and Agree with Plan of Care  Patient           Patient will benefit from skilled therapeutic intervention in order to improve the following deficits and impairments:   Body Structure / Function / Physical Skills: ADL, Decreased knowledge of use of DME, Strength, Edema,  UE functional use, IADL, ROM       Visit Diagnosis: Lymphedema, not elsewhere classified    Problem List Patient Active Problem List   Diagnosis Date Noted  . Diverticulitis of large intestine without perforation or abscess without bleeding 05/19/2016  . Left sided abdominal pain 12/29/2015  . Fatigue 09/05/2015  . Visit for preventive health examination 02/27/2015  . Hiatal hernia 02/27/2015  . Unspecified vitamin D deficiency 02/23/2014  . Chronic systolic congestive heart failure ([place])   . Screening for cervical cancer 09/15/2011  . Screening for colon cancer 09/15/2011  . Diverticulosis of colon (without mention of hemorrhage)   . Menopause   . Rheumatoid arthritis(714.0)   . History of right breast cancer   . EDEMA 07/15/2010  . CARDIOMYOPATHY, PRIMARY, DILATED 11/14/2009    Rosalyn Gess OTR/L,CLT 04/02/2020, 3:09 PM  [facility] [address], [place], [address] Phone: [phone]   Fax:  [phone]  Name: Helen Parks MRN: [id number] Date of Birth: 14-Sep-1950

## 2020-04-23 ENCOUNTER — Other Ambulatory Visit: Payer: Self-pay | Admitting: Internal Medicine

## 2020-04-23 DIAGNOSIS — Z1231 Encounter for screening mammogram for malignant neoplasm of breast: Secondary | ICD-10-CM

## 2020-05-12 ENCOUNTER — Other Ambulatory Visit: Payer: Self-pay | Admitting: Cardiovascular Disease

## 2020-05-20 ENCOUNTER — Other Ambulatory Visit: Payer: Self-pay

## 2020-05-20 ENCOUNTER — Ambulatory Visit
Admission: RE | Admit: 2020-05-20 | Discharge: 2020-05-20 | Disposition: A | Discharge status: Home / Self Care | Payer: Medicare Other | Source: Ambulatory Visit | Attending: Internal Medicine | Admitting: Internal Medicine

## 2020-05-20 DIAGNOSIS — Z1231 Encounter for screening mammogram for malignant neoplasm of breast: Secondary | ICD-10-CM | POA: Diagnosis not present

## 2020-05-30 ENCOUNTER — Other Ambulatory Visit: Payer: Self-pay | Admitting: Cardiovascular Disease

## 2020-06-17 DIAGNOSIS — R0602 Shortness of breath: Secondary | ICD-10-CM | POA: Diagnosis not present

## 2020-06-19 ENCOUNTER — Encounter: Payer: Self-pay | Admitting: Family

## 2020-06-19 ENCOUNTER — Ambulatory Visit (INDEPENDENT_AMBULATORY_CARE_PROVIDER_SITE_OTHER): Payer: Medicare Other | Admitting: Family

## 2020-06-19 ENCOUNTER — Other Ambulatory Visit: Payer: Self-pay

## 2020-06-19 VITALS — BP 114/60 | HR 78 | Ht 62.0 in | Wt 147.0 lb

## 2020-06-19 DIAGNOSIS — I340 Nonrheumatic mitral (valve) insufficiency: Secondary | ICD-10-CM

## 2020-06-19 DIAGNOSIS — R0600 Dyspnea, unspecified: Secondary | ICD-10-CM

## 2020-06-19 DIAGNOSIS — R0609 Other forms of dyspnea: Secondary | ICD-10-CM

## 2020-06-19 DIAGNOSIS — I5022 Chronic systolic (congestive) heart failure: Secondary | ICD-10-CM

## 2020-06-19 NOTE — Patient Instructions (Addendum)
Medication Instructions:  No medication changes today.   *If you need a refill on your cardiac medications before your next appointment, please call your pharmacy*  Lab Work: No lab work today.  If you have labs (blood work) drawn today and your tests are completely normal, you will receive your results only by: Marland Kitchen MyChart Message (if you have MyChart) OR . A paper copy in the mail If you have any lab test that is abnormal or we need to change your treatment, we will call you to review the results.  Testing/Procedures: Your physician has requested that you have a lexiscan myoview.   [facility]  Your caregiver has ordered a Stress Test with nuclear imaging. The purpose of this test is to evaluate the blood supply to your heart muscle. This procedure is referred to as a "Non-Invasive Stress Test." This is because other than having an IV started in your vein, nothing is inserted or "invades" your body. Cardiac stress tests are done to find areas of poor blood flow to the heart by determining the extent of coronary artery disease (CAD). Some patients exercise on a treadmill, which naturally increases the blood flow to your heart, while others who are  unable to walk on a treadmill due to physical limitations have a pharmacologic/chemical stress agent called Lexiscan . This medicine will mimic walking on a treadmill by temporarily increasing your coronary blood flow.   Please note: these test may take anywhere between 2-4 hours to complete  PLEASE REPORT TO [facility] AT THE FIRST DESK WILL DIRECT YOU WHERE TO GO  Date of Procedure:_____________________________________  Arrival Time for Procedure:______________________________  Instructions regarding medication:   __x__ : Hold lasix (furosemide) the morning of test  __x__:  Hold Coreg (carvedilol) the night before test and morning of test  __x__:  You may take all of your other regular medications the  morning of your test with enough water to get them down safely   PLEASE NOTIFY THE OFFICE AT LEAST 24 HOURS IN ADVANCE IF YOU ARE UNABLE TO [place].  [phone] AND  PLEASE NOTIFY NUCLEAR MEDICINE AT [facility] AT LEAST 24 HOURS IN ADVANCE IF YOU ARE UNABLE TO KEEP YOUR APPOINTMENT. [phone]  How to prepare for your Myoview test:  1. Do not eat or drink after midnight 2. No caffeine for 24 hours prior to test 3. No smoking 24 hours prior to test. 4. Your medication may be taken with water.  If your doctor stopped a medication because of this test, do not take that medication. 5. Ladies, please do not wear dresses.  Skirts or pants are appropriate. Please wear a short sleeve shirt. 6. No perfume, cologne or lotion. 7. Wear comfortable walking shoes. No heels!   Follow-Up: At [facility], you and your health needs are our priority.  As part of our continuing mission to provide you with exceptional heart care, we have created designated Provider Care Teams.  These Care Teams include your primary Cardiologist (physician) and Advanced Practice Providers (APPs -  Physician Assistants and Nurse Practitioners) who all work together to provide you with the care you need, when you need it.  We recommend signing up for the patient portal called "MyChart".  Sign up information is provided on this After Visit Summary.  MyChart is used to connect with patients for Virtual Visits (Telemedicine).  Patients are able to view lab/test results, encounter notes, upcoming appointments, etc.  Non-urgent messages can be  sent to your provider as well.   To learn more about what you can do with MyChart, go to [email or web address].    Your next appointment:   After stress test   The format for your next appointment:   In Person  Provider:   You may see Kathlyn Sacramento, MD or one of the following Advanced Practice Providers on your designated Care Team:    Laurann [place], NP  Other  Instructions    Cardiac Nuclear Scan A cardiac nuclear scan is a test that measures blood flow to the heart when a person is resting and when he or she is exercising. The test looks for problems such as:  Not enough blood reaching a portion of the heart.  The heart muscle not working normally. You may need this test if:  You have heart disease.  You have had abnormal lab results.  You have had heart surgery or a balloon procedure to open up blocked arteries (angioplasty).  You have chest pain.  You have shortness of breath. In this test, a radioactive dye (tracer) is injected into your bloodstream. After the tracer has traveled to your heart, an imaging device is used to measure how much of the tracer is absorbed by or distributed to various areas of your heart. This procedure is usually done at a hospital and takes 2-4 hours. Tell a health care provider about:  Any allergies you have.  All medicines you are taking, including vitamins, herbs, eye drops, creams, and over-the-counter medicines.  Any problems you or family members have had with anesthetic medicines.  Any blood disorders you have.  Any surgeries you have had.  Any medical conditions you have.  Whether you are pregnant or may be pregnant. What are the risks? Generally, this is a safe procedure. However, problems may occur, including:  Serious chest pain and heart attack. This is only a risk if the stress portion of the test is done.  Rapid heartbeat.  Sensation of warmth in your chest. This usually passes quickly.  Allergic reaction to the tracer. What happens before the procedure?  Ask your health care provider about changing or stopping your regular medicines. This is especially important if you are taking diabetes medicines or blood thinners.  Follow instructions from your health care provider about eating or drinking restrictions.  Remove your jewelry on the day of the procedure. What happens  during the procedure?  An IV will be inserted into one of your veins.  Your health care provider will inject a small amount of radioactive tracer through the IV.  You will wait for 20-40 minutes while the tracer travels through your bloodstream.  Your heart activity will be monitored with an electrocardiogram (ECG).  You will lie down on an exam table.  Images of your heart will be taken for about 15-20 minutes.  You may also have a stress test. For this test, one of the following may be done: ? You will exercise on a treadmill or stationary bike. While you exercise, your heart's activity will be monitored with an ECG, and your blood pressure will be checked. ? You will be given medicines that will increase blood flow to parts of your heart. This is done if you are unable to exercise.  When blood flow to your heart has peaked, a tracer will again be injected through the IV.  After 20-40 minutes, you will get back on the exam table and have more images taken of your heart.  Depending on  the type of tracer used, scans may need to be repeated 3-4 hours later.  Your IV line will be removed when the procedure is over. The procedure may vary among health care providers and hospitals. What happens after the procedure?  Unless your health care provider tells you otherwise, you may return to your normal schedule, including diet, activities, and medicines.  Unless your health care provider tells you otherwise, you may increase your fluid intake. This will help to flush the contrast dye from your body. Drink enough fluid to keep your urine pale yellow.  Ask your health care provider, or the department that is doing the test: ? When will my results be ready? ? How will I get my results? Summary  A cardiac nuclear scan measures the blood flow to the heart when a person is resting and when he or she is exercising.  Tell your health care provider if you are pregnant.  Before the procedure,  ask your health care provider about changing or stopping your regular medicines. This is especially important if you are taking diabetes medicines or blood thinners.  After the procedure, unless your health care provider tells you otherwise, increase your fluid intake. This will help flush the contrast dye from your body.  After the procedure, unless your health care provider tells you otherwise, you may return to your normal schedule, including diet, activities, and medicines. This information is not intended to replace advice given to you by your health care provider. Make sure you discuss any questions you have with your health care provider. Document Revised: 01/30/2018 Document Reviewed: 01/30/2018 Elsevier Patient Education  [place].

## 2020-06-19 NOTE — Progress Notes (Signed)
Office Visit    Patient Name: Helen Parks Date of Encounter: 06/19/2020  Primary Care Provider:  Idelle Crouch, MD Primary Cardiologist:  No primary care provider on file. Electrophysiologist:  None   Chief Complaint    Helen Parks is a 69 y.o. 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 Breast cancer ([place]) 1997   s/p mastectomy, right breast, chemo, BRCA negative  . Cardiomyopathy   . 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[place])    precipitated by use of Enbrel  2010  . Chronic systolic congestive heart failure ([place])   . Collagen vascular disease (HCC)    RA  . Diverticulosis of colon (without mention of hemorrhage) April 2012   occurred after colonoscopy, [place]  . Menopause    secondary to chemo for BRCA  . Rheumatoid arthritis ([place])   . Rheumatoid arthritis(714.0)    on Plaquenil methotrxate Precious Reel)   Past Surgical History:  Procedure Laterality Date  . ANKLE FUSION     right, seondary to erosive arthritis  . BREAST BIOPSY Right 1997   positive for breast ca  . BREAST SURGERY Right 1997   Lumpectomy  . CARDIAC CATHETERIZATION  2010   normal coronary arteries  . COLONOSCOPY WITH PROPOFOL N/A 03/26/2016   Procedure: COLONOSCOPY WITH PROPOFOL;  Surgeon: Lollie Sails, MD;  Location: [facility] ENDOSCOPY;  Service: Endoscopy;  Laterality: N/A;  . FOOT SURGERY  2004  . MASTECTOMY Right 1997  . rheumatology histroy      Allergies  Allergies  Allergen Reactions  . Amoxicillin     REACTION: upset stomach  . Sulfasalazine Other (See Comments)    Other reaction(s): Other (See Comments) GI  upset  . Latex Rash    History of Present Illness    Helen Parks is a 69 y.o. 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  She was last seen 12/20/2019 by Dr. Fletcher Anon.  She had normal coronary arteries by catheterization in 2010.  2005 EF 30%.  Episode of heart failure 2010 triggered by Enbrel.  Initial EF 25-30% which improved to 40-45% in June 2016.  Most recent echo July 2019 EF 40-45% with mild to moderate MR.  When seen in clinic 12/20/2019 she noted recent episodes of chest tightness at night when sleeping in the setting of stress after 2 deaths in her family.  Also worsening lower extremity edema requiring increased Lasix dose.  She was recommended for echocardiogram and consideration of transition from lisinopril to [facility].  She does have a history of intermittent hypertension and caution would need to be used.  Echo 01/17/2020 LVEF 45-50%, LV global hypokinesis, grade 1 diastolic dysfunction, RV normal size and function, normal PASP, LA mildly dilated, moderate MR, mild to moderate TR.  She has been on Orencia for rheumatoid arthritis with shortness of breath after the dosing. Her rheumatologist ordered CMP, CBC, CPK as well as CXR.  06/17/20 Labs via Care Everywhere:  CK 65, Hb 12.9, GFR 72, AST 19, ALT 13, K 4.3, CO2 33.2, creatinine 0.8  Presents today for chief complaint of dyspnea on exertion question of her rheumatologist.  Tells me since starting Orencia she has noticed some increasing DOE in recent months though of note did start this medication in April. Tells me August she had her infusion and she went to the beach and noted some dyspnea with walking in the sand. In September she had her infusion then did a lot of walking in  [place] with walking through the mountains. Tells me after her October infusion on the 12th she notices symptoms have lasted longer. Previously lasted less than one week. Tells me this time she noticed it the weekend and Monday it was worse. She is find when she sit, but notices dyspnea with exertion. Tells me it feels tight when she gets short of breath.   Takes one Lasix in the morning and one at lunch. Tells me her right lower extremity did not go down completely though it normally does go down. Tells me she is short of breath when she first lays down (orthopnea) though this quickly resolved and she reports no PND. Weight remains stable.  EKGs/Labs/Other Studies Reviewed:   The following studies were reviewed today:  Echo 01/25/2020  1. Left ventricular ejection fraction, by estimation, is 45 to 50%. The  left ventricle has mildly decreased function. The left ventricle  demonstrates global hypokinesis. Left ventricular diastolic parameters are  consistent with Grade I diastolic  dysfunction (impaired relaxation).   2. Right ventricular systolic function is normal. The right ventricular  size is normal. There is normal pulmonary artery systolic pressure. The  estimated right ventricular systolic pressure is 26.3 mmHg.   3. Left atrial size was mildly dilated.   4. Moderate mitral valve regurgitation.   5. Tricuspid valve regurgitation is mild to moderate.   EKG:  EKG is  ordered today.  The ekg ordered today demonstrates NSR 78 bpm with possible prior septal infarct  Recent Labs: No results found for requested labs within last 8760 hours.  Recent Lipid Panel    Component Value Date/Time   CHOL 165 11/02/2016 1520   TRIG 110.0 11/02/2016 1520   TRIG 82 02/04/2010 0000   HDL 55.80 11/02/2016 1520   CHOLHDL 3 11/02/2016 1520   VLDL 22.0 11/02/2016 1520   LDLCALC 87 11/02/2016 1520   LDLDIRECT 87.0 11/02/2016 1520    Home Medications   Current Meds  Medication Sig  . Calcium  150 MG TABS Take by mouth daily.    . carvedilol (COREG) 6.25 MG tablet TAKE 1 TABLET BY MOUTH TWICE A DAY  . cholecalciferol (VITAMIN D) 1000 UNITS tablet Take 2,000 Units by mouth daily.   . folic acid (FOLVITE) 1 MG tablet Take 1 mg by mouth daily.    . furosemide (LASIX) 20 MG tablet TAKE 1 TABLET BY MOUTH TWICE A DAY  . hydroxychloroquine (PLAQUENIL) 200 MG tablet Take 200 mg by mouth daily.   Mar[place] Kitchen lisinopril (ZESTRIL) 10 MG tablet TAKE 1 TABLET BY MOUTH EVERY DAY  . methotrexate 50 MG/2ML injection INJECT 1ML SUBCUTANEOUSLY ONCE A WEEK; takes 25 mg/mL  . spironolactone (ALDACTONE) 25 MG tablet TAKE 1 TABLET BY MOUTH EVERY DAY     Review of Systems   Review of Systems  Constitutional: Negative for chills, fever and malaise/fatigue.  Cardiovascular: Positive for chest pain ("chest tightness with dyspnea"), dyspnea on exertion, leg  swelling and orthopnea. Negative for near-syncope, palpitations and syncope.  Respiratory: Negative for cough, shortness of breath and wheezing.   Gastrointestinal: Negative for nausea and vomiting.  Neurological: Negative for dizziness, light-headedness and weakness.   All other systems reviewed and are otherwise negative except as noted above.  Physical Exam    VS:  BP 114/60 (BP Location: Left Arm, Patient Position: Sitting, Cuff Size: Normal)   Pulse 78   Ht $R'5\' 2"'xp$  (1.575 m)   Wt 147 lb (66.7 kg)   SpO2 98%   BMI 26.89 kg/m  , BMI Body mass index is 26.89 kg/m.  Wt Readings from Last 3 Encounters:  06/19/20 147 lb (66.7 kg)  12/20/19 147 lb 2 oz (66.7 kg)  03/29/19 142 lb 12 oz (64.8 kg)    GEN: Well nourished, well developed, in no acute distress. HEENT: normal. Neck: Supple, no JVD, carotid bruits, or masses. Cardiac: RRR, no murmurs, rubs, or gallops. No clubbing, cyanosis, edema.  Radials/DP/PT 2+ and equal bilaterally.  Respiratory:  Respirations regular and unlabored, clear to auscultation bilaterally. GI: Soft, nontender,  nondistended. MS: No deformity or atrophy. Skin: Warm and dry, no rash. Neuro:  Strength and sensation are intact. Psych: Normal affect.  Assessment & Plan    1. Dyspnea on exertion -Notes worsening dyspnea on exertion over the last 3 months associated with chest tightness.  EKG today sinus rhythm with possible prior septal infarct.   Echo 01/25/20 LVEF 45-50%, moderate MR, mild to moderate TR.  She is overall euvolemic on exam though some nonpitting edema to R ankle at previous surgical site. Weight stable. Due to worsening dyspnea and chest tightness, plan for [facility]. Of note symptoms worse after Orencia doses, but will rule out ischemia as contributory as her last ischemic evaluation was in 2010.   2. Chronic combined systolic and diastolic heart failure-Echo 01/25/2020 EF 45-50%. RLE with nonpitting edema to the ankle though weight remains stable.  Continue GDMT including Coreg, Lasix, lisinopril, spironolactone.  If Lexiscan Myoview unrevealing, consider escalation of heart failure therapies and/or participation in cardiac rehab.  3. MR-Echo 01/25/2020 with moderate MR, mild to moderate TR.  Continue optimal BP and heart rate control.  4. Rheumatoid arthritis-continue to follow with rheumatology.  Of note, recent dyspnea seems to be exacerbated after taking Orencia.  Plan for [facility], as above however if this is unrevealing may need to consider transition to alternate agent.  Disposition: Follow up after lexiscan myoview with Dr. Fletcher Anon or APP  Signed, Loel Dubonnet, NP 06/19/2020, 9:45 AM [place]

## 2020-06-23 ENCOUNTER — Other Ambulatory Visit: Payer: Self-pay

## 2020-06-23 ENCOUNTER — Encounter
Admission: RE | Admit: 2020-06-23 | Discharge: 2020-06-23 | Disposition: A | Discharge status: Home / Self Care | Payer: Medicare Other | Source: Ambulatory Visit | Attending: Family | Admitting: Family

## 2020-06-23 DIAGNOSIS — R0600 Dyspnea, unspecified: Secondary | ICD-10-CM | POA: Diagnosis not present

## 2020-06-23 DIAGNOSIS — R0609 Other forms of dyspnea: Secondary | ICD-10-CM

## 2020-06-23 LAB — NM MYOCAR MULTI W/SPECT W/WALL MOTION / EF
LV dias vol: 131 mL (ref 46–106)
LV sys vol: 57 mL
Peak HR: 111 {beats}/min
Percent HR: 72 %
Rest HR: 70 {beats}/min
SDS: 0
SRS: 24
SSS: 3
TID: 0.76

## 2020-06-23 MED ORDER — REGADENOSON 0.4 MG/5ML IV SOLN
0.4000 mg | Freq: Once | INTRAVENOUS | Status: AC
  Administered 2020-06-23: 0.4 mg via INTRAVENOUS

## 2020-06-23 MED ORDER — TECHNETIUM TC 99M TETROFOSMIN IV KIT
30.0000 | PACK | Freq: Once | INTRAVENOUS | Status: AC | PRN
  Administered 2020-06-23: 31.992 via INTRAVENOUS

## 2020-06-23 MED ORDER — TECHNETIUM TC 99M TETROFOSMIN IV KIT
10.0000 | PACK | Freq: Once | INTRAVENOUS | Status: AC | PRN
  Administered 2020-06-23: 10.046 via INTRAVENOUS

## 2020-06-25 NOTE — Addendum Note (Signed)
Addended by: Janan Ridge on: 06/25/2020 09:23 AM   Modules accepted: Orders

## 2020-06-26 ENCOUNTER — Other Ambulatory Visit: Payer: Self-pay

## 2020-06-26 ENCOUNTER — Ambulatory Visit: Payer: Medicare Other | Attending: Neurology | Admitting: Occupational Therapy

## 2020-06-26 DIAGNOSIS — I890 Lymphedema, not elsewhere classified: Secondary | ICD-10-CM

## 2020-06-26 DIAGNOSIS — I972 Postmastectomy lymphedema syndrome: Secondary | ICD-10-CM

## 2020-06-26 NOTE — Therapy (Signed)
[facility] [address] [place], [address] Phone: [phone]   Fax:  [phone]  Occupational Therapy Screen  Patient Details  Name: Helen Parks MRN: [id number] Date of Birth: 24-Oct-1950 Referring Provider (OT): [place], [place]   Encounter Date: 06/26/2020   OT End of Session - 06/26/20 0854    Visit Number 0           Past Medical History:  Diagnosis Date  . Breast cancer ([place]) 1997   s/p mastectomy, right breast, chemo, BRCA negative  . Cardiomyopathy   . 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[place])    precipitated by use of Enbrel  2010  . Chronic systolic congestive heart failure ([place])   . Collagen vascular disease (HCC)    RA  . Diverticulosis of colon (without mention of hemorrhage) April 2012   occurred after colonoscopy, [place]  . Menopause    secondary to chemo for BRCA  . Rheumatoid arthritis ([place])   . Rheumatoid arthritis(714.0)    on Plaquenil methotrxate Precious Reel)    Past Surgical History:  Procedure Laterality Date  . ANKLE FUSION     right, seondary to erosive arthritis  . BREAST BIOPSY Right 1997   positive for breast ca  . BREAST SURGERY Right 1997   Lumpectomy  . CARDIAC CATHETERIZATION  2010   normal coronary arteries  . COLONOSCOPY WITH PROPOFOL N/A 03/26/2016   Procedure: COLONOSCOPY WITH PROPOFOL;  Surgeon: Lollie Sails, MD;  Location: [facility] ENDOSCOPY;  Service: Endoscopy;  Laterality: N/A;  . FOOT SURGERY  2004  . MASTECTOMY Right 1997  . rheumatology histroy      There were no vitals filed for this visit.   Subjective Assessment - 06/26/20 0852    Subjective  I am just curious to see what my measurements of my arm is - but I am okay with my sleeve and doing pump    Patient Stated Goals Patient would like to manage the swelling in her right arm.    Currently in Pain? No/denies               LYMPHEDEMA/ONCOLOGY QUESTIONNAIRE - 06/26/20 0001       Right Upper Extremity Lymphedema   15 cm Proximal to Olecranon Process 29 cm    10 cm Proximal to Olecranon Process 28 cm    Olecranon Process 23.4 cm    15 cm Proximal to Ulnar Styloid Process 23.4 cm    10 cm Proximal to Ulnar Styloid Process 21 cm    Just Proximal to Ulnar Styloid Process 16.2 cm    Across Hand at [employer] 18 cm    At [place] of 2nd Digit 6.1 cm    At [facility] of Thumb 6.4 cm           Measurements taken - see flow sheet Her circumference stayed same since early August- but did increase in upper arm but still in range with L UE  Forearm still increase Pt report she likes the [employer] [place] 20-30 mmHg  And wearing [employer] 20-30 mmHg glove Wearing most of day and still using her pump am and pm for 45 min to hour                        OT Long Term Goals - 04/02/20 1508      OT LONG TERM GOAL #1   Title Patient will demonstrate independence  in home program for MLD, use of compression and exercises.    Status Achieved      OT LONG TERM GOAL #2   Title Patient will demonstrate a decrease in circumferential measurements in right wrist, forearm and elbow by 2 cm to prepare for compression garment fitting.    Status Achieved      OT LONG TERM GOAL #3   Title Patient to be independent with use of compression garments to manage symptoms of lymphedema.    Status Achieved                  Patient will benefit from skilled therapeutic intervention in order to improve the following deficits and impairments:           Visit Diagnosis: Postmastectomy lymphedema syndrome  Lymphedema, not elsewhere classified    Problem List Patient Active Problem List   Diagnosis Date Noted  . Diverticulitis of large intestine without perforation or abscess without bleeding 05/19/2016  . Left sided abdominal pain 12/29/2015  . Fatigue 09/05/2015  . Visit for preventive health examination 02/27/2015  . Hiatal hernia  02/27/2015  . Unspecified vitamin D deficiency 02/23/2014  . Chronic systolic congestive heart failure ([place])   . Screening for cervical cancer 09/15/2011  . Screening for colon cancer 09/15/2011  . Diverticulosis of colon (without mention of hemorrhage)   . Menopause   . Rheumatoid arthritis(714.0)   . History of right breast cancer   . EDEMA 07/15/2010  . CARDIOMYOPATHY, PRIMARY, DILATED 11/14/2009    Rosalyn Gess OTR/l,CLT 06/26/2020, 8:55 AM  [facility] [address] [place], [address] Phone: [phone]   Fax:  [phone]  Name: JUANETTA NEGASH MRN: [id number] Date of Birth: 1951/06/15

## 2020-07-01 ENCOUNTER — Ambulatory Visit (INDEPENDENT_AMBULATORY_CARE_PROVIDER_SITE_OTHER): Payer: Medicare Other | Admitting: Family

## 2020-07-01 ENCOUNTER — Encounter: Payer: Self-pay | Admitting: Family

## 2020-07-01 ENCOUNTER — Other Ambulatory Visit: Payer: Self-pay

## 2020-07-01 VITALS — BP 106/62 | HR 76 | Ht 62.0 in | Wt 148.4 lb

## 2020-07-01 DIAGNOSIS — R0609 Other forms of dyspnea: Secondary | ICD-10-CM

## 2020-07-01 DIAGNOSIS — K30 Functional dyspepsia: Secondary | ICD-10-CM | POA: Diagnosis not present

## 2020-07-01 DIAGNOSIS — I5022 Chronic systolic (congestive) heart failure: Secondary | ICD-10-CM | POA: Diagnosis not present

## 2020-07-01 DIAGNOSIS — R0600 Dyspnea, unspecified: Secondary | ICD-10-CM

## 2020-07-01 DIAGNOSIS — K449 Diaphragmatic hernia without obstruction or gangrene: Secondary | ICD-10-CM | POA: Diagnosis not present

## 2020-07-01 DIAGNOSIS — I340 Nonrheumatic mitral (valve) insufficiency: Secondary | ICD-10-CM

## 2020-07-01 MED ORDER — PANTOPRAZOLE SODIUM 20 MG PO TBEC: 20.0000 mg | DELAYED_RELEASE_TABLET | Freq: Every day | ORAL | 1 refills | Status: DC

## 2020-07-01 MED ORDER — FAMOTIDINE 20 MG PO TABS: 20.0000 mg | ORAL_TABLET | Freq: Two times a day (BID) | ORAL | 0 refills | Status: DC

## 2020-07-01 NOTE — Patient Instructions (Addendum)
Medication Instructions:  Your physician has recommended you make the following change in your medication:   START Famotidine (Pepcid) 20mg  twice daily  CHANGE Lasix to 40mg  in the morning and 20mg  in the afternoon for 4 days  THEN return to Lasix 20mg  in the morning and 20mg  in the afternoon  *If you need a refill on your cardiac medications before your next appointment, please call your pharmacy*   Lab Work: None ordered today.   Testing/Procedures: Your stress test showed no blockages or coronary artery calcification which is a great result!  Follow-Up: At [facility], you and your health needs are our priority.  As part of our continuing mission to provide you with exceptional heart care, we have created designated Provider Care Teams.  These Care Teams include your primary Cardiologist (physician) and Advanced Practice Providers (APPs -  Physician Assistants and Nurse Practitioners) who all work together to provide you with the care you need, when you need it.  We recommend signing up for the patient portal called "MyChart".  Sign up information is provided on this After Visit Summary.  MyChart is used to connect with patients for Virtual Visits (Telemedicine).  Patients are able to view lab/test results, encounter notes, upcoming appointments, etc.  Non-urgent messages can be sent to your provider as well.   To learn more about what you can do with MyChart, go to [email or web address].    Your next appointment:   In April 2022 with Dr. Fletcher Anon  Other Instructions  Loel Dubonnet, NP will send MyChart message early next week to check in on your symptoms. Please weigh yourself daily and keep a log.

## 2020-07-01 NOTE — Progress Notes (Signed)
Office Visit    Patient Name: Helen Parks Date of Encounter: 07/01/2020  Primary Care Provider:  Idelle Crouch, MD Primary Cardiologist:  Kathlyn Sacramento, MD Electrophysiologist:  None   Chief Complaint    Helen Parks is a 69 y.o. 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   Past Medical History    Past Medical History:  Diagnosis Date   Breast cancer ([place]) 1997   s/p mastectomy, right breast, chemo, BRCA negative   Cardiomyopathy    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[place])    precipitated by use of Enbrel  1950   Chronic systolic congestive heart failure (HCC)    Collagen vascular disease ([place])    RA   Diverticulosis of colon (without mention of hemorrhage) April 2012   occurred after colonoscopy, [place]   Menopause    secondary to chemo for BRCA   Rheumatoid arthritis ([place])    Rheumatoid arthritis(714.0)    on Plaquenil methotrxate Precious Reel)   Past Surgical History:  Procedure Laterality Date   ANKLE FUSION     right, seondary to erosive arthritis   BREAST BIOPSY Right 1997   positive for breast ca   BREAST SURGERY Right 1997   Lumpectomy   CARDIAC CATHETERIZATION  2010   normal coronary arteries   COLONOSCOPY WITH PROPOFOL N/A 03/26/2016   Procedure: COLONOSCOPY WITH PROPOFOL;  Surgeon: Lollie Sails, MD;  Location: [facility] END[facility]COPY;  Service: Endoscopy;  Laterality: N/A;   FOOT SURGERY  2004   MASTECTOMY Right 1997   rheumatology histroy     Allergies  Allergies  Allergen Reactions   Amoxicillin     REACTION: upset stomach   Sulfasalazine Other (See Comments)    Other reaction(s): Other (See Comments) GI upset    Latex Rash   History of Present Illness    Helen Parks is a 69 y.o. 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  She was last seen 06/10/20.  She had normal coronary arteries by catheterization in 2010.  2005 EF 30%.  Episode of heart failure 2010 triggered by Enbrel.  Initial EF 25-30% which improved to 40-45% in June 2016.  Most recent echo July 2019 EF 40-45% with mild to moderate MR.  When seen in clinic 12/20/2019 she noted recent episodes of chest tightness at night when sleeping in the setting of stress after 2 deaths in her family.  Also worsening lower extremity edema requiring increased Lasix dose.  She was recommended for echocardiogram and consideration of transition from lisinopril to [facility].  She does have a history of intermittent hypertension and caution would need to be used.  Echo 01/17/2020 LVEF 45-50%, LV global hypokinesis, grade 1 diastolic dysfunction, RV normal size and function, normal PASP, LA mildly dilated, moderate MR, mild to moderate TR.  She has been on Orencia for rheumatoid arthritis with shortness of breath after the dosing. Her rheumatologist ordered CMP, CBC, CPK as well as CXR.  06/17/20 Labs via Care Everywhere:  CK 65, Hb 12.9, GFR 72, AST 19, ALT 13, K 4.3, CO2 33.2, creatinine 0.8  Seen 06/19/20 for DOE. Tells me since starting Orencia she has noticed some increasing DOE in recent months though of note did start this medication in April. Tells me August she had her infusion and she went to the beach and noted some dyspnea with walking in the sand. In September she had her infusion then did a lot of walking in [place] with walking through the mountains. Tells me after her October infusion on the  12th she notices symptoms have lasted longer. Previously lasted less than one week. Subsequent Leane Call 06/23/20 was intermediate risk due to reduced EF (45-54%) but without evidence of ischemia or prior infarct. Her attenuation correction CT images were without evidence of aortic or coronary calcifications.   Reviewed stress test result.  She notes continued dyspnea on exertion and that yesterday and today were worse than over the weekend.  It seems to be fluctuating.  She does have hiatal hernia and wonders if this could be contributory. She notices increasing sensation of fullness as well as indigestion and belching. SHe has not taken anything in the past but tums.  Tells me her weight in March was 139 pounds and she is up about eight pounds from her baseline weight.  She takes her Lasix 20 mg in the morning and 20 mg at lunchtime.  She additionally takes lisinopril and spironolactone at lunchtime.  Reports orthopnea when she first lays down quickly resolves and no PND.  She has trace lower extremity edema.  EKGs/Labs/Other Studies Reviewed:   The following studies were reviewed today:  Lexiscan Myoview 06/23/20  There was no ST segment deviation noted during stress.  No T wave inversion was noted during stress.  The left ventricular ejection fraction is mildly decreased (45-54%).  This is an intermediate risk study mainly due to reduced ejection fraction.  No evidence of perfusion defects on stress images.  Rest images are suboptimal due to intense GI uptake interfering with the inferior border of the heart.  Overall, no evidence of ischemia or prior infarct.  Attenuation correction CT images showed no evidence of aortic or coronary calcifications.   Echo 01/25/2020  1. Left ventricular ejection fraction, by estimation, is 45 to 50%. The  left ventricle has mildly decreased function. The left ventricle  demonstrates global hypokinesis. Left ventricular diastolic parameters  are  consistent with Grade I diastolic  dysfunction (impaired relaxation).   2. Right ventricular systolic function is normal. The right ventricular  size is normal. There is normal pulmonary artery systolic pressure. The  estimated right ventricular systolic pressure is 81.4 mmHg.   3. Left atrial size was mildly dilated.   4. Moderate mitral valve regurgitation.   5. Tricuspid valve regurgitation is mild to moderate.   EKG:  No EKG is  ordered today.  The ekg independently reviewed from 06/19/20 demonstrated NSR 78 bpm with possible prior septal infarct  Recent Labs: No results found for requested labs within last 8760 hours.  Recent Lipid Panel    Component Value Date/Time   CHOL 165 11/02/2016 1520   TRIG 110.0 11/02/2016 1520   TRIG 82 02/04/2010 0000   HDL 55.80 11/02/2016 1520   CHOLHDL 3 11/02/2016 1520   VLDL 22.0 11/02/2016 1520   LDLCALC 87 11/02/2016 1520   LDLDIRECT 87.0 11/02/2016 1520    Home Medications   Current Meds  Medication Sig   methotrexate 250 MG/10ML injection Inject into  the skin.     Review of Systems  All other systems reviewed and are otherwise negative except as noted above.  Physical Exam    VS:  There were no vitals taken for this visit. , BMI There is no height or weight on file to calculate BMI.  Wt Readings from Last 3 Encounters:  06/19/20 147 lb (66.7 kg)  12/20/19 147 lb 2 oz (66.7 kg)  03/29/19 142 lb 12 oz (64.8 kg)    GEN: Well nourished, well developed, in no acute distress. HEENT: normal. Neck: Supple, no JVD, carotid bruits, or masses. Cardiac: RRR, no murmurs, rubs, or gallops. No clubbing, cyanosis. Nonpitting bilateral LE edema..  Radials/DP/PT 2+ and equal bilaterally.  Respiratory:  Respirations regular and unlabored, clear to auscultation bilaterally. GI: Soft, nontender, nondistended. MS: No deformity or atrophy. Skin: Warm and dry, no rash. Neuro:  Strength and sensation are intact. Psych: Normal  affect.  Assessment & Plan    1. Dyspnea on exertion -  Echo 01/25/20 LVEF 45-50%, moderate MR, mild to moderate TR. Lexiscan Myoview intermediate risk with EF 45-55% and no evidence of ischemia.  CT attenuation images without evidence of aortic atherosclerosis or coronary artery calcification.  Dyspnea on exertion has been worse over Orencia doses and her rheumatologist has made adjustment.  Concern the hiatal hernia versus fluid retention could be contributory.  Start Pepcid 20 mg twice daily -will avoid PPI as she is on methotrexate.  Increase Lasix to 40 mg in the morning and 20 mg in the afternoon for 4 days then return to Lasix 20 mg twice daily.  2. Chronic combined systolic and diastolic heart failure-Echo 01/25/2020 EF 45-50%.  Continue GDMT including Coreg, Lasix, lisinopril, spironolactone.  Increased dose of Lasix for 40s, as above.  Will recheck on symptoms via MyChart message on Monday.  3. MR-Echo 01/25/2020 with moderate MR, mild to moderate TR.  Continue optimal BP and heart rate control.  4. Rheumatoid arthritis-Continue to follow with rheumatology.    Disposition: Follow up April 2022 with Dr. Fletcher Anon or APP  Signed, Loel Dubonnet, NP 07/01/2020, 7:36 AM [place]

## 2020-07-24 ENCOUNTER — Other Ambulatory Visit: Payer: Self-pay | Admitting: Family

## 2020-10-16 ENCOUNTER — Other Ambulatory Visit: Payer: Self-pay | Admitting: Cardiovascular Disease

## 2020-10-21 ENCOUNTER — Other Ambulatory Visit: Payer: Self-pay | Admitting: Cardiovascular Disease

## 2020-10-21 ENCOUNTER — Other Ambulatory Visit: Payer: Self-pay | Admitting: Family

## 2020-10-21 NOTE — Telephone Encounter (Signed)
Rx request sent to pharmacy.  

## 2020-10-24 ENCOUNTER — Other Ambulatory Visit: Payer: Self-pay | Admitting: Family

## 2020-10-24 ENCOUNTER — Other Ambulatory Visit: Payer: Self-pay | Admitting: Cardiovascular Disease

## 2020-11-07 ENCOUNTER — Other Ambulatory Visit: Payer: Self-pay | Admitting: Cardiovascular Disease

## 2020-11-07 ENCOUNTER — Telehealth: Payer: Self-pay | Admitting: Family

## 2020-11-07 ENCOUNTER — Other Ambulatory Visit: Payer: Self-pay | Admitting: Family

## 2020-11-07 NOTE — Telephone Encounter (Signed)
[place] for one time fill?

## 2020-11-07 NOTE — Telephone Encounter (Signed)
Okay for 30 day supply with 0 refills. She can also purchase over the counter. Thanks!

## 2020-11-07 NOTE — Telephone Encounter (Signed)
*  STAT* If patient is at the pharmacy, call can be transferred to refill team.   1. Which medications need to be refilled? (please list name of each medication and dose if known)   Famotidine 20 mg po BID  2. Which pharmacy/location (including street and city if local pharmacy) is medication to be sent to?  [facility]   3. Do they need a 30 day or 90 day supply? 90   Patient aware she may need to call pcp office for this refill but requesting we fill this time

## 2020-11-11 MED ORDER — FAMOTIDINE 20 MG PO TABS: 20.0000 mg | ORAL_TABLET | Freq: Two times a day (BID) | ORAL | 0 refills | Status: DC

## 2020-11-11 NOTE — Telephone Encounter (Signed)
Rx request sent to pharmacy.  

## 2020-11-11 NOTE — Addendum Note (Signed)
Addended by: Ernie Hew D on: 11/11/2020 03:55 PM   Modules accepted: Orders

## 2020-11-19 ENCOUNTER — Encounter: Payer: Self-pay | Admitting: *Deleted

## 2020-11-26 ENCOUNTER — Other Ambulatory Visit: Payer: Self-pay | Admitting: Cardiovascular Disease

## 2021-01-01 ENCOUNTER — Other Ambulatory Visit: Payer: Self-pay | Admitting: Cardiovascular Disease

## 2021-01-01 ENCOUNTER — Other Ambulatory Visit: Payer: Self-pay

## 2021-01-01 ENCOUNTER — Encounter: Payer: Self-pay | Admitting: Cardiovascular Disease

## 2021-01-01 ENCOUNTER — Ambulatory Visit (INDEPENDENT_AMBULATORY_CARE_PROVIDER_SITE_OTHER): Payer: Medicare Other | Admitting: Cardiovascular Disease

## 2021-01-01 VITALS — BP 110/60 | HR 75 | Ht 62.0 in | Wt 150.0 lb

## 2021-01-01 DIAGNOSIS — I340 Nonrheumatic mitral (valve) insufficiency: Secondary | ICD-10-CM

## 2021-01-01 DIAGNOSIS — I5022 Chronic systolic (congestive) heart failure: Secondary | ICD-10-CM | POA: Diagnosis not present

## 2021-01-01 MED ORDER — DAPAGLIFLOZIN PROPANEDIOL 10 MG PO TABS: 10.0000 mg | ORAL_TABLET | Freq: Every day | ORAL | 11 refills | Status: DC

## 2021-01-01 MED ORDER — FUROSEMIDE 20 MG PO TABS: 20.0000 mg | ORAL_TABLET | Freq: Every day | ORAL | 1 refills | Status: DC

## 2021-01-01 NOTE — Telephone Encounter (Signed)
Changes Requested   FARXIGA 10 MG TABS tablet       Changed from: dapagliflozin propanediol (FARXIGA) 10 MG TABS tablet   Sig: TAKE 1 TABLET BY MOUTH DAILY BEFORE BREAKFAST.   Disp:  30 tablet    Refills:  11   Start: 01/01/2021   Class: Normal   Non-formulary   Last ordered: Today by Wellington Hampshire, MD Last refill: 01/01/2021   Rx #: [id number]   Pharmacy comment: Alternative Requested:DRUG NOT ON FORMULARY. WOULD YOU LIKE TO CONTACT INSURANCE TO GET APPROVAL? THANKS!1.

## 2021-01-01 NOTE — Progress Notes (Signed)
Cardiology Office Note   Date:  01/01/2021   ID:  Helen Parks, DOB [birth year], MRN [id number]  PCP:  Idelle Crouch, MD  Cardiologist:   Kathlyn Sacramento, MD   Chief Complaint  Patient presents with  . Follow-up    6 month f/u no complaints today. Meds reviewed verbally with pt.      History of Present Illness: Helen Parks is a 70 y.o. 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 She had normal coronary arteries by catheterization in 2010, history of rheumatoid arthritis, breast cancer, status post mastectomy and chemotherapy in 1997  with ejection fraction noted to be 30% in 2005.  She had an episode of heart failure in 2010 which was likely triggered by the use of Enbrel for rheumatoid arthritis.  Her initial ejection fraction was 25-30% which gradually improved to 40-45% in June 2016.  Repeat echocardiogram in July 2019 showed an EF of 40 to 45% with mild to moderate mitral regurgitation.  She reported chest pain episodes in April 2021 in the setting of stress related to 2 deaths in her family.  An echocardiogram was repeated in May which showed an EF of 45 to 50% with grade 1 diastolic dysfunction, moderate mitral regurgitation and mild to moderate tricuspid regurgitation.  Pulmonary pressure was normal.  She had a Lexiscan Myoview in October 2021 which showed an EF of 45 to 54% with no ischemia.  She was most recently seen by Laurann Montana in November.  Some of her chest pain was felt to be GI in nature and she was started on Pepcid.  In addition, she was volume overloaded and the dose of furosemide was increased.  She was diagnosed with COVID-19 infection in November and received remdesivir.  She was on Orencia for rheumatoid arthritis but it was discontinued in November due to symptoms of shortness of breath and fluid retention.  Her symptoms improved after she stopped the medication.  She  has been doing reasonably well with no recent chest pain or worsening dyspnea.  She has stable bilateral leg edema.  Past Medical History:  Diagnosis Date  . Breast cancer ([place]) 1997   s/p mastectomy, right breast, chemo, BRCA negative  . Cardiomyopathy   . 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[place])    precipitated by use of Enbrel  2010  . Chronic systolic congestive heart failure ([place])   . Collagen vascular disease (HCC)    RA  . Diverticulosis of colon (without mention of hemorrhage) April 2012   occurred after colonoscopy, [place]  . Menopause    secondary to chemo for BRCA  . Rheumatoid arthritis ([place])   . Rheumatoid arthritis(714.0)    on Plaquenil methotrxate Precious Reel)    Past Surgical History:  Procedure Laterality Date  . ANKLE FUSION     right, seondary to erosive arthritis  . BREAST BIOPSY Right 1997   positive for breast ca  . BREAST SURGERY Right 1997   Lumpectomy  . CARDIAC CATHETERIZATION  2010   normal coronary arteries  . COLONOSCOPY WITH PROPOFOL N/A 03/26/2016   Procedure: COLONOSCOPY WITH PROPOFOL;  Surgeon: Lollie Sails, MD;  Location: [facility] ENDOSCOPY;  Service: Endoscopy;  Laterality: N/A;  . FOOT SURGERY  2004  . MASTECTOMY Right 1997  . rheumatology histroy       Current Outpatient Medications  Medication Sig Dispense Refill  . Calcium 150 MG TABS Take by mouth daily.    Marland Kitchen  carvedilol (COREG) 6.25 MG tablet TAKE 1 TABLET BY MOUTH TWICE A DAY 180 tablet 1  . cholecalciferol (VITAMIN D) 1000 UNITS tablet Take 2,000 Units by mouth daily.     . famotidine (PEPCID) 20 MG tablet Take 1 tablet (20 mg total) by mouth 2 (two) times daily. Please request further refills from your primary care provider, or can purchase over-the-counter. 60 tablet 0  . folic acid (FOLVITE) 1 MG tablet Take 1 mg by mouth daily.    . furosemide (LASIX) 20 MG tablet TAKE 1 TABLET BY MOUTH TWICE A DAY 180 tablet 1  . hydroxychloroquine (PLAQUENIL) 200 MG tablet Take one  and one-half tablet daily.    Marland Kitchen lisinopril (ZESTRIL) 10 MG tablet TAKE 1 TABLET BY MOUTH EVERY DAY 90 tablet 3  . methotrexate 50 MG/2ML injection INJECT 1ML SUBCUTANEOUSLY ONCE A WEEK; takes 25 mg/mL    . spironolactone (ALDACTONE) 25 MG tablet TAKE 1 TABLET BY MOUTH EVERY DAY 30 tablet 0  . valACYclovir (VALTREX) 500 MG tablet Take 500 mg by mouth daily.     No current facility-administered medications for this visit.    Allergies:   Amoxicillin, Sulfasalazine, and Latex    Social History:  The patient  reports that she has never smoked. She has never used smokeless tobacco. She reports current alcohol use. She reports that she does not use drugs.   Family History:  The patient's family history includes Breast cancer in her paternal aunt; Cancer in her mother and paternal aunt; Heart disease in her father and maternal grandmother.    ROS:  Please see the history of present illness.   Otherwise, review of systems are positive for none.   All other systems are reviewed and negative.    PHYSICAL EXAM: VS:  BP 110/60 (BP Location: Left Arm, Patient Position: Sitting, Cuff Size: Normal)   Ht $R'5\' 2"'aV$  (1.575 m)   Wt 150 lb (68 kg)   SpO2 99%   BMI 27.44 kg/m  , BMI Body mass index is 27.44 kg/m. GEN: Well nourished, well developed, in no acute distress  HEENT: normal  Neck: no JVD, carotid bruits, or masses Cardiac: RRR; no murmurs, rubs, or gallops, trace bilateral leg edema Respiratory:  clear to auscultation bilaterally, normal work of breathing GI: soft, nontender, nondistended, + BS MS: no deformity or atrophy  Skin: warm and dry, no rash Neuro:  Strength and sensation are intact Psych: euthymic mood, full affect   EKG:  EKG is ordered today. The ekg ordered today demonstrates normal sinus rhythm with low voltage.   Recent Labs: No results found for requested labs within last 8760 hours.    Lipid Panel    Component Value Date/Time   CHOL 165 11/02/2016 1520   TRIG  110.0 11/02/2016 1520   TRIG 82 02/04/2010 0000   HDL 55.80 11/02/2016 1520   CHOLHDL 3 11/02/2016 1520   VLDL 22.0 11/02/2016 1520   LDLCALC 87 11/02/2016 1520   LDLDIRECT 87.0 11/02/2016 1520      Wt Readings from Last 3 Encounters:  01/01/21 150 lb (68 kg)  07/01/20 148 lb 6 oz (67.3 kg)  06/19/20 147 lb (66.7 kg)        ASSESSMENT AND PLAN:  1.  Chronic systolic heart failure: Due to chemotherapy-induced cardiomyopathy. Most recent ejection fraction was 45 to 50%.  I am hesitant to switch ACE inhibitor to [facility] given intermittent hypotension.  Continue carvedilol, lisinopril and spironolactone.  Her labs in March were unremarkable  with normal renal function. She appears to be euvolemic on current dose of furosemide 20 mg twice daily.  I elected to add Farxiga 10 mg once daily and decrease furosemide to once daily.  Check basic metabolic profile in 1 week.  2. Rheumatoid arthritis: Orencia was stopped due to volume overload.  She is currently on Plaquenil and methotrexate.  3.  mitral regurgitation: Moderate on most recent echocardiogram.   Disposition:   FU with me in 6 months.  Signed,  Kathlyn Sacramento, MD  01/01/2021 10:44 AM    [place]

## 2021-01-01 NOTE — Patient Instructions (Signed)
Medication Instructions:  Your physician has recommended you make the following change in your medication:   REDUCE Lasix to 20 mg daily  START Farxiga 10 mg daily. An Rx has been sent to your pharmacy.   *If you need a refill on your cardiac medications before your next appointment, please call your pharmacy*   Lab Work: Your physician recommends that you return for lab work (bmp) in: 1 week  Please have your lab drawn at the [facility]. You do not need an appt. Lab hours are Mon-Fri 7:30am-5:30pm.   If you have labs (blood work) drawn today and your tests are completely normal, you will receive your results only by: Marland Kitchen MyChart Message (if you have MyChart) OR . A paper copy in the mail If you have any lab test that is abnormal or we need to change your treatment, we will call you to review the results.   Testing/Procedures: None ordered   Follow-Up: At [facility], you and your health needs are our priority.  As part of our continuing mission to provide you with exceptional heart care, we have created designated Provider Care Teams.  These Care Teams include your primary Cardiologist (physician) and Advanced Practice Providers (APPs -  Physician Assistants and Nurse Practitioners) who all work together to provide you with the care you need, when you need it.  We recommend signing up for the patient portal called "MyChart".  Sign up information is provided on this After Visit Summary.  MyChart is used to connect with patients for Virtual Visits (Telemedicine).  Patients are able to view lab/test results, encounter notes, upcoming appointments, etc.  Non-urgent messages can be sent to your provider as well.   To learn more about what you can do with MyChart, go to [email or web address].    Your next appointment:   Your physician wants you to follow-up in: 6 months You will receive a reminder letter in the mail two months in advance. If you don't receive a letter, please  call our office to schedule the follow-up appointment.   The format for your next appointment:   In Person  Provider:   You may see Kathlyn Sacramento, MD or one of the following Advanced Practice Providers on your designated Care Team:    Murray Hodgkins, NP  Christell Faith, PA-C  Marrianne Mood, PA-C  Cadence Pescadero, [place]  Laurann Montana, NP    Other Instructions N/A

## 2021-01-08 NOTE — Telephone Encounter (Signed)
PA Case: [id number], Status: Approved, Coverage Starts on: 01/08/2021 12:00:00 AM, Coverage Ends on: 08/29/2021 12:00:00 AM.

## 2021-01-08 NOTE — Telephone Encounter (Signed)
This request has received a favorable outcome and is approved. Please note any additional information provided by [employer] at the bottom of your screen. You will also receive a faxed copy of the determination. If you have any questions please contact [employer] at [phone].  [facility]ed [facility]) to inform them of approval.

## 2021-01-08 NOTE — Telephone Encounter (Signed)
Prior authorization for Wilder Glade has been completed in [email or web address] however will most likely be denied because it appears that Jardiance is preferred per plan. KEYElijio Miles ID# [id number] PCN: [id number] RxGRP: [id number] BIN: [id number]  [employer] has received your information, and the request will be reviewed. You may close this dialog, return to your dashboard, and perform other tasks.  You will receive an electronic determination in CoverMyMeds. You can see the latest determination by locating this request on your dashboard or by reopening this request. You will also receive a faxed copy of the determination. If you have any questions please contact [employer] at [phone].

## 2021-01-12 ENCOUNTER — Telehealth: Payer: Self-pay

## 2021-01-12 NOTE — Telephone Encounter (Signed)
Please explain to the insurance company that we are not using this for diabetes.  We are using this medication for congestive heart failure and there is no other alternative except Jardiance.  Is that covered?  They have to approve at least one of them.

## 2021-01-12 NOTE — Telephone Encounter (Signed)
Coverage determination request form for Tier Exception has been completed and faxed to [employer] at [phone] on 01-09-2021. Confirmation received.   Per Lenna Sciara at [employer], the tier exception requested by the patient for [place] 10mg  tablets has been denied. "This medication is not eligible for a tier exception at lower cost because it is not on the plan's formulary therefore it does not qualify." Appeal has been initiated and the [place] Department will contact Dr. Fletcher Anon to further discuss this decision.

## 2021-01-12 NOTE — Telephone Encounter (Signed)
Spoke with Benjaman Lobe. At [employer] to see whether [name] would be a cheaper option.   Wilder Glade has been approved through prior authorization but will be a Tier 4 medication. The patient has a $480 deductible to meet on her Medicare plan. Wilder Glade will cost $517.61 for a 30 day supply.  [name] is on the patient's plan as a Tier 3 medication and does not require prior authorization. Due to her deductible, the first month's supply will cost $480 but will decrease to a $43 copay thereafter unless she reaches the donut hole.

## 2021-01-12 NOTE — Telephone Encounter (Addendum)
The CHF indication was given not diabetes to the patients insurance as part of the prior auth and tier exception consideration for [place].  Will await the appeal decision.

## 2021-01-12 NOTE — Telephone Encounter (Signed)
They have to approve the either [place] or Jardiance.  Otherwise, they are depriving the patient from a lifesaving treatment option.

## 2021-01-15 ENCOUNTER — Other Ambulatory Visit: Payer: Self-pay | Admitting: Cardiovascular Disease

## 2021-01-15 NOTE — Telephone Encounter (Signed)
I am okay with Jardiance 10 mg daily if the patient can afford the medication better.

## 2021-01-16 MED ORDER — EMPAGLIFLOZIN 10 MG PO TABS: 10.0000 mg | ORAL_TABLET | Freq: Every day | ORAL | Status: DC

## 2021-01-16 NOTE — Telephone Encounter (Signed)
Spoke with the patient. Patient made aware that the redetermination for Helen Parks has been denied by her insurance. Jardiance would be on a lower tier and more cost effective.  Adv the patient that Dr. Fletcher Anon is ok with the alternative of Jardiance 10 mg daily. Patient sts that she will be able to get a 30 day supply of Farxiga at a low price. She will complete the 30 day supply of Farxiga and call back to provide the mail order pharmacy info for Jardiance to be sent in.  Reminded the patient that after 1 week of being on the medication she will need to have a bmp drawn at the [facility] medical mall.  Patient verbalized understanding and voiced appreciation for the assistance.

## 2021-01-16 NOTE — Telephone Encounter (Signed)
Redetermination for Helen Parks was denied by the patients insurance. Dr. Fletcher Anon recommends Jardiance 10 mg daily as an alternative if affordable for the patient. Called the patient to discuss. lmtcb.

## 2021-01-16 NOTE — Addendum Note (Signed)
Addended by: Lamar Laundry on: 01/16/2021 12:33 PM   Modules accepted: Orders

## 2021-01-16 NOTE — Telephone Encounter (Signed)
Patient is returning your call.  

## 2021-01-19 MED ORDER — EMPAGLIFLOZIN 10 MG PO TABS: 10.0000 mg | ORAL_TABLET | Freq: Every day | ORAL | 5 refills | Status: DC

## 2021-01-19 NOTE — Telephone Encounter (Signed)
Returned the patients call. Helen Parks. Rx for Jardiance sent to the patients pharmacy requested ([facility] Patient is to contact the office if any questions.

## 2021-01-19 NOTE — Telephone Encounter (Signed)
Patient calling to check on status of [facility] being [facility] to [facility] [place]

## 2021-01-19 NOTE — Addendum Note (Signed)
Addended by: Lamar Laundry on: 01/19/2021 01:58 PM   Modules accepted: Orders

## 2021-01-30 ENCOUNTER — Other Ambulatory Visit
Admission: RE | Admit: 2021-01-30 | Discharge: 2021-01-30 | Disposition: A | Discharge status: Home / Self Care | Payer: Medicare Other | Attending: Cardiovascular Disease | Admitting: Cardiovascular Disease

## 2021-01-30 ENCOUNTER — Other Ambulatory Visit: Payer: Self-pay

## 2021-01-30 DIAGNOSIS — I5022 Chronic systolic (congestive) heart failure: Secondary | ICD-10-CM | POA: Insufficient documentation

## 2021-01-30 LAB — BASIC METABOLIC PANEL
Anion gap: 9 (ref 5–15)
BUN: 19 mg/dL (ref 8–23)
CO2: 27 mmol/L (ref 22–32)
Calcium: 8.9 mg/dL (ref 8.9–10.3)
Chloride: 100 mmol/L (ref 98–111)
Creatinine, Ser: 0.8 mg/dL (ref 0.44–1.00)
GFR, Estimated: >60 mL/min (ref >60)
Glucose, Bld: 87 mg/dL (ref 70–99)
Potassium: 4.3 mmol/L (ref 3.5–5.1)
Sodium: 136 mmol/L (ref 135–145)

## 2021-02-06 ENCOUNTER — Other Ambulatory Visit: Payer: Self-pay | Admitting: *Deleted

## 2021-02-06 MED ORDER — EMPAGLIFLOZIN 10 MG PO TABS: 10.0000 mg | ORAL_TABLET | Freq: Every day | ORAL | 2 refills | Status: DC

## 2021-02-12 ENCOUNTER — Telehealth: Payer: Self-pay | Admitting: Cardiovascular Disease

## 2021-02-12 ENCOUNTER — Telehealth: Payer: Self-pay

## 2021-02-12 NOTE — Telephone Encounter (Signed)
Called patient to let her know she can pick up the samples of Jardiance she requested from the front office. Patient asked whether or not Jardiance can cause fatigue. I told her that it is not a typical side effect however I would let Dr. Fletcher Anon know how she is feeling. She states by mid afternoon she "feels like her energy is zapped". She would also like for him to know that "the fluid is much better" since starting the Jardiance. Patient would like to know if he feels fatigue could be caused from this medication.

## 2021-02-12 NOTE — Telephone Encounter (Signed)
Patient calling the office for samples of medication:   1.  What medication and dosage are you requesting samples for? Jardiance 10 mg daily   2.  Are you currently out of this medication? Yes, waiting on mail order delivery. Patient needs 7-10 days worth  Please advise

## 2021-02-12 NOTE — Telephone Encounter (Signed)
Spoke with patient and advised her that she may pick samples up from the front office. Patient states she will pick them up tomorrow.  Medication Samples have been provided to the patient.  Drug name: Jardiance       Strength: 10mg         Qty: 14  LOT: [id number]  Exp.Date: 09/23  Horton Finer 10:42 AM 02/12/2021

## 2021-02-13 NOTE — Telephone Encounter (Signed)
Called patient and left a VM per DPR on file regarding the following result note: Helen Hampshire, MD  You 3 hours ago (12:59 PM)   I am not aware that [name] can cause fatigue   Encouraged patient to call back if she has any questions or concerns.

## 2021-02-23 ENCOUNTER — Telehealth (INDEPENDENT_AMBULATORY_CARE_PROVIDER_SITE_OTHER): Payer: Medicare Other | Admitting: Gastroenterology

## 2021-02-23 DIAGNOSIS — Z86010 Personal history of colonic polyps: Secondary | ICD-10-CM

## 2021-02-23 MED ORDER — NA SULFATE-K SULFATE-MG SULF 17.5-3.13-1.6 GM/177ML PO SOLN: 1.0000 | Freq: Once | ORAL | 0 refills | Status: AC

## 2021-02-23 NOTE — Progress Notes (Signed)
Gastroenterology Pre-Procedure Review  Request Date: 03/03/21 Requesting Physician: Dr. Allen Norris  PATIENT REVIEW QUESTIONS: The patient responded to the following health history questions as indicated:    1. Are you having any GI issues? no 2. Do you have a personal history of Polyps? yes (03/26/2016) 3. Do you have a family history of Colon Cancer or Polyps? no 4. Diabetes Mellitus? no 5. Joint replacements in the past 12 months?no 6. Major health problems in the past 3 months?no 7. Any artificial heart valves, MVP, or defibrillator?no    MEDICATIONS & ALLERGIES:    Patient reports the following regarding taking any anticoagulation/antiplatelet therapy:   Plavix, Coumadin, Eliquis, Xarelto, Lovenox, Pradaxa, Brilinta, or Effient? no Aspirin? no  Patient confirms/reports the following medications:  Current Outpatient Medications  Medication Sig Dispense Refill   Calcium 150 MG TABS Take by mouth daily.     carvedilol (COREG) 6.25 MG tablet TAKE 1 TABLET BY MOUTH TWICE A DAY 180 tablet 1   cholecalciferol (VITAMIN D) 1000 UNITS tablet Take 2,000 Units by mouth daily.      empagliflozin (JARDIANCE) 10 MG TABS tablet Take 1 tablet (10 mg total) by mouth daily before breakfast. 90 tablet 2   famotidine (PEPCID) 20 MG tablet Take 1 tablet (20 mg total) by mouth 2 (two) times daily. Please request further refills from your primary care provider, or can purchase over-the-counter. 60 tablet 0   folic acid (FOLVITE) 1 MG tablet Take 1 mg by mouth daily.     furosemide (LASIX) 20 MG tablet Take 1 tablet (20 mg total) by mouth daily. 90 tablet 1   hydroxychloroquine (PLAQUENIL) 200 MG tablet Take one and one-half tablet daily.     lisinopril (ZESTRIL) 10 MG tablet TAKE 1 TABLET BY MOUTH EVERY DAY 90 tablet 3   methotrexate 50 MG/2ML injection INJECT 1ML SUBCUTANEOUSLY ONCE A WEEK; takes 25 mg/mL     spironolactone (ALDACTONE) 25 MG tablet TAKE 1 TABLET BY MOUTH EVERY DAY 30 tablet 5   valACYclovir  (VALTREX) 500 MG tablet Take 500 mg by mouth daily.     No current facility-administered medications for this visit.    Patient confirms/reports the following allergies:  Allergies  Allergen Reactions   Amoxicillin     REACTION: upset stomach   Sulfasalazine Other (See Comments)    Other reaction(s): Other (See Comments) GI upset   Latex Rash    No orders of the defined types were placed in this encounter.   AUTHORIZATION INFORMATION Primary Insurance: 1D#: Group #:  Secondary Insurance: 1D#: Group #:  SCHEDULE INFORMATION: Date: 03/03/21 Time: Location: [place]

## 2021-02-24 ENCOUNTER — Telehealth: Payer: Self-pay

## 2021-02-24 NOTE — Telephone Encounter (Signed)
Returned pt's call regarding cancelling colonoscopy. Pt stated she needs an EGD due to non cardiac chest pain and reflux. Pt has been scheduled with Dr. Allen Norris in August. EGD has been added to procedure.

## 2021-02-24 NOTE — Telephone Encounter (Signed)
Patient states she would like to cancel her colonoscopy for July 5 because she wants to have a EGD also. She is on Pepcid because she was having chest pain

## 2021-02-27 ENCOUNTER — Telehealth: Payer: Self-pay | Admitting: Gastroenterology

## 2021-02-27 NOTE — Telephone Encounter (Signed)
Patient asks for call back to confirm that she is having both EGD /Colonoscopy on Tuesday.

## 2021-02-27 NOTE — Telephone Encounter (Signed)
Returned pt back and advised EGD was added to her procedure for Tuesday, July 5th.

## 2021-03-03 ENCOUNTER — Encounter
Admission: RE | Disposition: A | Discharge status: Home / Self Care | Payer: Self-pay | Source: Home / Self Care | Attending: Gastroenterology

## 2021-03-03 ENCOUNTER — Ambulatory Visit
Admission: RE | Admit: 2021-03-03 | Discharge: 2021-03-03 | Disposition: A | Discharge status: Home / Self Care | Payer: Medicare Other | Attending: Gastroenterology | Admitting: Gastroenterology

## 2021-03-03 ENCOUNTER — Encounter: Payer: Self-pay | Admitting: Gastroenterology

## 2021-03-03 ENCOUNTER — Other Ambulatory Visit: Payer: Self-pay

## 2021-03-03 ENCOUNTER — Ambulatory Visit: Payer: Medicare Other | Admitting: Anesthesiology

## 2021-03-03 DIAGNOSIS — R12 Heartburn: Secondary | ICD-10-CM | POA: Diagnosis present

## 2021-03-03 DIAGNOSIS — Z86010 Personal history of colonic polyps: Secondary | ICD-10-CM | POA: Insufficient documentation

## 2021-03-03 DIAGNOSIS — K640 First degree hemorrhoids: Secondary | ICD-10-CM | POA: Insufficient documentation

## 2021-03-03 DIAGNOSIS — Z803 Family history of malignant neoplasm of breast: Secondary | ICD-10-CM | POA: Insufficient documentation

## 2021-03-03 DIAGNOSIS — I5022 Chronic systolic (congestive) heart failure: Secondary | ICD-10-CM | POA: Diagnosis not present

## 2021-03-03 DIAGNOSIS — Z806 Family history of leukemia: Secondary | ICD-10-CM | POA: Insufficient documentation

## 2021-03-03 DIAGNOSIS — Z8041 Family history of malignant neoplasm of ovary: Secondary | ICD-10-CM | POA: Diagnosis not present

## 2021-03-03 DIAGNOSIS — K449 Diaphragmatic hernia without obstruction or gangrene: Secondary | ICD-10-CM | POA: Diagnosis not present

## 2021-03-03 DIAGNOSIS — K5730 Diverticulosis of large intestine without perforation or abscess without bleeding: Secondary | ICD-10-CM | POA: Insufficient documentation

## 2021-03-03 DIAGNOSIS — Z853 Personal history of malignant neoplasm of breast: Secondary | ICD-10-CM | POA: Diagnosis not present

## 2021-03-03 DIAGNOSIS — Z8249 Family history of ischemic heart disease and other diseases of the circulatory system: Secondary | ICD-10-CM | POA: Insufficient documentation

## 2021-03-03 DIAGNOSIS — Z91040 Latex allergy status: Secondary | ICD-10-CM | POA: Insufficient documentation

## 2021-03-03 DIAGNOSIS — K2100 Gastro-esophageal reflux disease with esophagitis, without bleeding: Secondary | ICD-10-CM

## 2021-03-03 DIAGNOSIS — Z880 Allergy status to penicillin: Secondary | ICD-10-CM | POA: Diagnosis not present

## 2021-03-03 DIAGNOSIS — Z882 Allergy status to sulfonamides status: Secondary | ICD-10-CM | POA: Diagnosis not present

## 2021-03-03 DIAGNOSIS — Z1211 Encounter for screening for malignant neoplasm of colon: Secondary | ICD-10-CM | POA: Diagnosis not present

## 2021-03-03 DIAGNOSIS — Z79899 Other long term (current) drug therapy: Secondary | ICD-10-CM | POA: Diagnosis not present

## 2021-03-03 DIAGNOSIS — M069 Rheumatoid arthritis, unspecified: Secondary | ICD-10-CM | POA: Insufficient documentation

## 2021-03-03 HISTORY — PX: ESOPHAGOGASTRODUODENOSCOPY: SHX5428

## 2021-03-03 HISTORY — PX: COLONOSCOPY WITH PROPOFOL: SHX5780

## 2021-03-03 SURGERY — COLONOSCOPY WITH PROPOFOL
Anesthesia: General

## 2021-03-03 MED ORDER — PANTOPRAZOLE SODIUM 40 MG PO TBEC: 40.0000 mg | DELAYED_RELEASE_TABLET | Freq: Every day | ORAL | 10 refills | Status: DC

## 2021-03-03 MED ORDER — LIDOCAINE HCL (CARDIAC) PF 100 MG/5ML IV SOSY
PREFILLED_SYRINGE | INTRAVENOUS | Status: DC | PRN
  Administered 2021-03-03: 60 mg via INTRAVENOUS

## 2021-03-03 MED ORDER — PROPOFOL 10 MG/ML IV BOLUS
INTRAVENOUS | Status: DC | PRN
  Administered 2021-03-03: 80 mg via INTRAVENOUS

## 2021-03-03 MED ORDER — SODIUM CHLORIDE 0.9 % IV SOLN: INTRAVENOUS | Status: DC

## 2021-03-03 MED ORDER — PROPOFOL 500 MG/50ML IV EMUL
INTRAVENOUS | Status: DC | PRN
  Administered 2021-03-03: 140 ug/kg/min via INTRAVENOUS

## 2021-03-03 MED ORDER — PROPOFOL 10 MG/ML IV BOLUS
INTRAVENOUS | Status: AC
  Filled 2021-03-03: qty 20

## 2021-03-03 NOTE — Transfer of Care (Signed)
Immediate Anesthesia Transfer of Care Note  Patient: Helen Parks  Procedure(s) Performed: COLONOSCOPY WITH PROPOFOL ESOPHAGOGASTRODUODENOSCOPY (EGD)  Patient Location: PACU  Anesthesia Type:General  Level of Consciousness: sedated  Airway & Oxygen Therapy: Patient Spontanous Breathing and Patient connected to nasal cannula oxygen  Post-op Assessment: Report given to RN and Post -op Vital signs reviewed and stable  Post vital signs: Reviewed and stable  Last Vitals:  Vitals Value Taken Time  BP 102/48 03/03/21 0938  Temp    Pulse 85 03/03/21 0939  Resp 18 03/03/21 0939  SpO2 97 % 03/03/21 0939  Vitals shown include unvalidated device data.  Last Pain:  Vitals:   03/03/21 0930  TempSrc: Temporal  PainSc:          Complications: No notable events documented.

## 2021-03-03 NOTE — Anesthesia Preprocedure Evaluation (Addendum)
Anesthesia Evaluation  Patient identified by MRN, date of birth, ID band Patient awake    Reviewed: Allergy & Precautions, NPO status , Patient's Chart, lab work & pertinent test results  History of Anesthesia Complications Negative for: history of anesthetic complications  Airway Mallampati: II  TM Distance: >3 FB Neck ROM: Full    Dental no notable dental hx.    Pulmonary neg pulmonary ROS, neg sleep apnea, neg COPD,    breath sounds clear to auscultation- rhonchi (-) wheezing      Cardiovascular Exercise Tolerance: Good (-) hypertension+CHF  (-) CAD, (-) Past MI, (-) Cardiac Stents and (-) CABG  Rhythm:Regular Rate:Normal - Systolic murmurs and - Diastolic murmurs NM stress test 06/23/20: There was no ST segment deviation noted during stress. No T wave inversion was noted during stress. The left ventricular ejection fraction is mildly decreased (45-54%). This is an intermediate risk study mainly due to reduced ejection fraction. No evidence of perfusion defects on stress images. Rest images are suboptimal due to intense GI uptake interfering with the inferior border of the heart. Overall, no evidence of ischemia or prior infarct. Attenuation correction CT images showed no evidence of aortic or coronary calcifications.  Echo 01/25/20: 1. Left ventricular ejection fraction, by estimation, is 45 to 50%. The  left ventricle has mildly decreased function. The left ventricle  demonstrates global hypokinesis. Left ventricular diastolic parameters are  consistent with Grade I diastolic  dysfunction (impaired relaxation).  2. Right ventricular systolic function is normal. The right ventricular  size is normal. There is normal pulmonary artery systolic pressure. The  estimated right ventricular systolic pressure is 41.9 mmHg.  3. Left atrial size was mildly dilated.  4. Moderate mitral valve regurgitation.  5. Tricuspid valve  regurgitation is mild to moderate.    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[facility])     Comment:  precipitated by use of Enbrel  2010 No date: Chronic systolic congestive heart failure ([place]) No date: Collagen vascular disease ([place])     Comment:  RA April 2012: Diverticulosis of colon (without mention of hemorrhage)     Comment:  occurred after colonoscopy, [place] No date: Menopause     Comment:  secondary to chemo for BRCA No date: Rheumatoid arthritis ([place]) No date: Rheumatoid arthritis(714.0)     Comment:  on Plaquenil methotrxate [facility])   Reproductive/Obstetrics                            Anesthesia Physical Anesthesia Plan  ASA: 3  Anesthesia Plan: General   Post-op Pain Management:    Induction: Intravenous  PONV Risk Score and Plan: 2 and Propofol infusion  Airway Management Planned: Natural Airway  Additional Equipment:   Intra-op Plan:   Post-operative Plan:   Informed Consent: I have reviewed the patients History and Physical, chart, labs and discussed the procedure including the risks, benefits and alternatives for the proposed anesthesia with the patient or authorized representative who has  indicated his/her understanding and acceptance.     Dental advisory given  Plan Discussed with: CRNA and Anesthesiologist  Anesthesia Plan Comments:         Anesthesia Quick Evaluation

## 2021-03-03 NOTE — Op Note (Signed)
[facility] Gastroenterology Patient Name: Helen Parks Procedure Date: 03/03/2021 9:17 AM MRN: [id number] Account #: [id number] Date of Birth: 11-21-1950 Admit Type: Outpatient Age: 70 Room: [facility] ENDO ROOM 4 Gender: Female Note Status: Finalized Procedure:             Upper GI endoscopy Indications:           Heartburn Providers:             Lucilla Lame MD, MD Referring MD:          Leonie Douglas. Doy Hutching, MD (Referring MD) Medicines:             Propofol per Anesthesia Complications:         No immediate complications. Procedure:             Pre-Anesthesia Assessment:                        - Prior to the procedure, a History and Physical was                         performed, and patient medications and allergies were                         reviewed. The patient's tolerance of previous                         anesthesia was also reviewed. The risks and benefits                         of the procedure and the sedation options and risks                         were discussed with the patient. All questions were                         answered, and informed consent was obtained. Prior                         Anticoagulants: The patient has taken no previous                         anticoagulant or antiplatelet agents. ASA Grade                         Assessment: II - A patient with mild systemic disease.                         After reviewing the risks and benefits, the patient                         was deemed in satisfactory condition to undergo the                         procedure.                        After obtaining informed consent, the endoscope was  passed under direct vision. Throughout the procedure,                         the patient's blood pressure, pulse, and oxygen                         saturations were monitored continuously. The Endoscope                         was introduced through the mouth, and advanced to the                          second part of duodenum. The upper GI endoscopy was                         accomplished without difficulty. The patient tolerated                         the procedure well. Findings:      A small hiatal hernia was present.      LA Grade A (one or more mucosal breaks less than 5 mm, not extending       between tops of 2 mucosal folds) esophagitis with no bleeding was found       at the gastroesophageal junction.      The stomach was normal.      The examined duodenum was normal. Impression:            - Small hiatal hernia.                        - LA Grade A reflux esophagitis with no bleeding.                        - Normal stomach.                        - Normal examined duodenum.                        - No specimens collected. Recommendation:        - Discharge patient to home.                        - Resume previous diet.                        - Continue present medications.                        - Use a proton pump inhibitor PO daily. Procedure Code(s):     --- Professional ---                        [phone], Esophagogastroduodenoscopy, flexible,                         transoral; diagnostic, including collection of                         specimen(s) by brushing or washing, when performed                         (  separate procedure) Diagnosis Code(s):     --- Professional ---                        R12, Heartburn                        K21.00, Gastro-esophageal reflux disease with                         esophagitis, without bleeding CPT copyright 2019 American Medical Association. All rights reserved. The codes documented in this report are preliminary and upon coder review may  be revised to meet current compliance requirements. Lucilla Lame MD, MD 03/03/2021 9:37:22 AM This report has been signed electronically. Number of Addenda: 0 Note Initiated On: 03/03/2021 9:17 AM Total Procedure Duration: 0 hours 0 minutes 55 seconds  Estimated Blood Loss:   Estimated blood loss: none.      [facility]

## 2021-03-03 NOTE — H&P (Addendum)
Lucilla Lame, MD [address] [place], [place] [address] Phone:[phone] Fax : [phone]  Primary Care Physician:  Idelle Crouch, MD Primary Gastroenterologist:  Dr. Allen Norris  Pre-Procedure History & Physical: HPI:  Helen Parks is a 70 y.o. female is here for an colonoscopy and upper endoscopy.   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[place])    precipitated by use of Enbrel  1540   Chronic systolic congestive heart failure ([facility])    Collagen vascular disease ([place])    RA   Diverticulosis of colon (without mention of hemorrhage) April 2012   occurred after colonoscopy, [place]   Menopause    secondary to chemo for BRCA   Rheumatoid arthritis ([place])    Rheumatoid arthritis(714.0)    on Plaquenil methotrxate Precious Reel)    Past Surgical History:  Procedure Laterality Date   ANKLE FUSION     right, seondary to erosive arthritis   BREAST BIOPSY Right 1997   positive for breast ca   BREAST SURGERY Right 1997   Lumpectomy   CARDIAC CATHETERIZATION  2010   normal coronary arteries   COLONOSCOPY WITH PROPOFOL N/A 03/26/2016   Procedure: COLONOSCOPY WITH PROPOFOL;  Surgeon: Lollie Sails, MD;  Location: [facility] ENDOSCOPY;  Service: Endoscopy;  Laterality: N/A;   FOOT SURGERY  2004   MASTECTOMY Right 1997   rheumatology histroy      Prior to Admission medications   Medication Sig Start Date End Date Taking? Authorizing Provider  Calcium 150 MG TABS Take by mouth daily.   Yes [provider]  carvedilol (COREG) 6.25 MG tablet TAKE 1 TABLET BY MOUTH TWICE A DAY 10/21/20  Yes Wellington Hampshire, MD  cholecalciferol (VITAMIN D) 1000 UNITS tablet Take 2,000 Units by mouth daily.    Yes [provider]  empagliflozin (JARDIANCE) 10 MG TABS tablet Take 1 tablet (10 mg total) by mouth daily before breakfast.  02/06/21  Yes Wellington Hampshire, MD  famotidine (PEPCID) 20 MG tablet Take 1 tablet (20 mg total) by mouth 2 (two) times daily. Please request further refills from your primary care provider, or can purchase over-the-counter. 11/11/20  Yes Loel Dubonnet, NP  folic acid (FOLVITE) 1 MG tablet Take 1 mg by mouth daily.   Yes [provider]  furosemide (LASIX) 20 MG tablet Take 1 tablet (20 mg total) by mouth daily. 01/01/21  Yes Wellington Hampshire, MD  hydroxychloroquine (PLAQUENIL) 200 MG tablet Take one and one-half tablet daily.   Yes [provider]  lisinopril (ZESTRIL) 10 MG tablet TAKE 1 TABLET BY MOUTH EVERY DAY 05/12/20  Yes Wellington Hampshire, MD  spironolactone (ALDACTONE) 25 MG tablet TAKE 1 TABLET BY MOUTH EVERY DAY 01/15/21  Yes Wellington Hampshire, MD  valACYclovir (VALTREX) 500 MG tablet Take 500 mg by mouth daily. 12/30/20  Yes [provider]  methotrexate 50 MG/2ML injection INJECT 1ML SUBCUTANEOUSLY ONCE A WEEK; takes 25 mg/mL 10/10/15   [provider]    Allergies as of 02/23/2021 - Review Complete 01/01/2021  Allergen Reaction Noted   Amoxicillin     Sulfasalazine Other (See Comments) 10/15/2014   Latex Rash 09/10/2016    Family History  Problem Relation Age of Onset   Heart disease Father    Cancer Mother        leukemia  Cancer Paternal Aunt        ovarian   Breast cancer Paternal Aunt    Heart disease Maternal Grandmother     Social History   Socioeconomic History   Marital status: Married    Spouse name: Not on file   Number of children: Not on file   Years of education: Not on file   Highest education level: Not on file  Occupational History   Occupation: Copywriter, advertising    Comment: full time  Tobacco Use   Smoking status: Never   Smokeless tobacco: Never   Tobacco comments:    tobacco use- no  Vaping Use   Vaping Use: Never used  Substance and Sexual Activity   Alcohol use: Yes    Comment: social   Drug use: No    Sexual activity: Not on file  Other Topics Concern   Not on file  Social History Narrative   Lives with spouse.   Social Determinants of Health   Financial Resource Strain: Not on file  Food Insecurity: Not on file  Transportation Needs: Not on file  Physical Activity: Not on file  Stress: Not on file  Social Connections: Not on file  Intimate Partner Violence: Not on file    Review of Systems: See HPI, otherwise negative ROS  Physical Exam: BP 121/73   Pulse 88   Temp (!) 96.8 F (36 C) (Temporal)   Resp 16   Ht 5' 2" (1.575 m)   Wt 65.8 kg   SpO2 100%   BMI 26.52 kg/m  General:   Alert,  pleasant and cooperative in NAD Head:  Normocephalic and atraumatic. Neck:  Supple; no masses or thyromegaly. Lungs:  Clear throughout to auscultation.    Heart:  Regular rate and rhythm. Abdomen:  Soft, nontender and nondistended. Normal bowel sounds, without guarding, and without rebound.   Neurologic:  Alert and  oriented x4;  grossly normal neurologically.  Impression/Plan: Helen Parks is here for an colonoscopy to be performed for a history of adenomatous polyps on 2017 nad upper endoscopy for GERD   Risks, benefits, limitations, and alternatives regarding  colonoscopy have been reviewed with the patient.  Questions have been answered.  All parties agreeable.   Lucilla Lame, MD  03/03/2021, 8:42 AM

## 2021-03-03 NOTE — Anesthesia Postprocedure Evaluation (Signed)
Anesthesia Post Note  Patient: Helen Parks  Procedure(s) Performed: COLONOSCOPY WITH PROPOFOL ESOPHAGOGASTRODUODENOSCOPY (EGD)  Patient location during evaluation: PACU Anesthesia Type: General Level of consciousness: awake and alert Pain management: pain level controlled Vital Signs Assessment: post-procedure vital signs reviewed and stable Respiratory status: spontaneous breathing, nonlabored ventilation and respiratory function stable Cardiovascular status: blood pressure returned to baseline and stable Postop Assessment: no apparent nausea or vomiting Anesthetic complications: no   No notable events documented.   Last Vitals:  Vitals:   03/03/21 0950 03/03/21 1000  BP: 118/72 113/75  Pulse: 85 74  Resp: 17 (!) 23  Temp:    SpO2: 100% 100%    Last Pain:  Vitals:   03/03/21 0930  TempSrc: Temporal  PainSc:                  Tera Mater

## 2021-03-03 NOTE — Op Note (Signed)
[facility] Patient Name: Helen Parks Procedure Date: 03/03/2021 9:15 AM MRN: [id number] Account #: [id number] Date of Birth: 02-24-1951 Admit Type: Outpatient Age: 70 Room: [facility] ENDO ROOM 4 Gender: Female Note Status: Finalized Procedure:             Colonoscopy Indications:           High risk colon cancer surveillance: Personal history                         of colonic polyps Providers:             Lucilla Lame MD, MD Referring MD:          Leonie Douglas. Doy Hutching, MD (Referring MD) Medicines:             Propofol per Anesthesia Complications:         No immediate complications. Procedure:             Pre-Anesthesia Assessment:                        - Prior to the procedure, a History and Physical was                         performed, and patient medications and allergies were                         reviewed. The patient's tolerance of previous                         anesthesia was also reviewed. The risks and benefits                         of the procedure and the sedation options and risks                         were discussed with the patient. All questions were                         answered, and informed consent was obtained. Prior                         Anticoagulants: The patient has taken no previous                         anticoagulant or antiplatelet agents. ASA Grade                         Assessment: II - A patient with mild systemic disease.                         After reviewing the risks and benefits, the patient                         was deemed in satisfactory condition to undergo the                         procedure.  After obtaining informed consent, the colonoscope was                         passed under direct vision. Throughout the procedure,                         the patient's blood pressure, pulse, and oxygen                         saturations were monitored continuously. The                          Colonoscope was introduced through the anus and                         advanced to the the cecum, identified by appendiceal                         orifice and ileocecal valve. The colonoscopy was                         performed without difficulty. The patient tolerated                         the procedure well. The quality of the bowel                         preparation was excellent. Findings:      The perianal and digital rectal examinations were normal.      Multiple small-mouthed diverticula were found in the sigmoid colon.      Non-bleeding internal hemorrhoids were found during retroflexion. The       hemorrhoids were Grade I (internal hemorrhoids that do not prolapse). Impression:            - Diverticulosis in the sigmoid colon.                        - Non-bleeding internal hemorrhoids.                        - No specimens collected. Recommendation:        - Discharge patient to home.                        - Resume previous diet.                        - Continue present medications.                        - Repeat colonoscopy in 7 years for surveillance. Procedure Code(s):     --- Professional ---                        [phone], Colonoscopy, flexible; diagnostic, including                         collection of specimen(s) by brushing or washing, when  performed (separate procedure) Diagnosis Code(s):     --- Professional ---                        Z86.010, Personal history of colonic polyps CPT copyright 2019 American Medical Association. All rights reserved. The codes documented in this report are preliminary and upon coder review may  be revised to meet current compliance requirements. Lucilla Lame MD, MD 03/03/2021 9:35:31 AM This report has been signed electronically. Number of Addenda: 0 Note Initiated On: 03/03/2021 9:15 AM Scope Withdrawal Time: 0 hours 6 minutes 49 seconds  Total Procedure Duration: 0 hours 9 minutes 51 seconds   Estimated Blood Loss:  Estimated blood loss: none.      [facility]

## 2021-03-04 ENCOUNTER — Encounter: Payer: Self-pay | Admitting: Gastroenterology

## 2021-04-06 ENCOUNTER — Other Ambulatory Visit: Payer: Self-pay | Admitting: Internal Medicine

## 2021-04-06 DIAGNOSIS — Z1231 Encounter for screening mammogram for malignant neoplasm of breast: Secondary | ICD-10-CM

## 2021-04-13 ENCOUNTER — Ambulatory Visit: Payer: Medicare Other | Admitting: Gastroenterology

## 2021-04-17 ENCOUNTER — Telehealth: Payer: Self-pay | Admitting: Cardiovascular Disease

## 2021-04-17 MED ORDER — EMPAGLIFLOZIN 10 MG PO TABS
10.0000 mg | ORAL_TABLET | Freq: Every day | ORAL | 0 refills | Status: DC
Start: 2021-04-17 — End: 2021-10-20

## 2021-04-17 NOTE — Telephone Encounter (Signed)
Attempted to call the patient. No answer- I left a message to please call back.  

## 2021-04-17 NOTE — Telephone Encounter (Signed)
Pt c/o medication issue:  1. Name of Medication: Jardiance  2. How are you currently taking this medication (dosage and times per day)? 1 tablet daily  3. Are you having a reaction (difficulty breathing--STAT)? no  4. What is your medication issue? Caused yeast infection, what medication to take for infection

## 2021-04-17 NOTE — Telephone Encounter (Signed)
To pharm D:  For my information prior to calling the patient: Is this being a common reported side effect and if so, do you know if our physicians are treating this? What does this mean for the patient long term on this drug?

## 2021-04-17 NOTE — Telephone Encounter (Signed)
Patient called requesting samples of Jardiance as she is in the donut hole:  Samples placed at the front desk.   Jardiance 10 mg Lot: [id number] Exp: 04/2022 # 2 boxes   The patient is aware and will pick these up next week.

## 2021-04-17 NOTE — Telephone Encounter (Signed)
This is a known side effect. Would hold while yeast infection is being treated. If patient is willing to re-challenge, I would have her resume. However, if she gets another yeast infection then the med should probably be stopped.

## 2021-04-17 NOTE — Telephone Encounter (Signed)
I spoke with the patient. I have advised her of Pharm D recommendations as stated below. Per the patient, her symptoms are not too bad right now and some better than yesterday. I have advised her she may try OTC therapy (ex- Monistat)/ she may call her PCP for RX therapy to treat her symptoms of a yeast infection.  Per the patient, she really feels the [name] is helping her otherwise and she does not want to stop it. I have advised stopping this for a few days to treat her yeast infection should be fine.  She is advised to call us back if she continues on Jardiance and has recurrent yeast infections.  The patient voices understanding and is agreeable.   The patient also advised that she is in the donut hole and requesting samples of Jardiance.  Samples placed at the front desk.  Jardiance 10 mg Lot: [id number] Exp: 04/2022 # 2 boxes

## 2021-04-26 ENCOUNTER — Other Ambulatory Visit: Payer: Self-pay | Admitting: Cardiovascular Disease

## 2021-05-16 ENCOUNTER — Other Ambulatory Visit: Payer: Self-pay | Admitting: Cardiovascular Disease

## 2021-05-21 ENCOUNTER — Other Ambulatory Visit: Payer: Self-pay

## 2021-05-21 ENCOUNTER — Ambulatory Visit
Admission: RE | Admit: 2021-05-21 | Discharge: 2021-05-21 | Disposition: A | Discharge status: Home / Self Care | Payer: Medicare Other | Source: Ambulatory Visit | Attending: Internal Medicine | Admitting: Internal Medicine

## 2021-05-21 DIAGNOSIS — Z1231 Encounter for screening mammogram for malignant neoplasm of breast: Secondary | ICD-10-CM | POA: Diagnosis not present

## 2021-06-09 ENCOUNTER — Other Ambulatory Visit: Payer: Self-pay | Admitting: Cardiovascular Disease

## 2021-07-03 ENCOUNTER — Other Ambulatory Visit: Payer: Self-pay | Admitting: Cardiovascular Disease

## 2021-07-07 ENCOUNTER — Other Ambulatory Visit: Payer: Self-pay

## 2021-07-07 ENCOUNTER — Ambulatory Visit (INDEPENDENT_AMBULATORY_CARE_PROVIDER_SITE_OTHER): Payer: Medicare Other | Admitting: Cardiovascular Disease

## 2021-07-07 ENCOUNTER — Encounter: Payer: Self-pay | Admitting: Cardiovascular Disease

## 2021-07-07 VITALS — BP 100/60 | HR 82 | Ht 62.0 in | Wt 146.4 lb

## 2021-07-07 DIAGNOSIS — M069 Rheumatoid arthritis, unspecified: Secondary | ICD-10-CM

## 2021-07-07 DIAGNOSIS — I340 Nonrheumatic mitral (valve) insufficiency: Secondary | ICD-10-CM

## 2021-07-07 DIAGNOSIS — I5022 Chronic systolic (congestive) heart failure: Secondary | ICD-10-CM

## 2021-07-07 NOTE — Patient Instructions (Signed)
Medication Instructions:  Your physician recommends that you continue on your current medications as directed. Please refer to the Current Medication list given to you today.  *If you need a refill on your cardiac medications before your next appointment, please call your pharmacy*   Lab Work: None ordered  If you have labs (blood work) drawn today and your tests are completely normal, you will receive your results only by: Bernice (if you have MyChart) OR A paper copy in the mail If you have any lab test that is abnormal or we need to change your treatment, we will call you to review the results.   Testing/Procedures: None ordered   Follow-Up: At [facility], you and your health needs are our priority.  As part of our continuing mission to provide you with exceptional heart care, we have created designated Provider Care Teams.  These Care Teams include your primary Cardiologist (physician) and Advanced Practice Providers (APPs -  Physician Assistants and Nurse Practitioners) who all work together to provide you with the care you need, when you need it.  We recommend signing up for the patient portal called "MyChart".  Sign up information is provided on this After Visit Summary.  MyChart is used to connect with patients for Virtual Visits (Telemedicine).  Patients are able to view lab/test results, encounter notes, upcoming appointments, etc.  Non-urgent messages can be sent to your provider as well.   To learn more about what you can do with MyChart, go to [email or web address].    Your next appointment:   Your physician wants you to follow-up in: 6 months You will receive a reminder letter in the mail two months in advance. If you don't receive a letter, please call our office to schedule the follow-up appointment.   The format for your next appointment:   In Person  Provider:   You may see Kathlyn Sacramento, MD or one of the following Advanced Practice Providers on your  designated Care Team:   Murray Hodgkins, NP Christell Faith, PA-C Cadence Kathlen Mody, [place]    Other Instructions  Medication Samples have been provided to the patient.  Drug name: Jardiance        Strength: 10 mg         Qty: 3 bottles   LOT: [id number]   Exp.Date: May 2024  Dosing instructions: 1 tablet by mouth daily

## 2021-07-07 NOTE — Progress Notes (Signed)
Cardiology Office Note   Date:  07/07/2021   ID:  Helen Parks, DOB 02/19/1951, MRN [id number]  PCP:  Marguarite Arbour, MD  Cardiologist:   Lorine Bears, MD   Chief Complaint  Patient presents with   Other    6 month f/u no complaints today. Meds reviewed verbally with pt.      History of Present Illness: Helen Parks is a 70 y.o. 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 She had normal coronary arteries by catheterization in 2010, history of rheumatoid arthritis, breast cancer, status post mastectomy and chemotherapy in 1997  with ejection fraction noted to be 30% in 2005.  She had an episode of heart failure in 2010 which was likely triggered by the use of Enbrel for rheumatoid arthritis.  Her initial ejection fraction was 25-30% which gradually improved to 40-45% in June 2016.  Repeat echocardiogram in July 2019 showed an EF of 40 to 45% with mild to moderate mitral regurgitation.  She reported chest pain episodes in April 2021 in the setting of stress related to 2 deaths in her family.  An echocardiogram was repeated in May which showed an EF of 45 to 50% with grade 1 diastolic dysfunction, moderate mitral regurgitation and mild to moderate tricuspid regurgitation.  Pulmonary pressure was normal.  She had a Lexiscan Myoview in October 2021 which showed an EF of 45 to 54% with no ischemia.    During last visit, I added Marcelline Deist which was then changed to [facility] for better coverage.  She had significant improvement in leg edema and shortness of breath since adding the medication.  She denies chest pain or shortness of breath.  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)    precipitated by use of Enbrel  2010   Chronic systolic congestive heart failure  (HCC)    Collagen vascular disease (HCC)    RA   Diverticulosis of colon (without mention of hemorrhage) April 2012   occurred after colonoscopy, [place]   Menopause    secondary to chemo for BRCA   Rheumatoid arthritis (HCC)    Rheumatoid arthritis(714.0)    on Plaquenil methotrxate Lavenia Atlas)    Past Surgical History:  Procedure Laterality Date   ANKLE FUSION     right, seondary to erosive arthritis   BREAST BIOPSY Right 1997   positive for breast ca   BREAST SURGERY Right 1997   Lumpectomy   CARDIAC CATHETERIZATION  2010   normal coronary arteries   COLONOSCOPY WITH PROPOFOL N/A 03/26/2016   Procedure: COLONOSCOPY WITH PROPOFOL;  Surgeon: Christena Deem, MD;  Location: [facility] ENDOSCOPY;  Service: Endoscopy;  Laterality: N/A;   COLONOSCOPY WITH PROPOFOL N/A 03/03/2021   Procedure: COLONOSCOPY WITH PROPOFOL;  Surgeon: Midge Minium, MD;  Location: [facility] ENDOSCOPY;  Service: Endoscopy;  Laterality: N/A;   ESOPHAGOGASTRODUODENOSCOPY N/A 03/03/2021   Procedure: ESOPHAGOGASTRODUODENOSCOPY (EGD);  Surgeon: Midge Minium, MD;  Location: [facility] ENDOSCOPY;  Service: Endoscopy;  Laterality: N/A;   FOOT SURGERY  2004   MASTECTOMY Right 1997   rheumatology histroy       Current Outpatient Medications  Medication Sig Dispense Refill   Calcium 150 MG TABS Take by mouth daily.     carvedilol (COREG) 6.25 MG tablet TAKE 1 TABLET BY MOUTH TWICE  A DAY 180 tablet 0   cholecalciferol (VITAMIN D) 1000 UNITS tablet Take 2,000 Units by mouth daily.      empagliflozin (JARDIANCE) 10 MG TABS tablet Take 1 tablet (10 mg total) by mouth daily before breakfast. 14 tablet 0   folic acid (FOLVITE) 1 MG tablet Take 1 mg by mouth daily.     furosemide (LASIX) 20 MG tablet Take 1 tablet (20 mg total) by mouth daily. 90 tablet 0   hydroxychloroquine (PLAQUENIL) 200 MG tablet Take one and one-half tablet daily.     lisinopril (ZESTRIL) 10 MG tablet TAKE 1 TABLET BY MOUTH EVERY DAY 90 tablet 3   methotrexate  50 MG/2ML injection INJECT 1ML SUBCUTANEOUSLY ONCE A WEEK; takes 25 mg/mL     pantoprazole (PROTONIX) 40 MG tablet Take 1 tablet (40 mg total) by mouth daily. 30 tablet 10   spironolactone (ALDACTONE) 25 MG tablet TAKE 1 TABLET BY MOUTH EVERY DAY 90 tablet 0   valACYclovir (VALTREX) 500 MG tablet Take 500 mg by mouth daily.     No current facility-administered medications for this visit.    Allergies:   Amoxicillin, Sulfasalazine, and Latex    Social History:  The patient  reports that she has never smoked. She has never used smokeless tobacco. She reports current alcohol use. She reports that she does not use drugs.   Family History:  The patient's family history includes Breast cancer in her paternal aunt; Cancer in her mother and paternal aunt; Heart disease in her father and maternal grandmother.    ROS:  Please see the history of present illness.   Otherwise, review of systems are positive for none.   All other systems are reviewed and negative.    PHYSICAL EXAM: VS:  BP 100/60 (BP Location: Left Arm, Patient Position: Sitting, Cuff Size: Normal)   Pulse 82   Ht $R'5\' 2"'eC$  (1.575 m)   Wt 146 lb 6 oz (66.4 kg)   SpO2 99%   BMI 26.77 kg/m  , BMI Body mass index is 26.77 kg/m. GEN: Well nourished, well developed, in no acute distress  HEENT: normal  Neck: no JVD, carotid bruits, or masses Cardiac: RRR; no murmurs, rubs, or gallops, trace bilateral leg edema Respiratory:  clear to auscultation bilaterally, normal work of breathing GI: soft, nontender, nondistended, + BS MS: no deformity or atrophy  Skin: warm and dry, no rash Neuro:  Strength and sensation are intact Psych: euthymic mood, full affect   EKG:  EKG is ordered today. The ekg ordered today demonstrates normal sinus rhythm with low voltage and nonspecific ST changes.   Recent Labs: 01/30/2021: BUN 19; Creatinine, Ser 0.80; Potassium 4.3; Sodium 136    Lipid Panel    Component Value Date/Time   CHOL 165  11/02/2016 1520   TRIG 110.0 11/02/2016 1520   TRIG 82 02/04/2010 0000   HDL 55.80 11/02/2016 1520   CHOLHDL 3 11/02/2016 1520   VLDL 22.0 11/02/2016 1520   LDLCALC 87 11/02/2016 1520   LDLDIRECT 87.0 11/02/2016 1520      Wt Readings from Last 3 Encounters:  07/07/21 146 lb 6 oz (66.4 kg)  03/03/21 145 lb (65.8 kg)  01/01/21 150 lb (68 kg)        ASSESSMENT AND PLAN:  1.  Chronic systolic heart failure: Due to chemotherapy-induced cardiomyopathy. Most recent ejection fraction was 45 to 50%.  No plans to switch to [facility] given chronically low blood pressure.   Continue carvedilol, lisinopril and spironolactone.  She had significant improvement in symptoms with Jardiance which should be continued.  Cost seems to be an issue as she is currently in the donut hole.  We provided her with samples today.  2. Rheumatoid arthritis: Orencia was stopped due to volume overload.  She is currently on Plaquenil and methotrexate.  3.  mitral regurgitation: Moderate on most recent echocardiogram in May 2021.  The plan is to repeat echocardiogram in May 2023.   Disposition:   FU with me in 6 months.  Signed,  Kathlyn Sacramento, MD  07/07/2021 2:24 PM    [place]

## 2021-09-29 ENCOUNTER — Telehealth: Payer: Self-pay | Admitting: Cardiovascular Disease

## 2021-09-29 NOTE — Telephone Encounter (Signed)
Spoke with the patient.  Patient's Helen Parks has been denied by her insurance. She has new prescription coverage starting in Jan 2023.  Patient sts that the letter she received from her insurance company stated that there is an option for the provider to appeal the decision. Adv the patient that we received the letter as well and will attempt an appeal as requested. Patient voiced appreciation for the assistance.

## 2021-09-29 NOTE — Telephone Encounter (Signed)
Patient calling for assistance with jardiance Medication, it has been denied, please assist

## 2021-09-30 NOTE — Telephone Encounter (Signed)
Letter of appeal for Jardiance and last o/v documenting the reasoning for the medication to be continued faxed to [facility] expedited appeal. Ref # [id number]

## 2021-10-07 NOTE — Telephone Encounter (Signed)
Denial letter received for a tier exception appeal from [facility] for the patients Jardiance.

## 2021-10-20 ENCOUNTER — Other Ambulatory Visit: Payer: Self-pay | Admitting: *Deleted

## 2021-10-20 MED ORDER — EMPAGLIFLOZIN 10 MG PO TABS: 10.0000 mg | ORAL_TABLET | Freq: Every day | ORAL | 1 refills | Status: DC

## 2021-10-22 ENCOUNTER — Other Ambulatory Visit: Payer: Self-pay

## 2021-10-22 MED ORDER — EMPAGLIFLOZIN 10 MG PO TABS: 10.0000 mg | ORAL_TABLET | Freq: Every day | ORAL | 1 refills | Status: DC

## 2021-12-06 ENCOUNTER — Other Ambulatory Visit: Payer: Self-pay | Admitting: Cardiovascular Disease

## 2021-12-07 ENCOUNTER — Other Ambulatory Visit: Payer: Self-pay | Admitting: Internal Medicine

## 2021-12-07 DIAGNOSIS — R1032 Left lower quadrant pain: Secondary | ICD-10-CM

## 2021-12-09 ENCOUNTER — Encounter: Payer: Self-pay | Admitting: Cardiovascular Disease

## 2021-12-17 ENCOUNTER — Ambulatory Visit: Admission: RE | Admit: 2021-12-17 | Payer: Medicare Other | Source: Ambulatory Visit

## 2021-12-22 ENCOUNTER — Other Ambulatory Visit: Payer: Self-pay | Admitting: Physician Assistant

## 2021-12-22 ENCOUNTER — Other Ambulatory Visit (HOSPITAL_COMMUNITY): Payer: Self-pay | Admitting: Physician Assistant

## 2021-12-22 DIAGNOSIS — K769 Liver disease, unspecified: Secondary | ICD-10-CM

## 2021-12-28 ENCOUNTER — Ambulatory Visit
Admission: RE | Admit: 2021-12-28 | Discharge: 2021-12-28 | Disposition: A | Discharge status: Home / Self Care | Payer: Medicare Other | Source: Ambulatory Visit | Attending: Physician Assistant | Admitting: Physician Assistant

## 2021-12-28 DIAGNOSIS — K769 Liver disease, unspecified: Secondary | ICD-10-CM | POA: Diagnosis not present

## 2021-12-28 MED ORDER — GADOBUTROL 1 MMOL/ML IV SOLN
6.0000 mL | Freq: Once | INTRAVENOUS | Status: AC | PRN
  Administered 2021-12-28: 6 mL via INTRAVENOUS

## 2021-12-29 ENCOUNTER — Other Ambulatory Visit: Payer: Self-pay

## 2021-12-29 MED ORDER — FUROSEMIDE 20 MG PO TABS: 20.0000 mg | ORAL_TABLET | Freq: Every day | ORAL | 0 refills | Status: DC

## 2022-01-05 ENCOUNTER — Ambulatory Visit: Payer: Medicare Other | Admitting: Cardiovascular Disease

## 2022-01-08 ENCOUNTER — Ambulatory Visit (INDEPENDENT_AMBULATORY_CARE_PROVIDER_SITE_OTHER): Payer: Medicare Other | Admitting: Cardiovascular Disease

## 2022-01-08 ENCOUNTER — Encounter: Payer: Self-pay | Admitting: Cardiovascular Disease

## 2022-01-08 VITALS — BP 110/60 | HR 80 | Ht 62.0 in | Wt 146.4 lb

## 2022-01-08 DIAGNOSIS — I340 Nonrheumatic mitral (valve) insufficiency: Secondary | ICD-10-CM

## 2022-01-08 DIAGNOSIS — M069 Rheumatoid arthritis, unspecified: Secondary | ICD-10-CM

## 2022-01-08 DIAGNOSIS — I5022 Chronic systolic (congestive) heart failure: Secondary | ICD-10-CM

## 2022-01-08 MED ORDER — FUROSEMIDE 40 MG PO TABS: 40.0000 mg | ORAL_TABLET | Freq: Every day | ORAL | 3 refills | Status: DC

## 2022-01-08 MED ORDER — FUROSEMIDE 40 MG PO TABS
40.0000 mg | ORAL_TABLET | Freq: Every day | ORAL | 3 refills | Status: DC
Start: 2022-01-08 — End: 2022-07-15

## 2022-01-08 NOTE — Patient Instructions (Signed)
Medication Instructions:  ? ?-Please Stop Furosemide 20 mg once daily. ? ?-Start Furosemide 40 mg once daily. ? ?*If you need a refill on your cardiac medications before your next appointment, please call your pharmacy* ? ? ?Lab Work: ?NONE ?If you have labs (blood work) drawn today and your tests are completely normal, you will receive your results only by: ?MyChart Message (if you have MyChart) OR ?A paper copy in the mail ?If you have any lab test that is abnormal or we need to change your treatment, we will call you to review the results. ? ? ?Testing/Procedures: ? ?Your physician has requested that you have an echocardiogram. Echocardiography is a painless test that uses sound waves to create images of your heart. It provides your doctor with information about the size and shape of your heart and how well your heart?s chambers and valves are working. This procedure takes approximately one hour. There are no restrictions for this procedure. ? ? ? ?Follow-Up: ?At [facility], you and your health needs are our priority.  As part of our continuing mission to provide you with exceptional heart care, we have created designated Provider Care Teams.  These Care Teams include your primary Cardiologist (physician) and Advanced Practice Providers (APPs -  Physician Assistants and Nurse Practitioners) who all work together to provide you with the care you need, when you need it. ? ?We recommend signing up for the patient portal called "MyChart".  Sign up information is provided on this After Visit Summary.  MyChart is used to connect with patients for Virtual Visits (Telemedicine).  Patients are able to view lab/test results, encounter notes, upcoming appointments, etc.  Non-urgent messages can be sent to your provider as well.   ?To learn more about what you can do with MyChart, go to [email or web address].   ? ?Your next appointment:   ?6 month(s) ? ?The format for your next appointment:   ?In Person ? ?Provider:    ?You may see Kathlyn Sacramento, MD or one of the following Advanced Practice Providers on your designated Care Team:   ?Murray Hodgkins, NP ?Christell Faith, PA-C ?Cadence Kathlen Mody, PA-C  ? ? ? ? ?Important Information About Sugar ? ? ? ? ? ? ?

## 2022-01-08 NOTE — Progress Notes (Signed)
?  ?Cardiology Office Note ? ? ?Date:  01/08/2022  ? ?ID:  Helen Parks, DOB [birth year], MRN [id number] ? ?PCP:  Idelle Crouch, MD  ?Cardiologist:   Kathlyn Sacramento, MD  ? ?Chief Complaint  ?Patient presents with  ? Other  ?  6 month fu no complaints today. Meds reviewed verbally with pt.  ? ? ?  ?History of Present Illness: ?Helen Parks is a 71 y.o. 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 ?She had normal coronary arteries by catheterization in 2010, history of rheumatoid arthritis, breast cancer, status post mastectomy and chemotherapy in 1997  with ejection fraction noted to be 30% in 2005.  She had an episode of heart failure in 2010 which was likely triggered by the use of Enbrel for rheumatoid arthritis.  ?Her initial ejection fraction was 25-30% which gradually improved to 40-45% in June 2016.  ?Repeat echocardiogram in July 2019 showed an EF of 40 to 45% with mild to moderate mitral regurgitation. ? ?She reported chest pain episodes in April 2021 in the setting of stress related to 2 deaths in her family.  Echocardiogram in May 2021 showed an EF of 45 to 50% with grade 1 diastolic dysfunction, moderate mitral regurgitation and mild to moderate tricuspid regurgitation.  Pulmonary pressure was normal. ? She had a Lexiscan Myoview in October 2021 which showed an EF of 45 to 54% with no ischemia.   ? ?She was treated with [facility]ade which was then changed to [facility] for better coverage.  She did fine initially but recently started having symptoms of urinary tract infection.  She also had abdominal pain and ended up in the emergency room in April with acute diverticulitis that was treated with antibiotics.  She feels that even some of her GI symptoms correlated with Jardiance.  As a result, the medication was discontinued. ?She is feeling better with no chest pain, shortness of breath or palpitations. ? ?Past Medical  History:  ?Diagnosis Date  ? Breast cancer ([place]) 1997  ? s/p mastectomy, right breast, chemo, BRCA negative  ? Cardiomyopathy   ? Cardiomyopathy due to chemotherapy [facility])   ? precipitated by use of Enbrel  2010  ? Chronic systolic congestive heart failure ([place])   ? Collagen vascular disease ([place])   ? RA  ? Diverticulosis of colon (without mention of hemorrhage) April 2012  ? occurred after colonoscopy, [place]  ? Menopause   ? secondary to chemo for BRCA  ? Rheumatoid arthritis ([place])   ? Rheumatoid arthritis(714.0)   ? on Plaquenil methotrxate [facility])  ? ? ?Past Surgical History:  ?Procedure Laterality Date  ? ANKLE FUSION    ? right, seondary to erosive arthritis  ? BREAST BIOPSY Right 1997  ? positive for breast ca  ? BREAST SURGERY Right 1997  ? Lumpectomy  ? CARDIAC CATHETERIZATION  2010  ? normal coronary arteries  ? COLONOSCOPY WITH PROPOFOL N/A 03/26/2016  ? Procedure: COLONOSCOPY WITH PROPOFOL;  Surgeon: Lollie Sails, MD;  Location[facility] ENDOSCOPY;  Service: Endoscopy;  Laterality: N/A;  ? COLONOSCOPY WITH PROPOFOL N/A 03/03/2021  ? Procedure: COLONOSCOPY WITH PROPOFOL;  Surgeon: Lucilla Lame, MD;  Location: [facility] ENDOSCOPY;  Service: Endoscopy;  Laterality: N/A;  ? ESOPHAGOGASTRODUODENOSCOPY N/A 03/03/2021  ? Procedure: ESOPHAGOGASTRODUODENOSCOPY (EGD);  Surgeon: Lucilla Lame, MD;  Location: [facility] ENDOSCOPY;  Service: Endoscopy;  Laterality: N/A;  ? FOOT SURGERY  2004  ? MASTECTOMY Right 1997  ?  rheumatology histroy    ? ? ? ?Current Outpatient Medications  ?Medication Sig Dispense Refill  ? Calcium 150 MG TABS Take by mouth daily.    ? carvedilol (COREG) 6.25 MG tablet TAKE 1 TABLET BY MOUTH TWICE A DAY 180 tablet 0  ? cholecalciferol (VITAMIN D) 1000 UNITS tablet Take 2,000 Units by mouth daily.     ? folic acid (FOLVITE) 1 MG tablet Take 1 mg by mouth daily.    ? hydroxychloroquine (PLAQUENIL) 200 MG tablet Take one and one-half tablet daily.    ? lisinopril (ZESTRIL) 10 MG tablet TAKE 1 TABLET BY  MOUTH EVERY DAY 90 tablet 3  ? methotrexate 50 MG/2ML injection INJECT 1ML SUBCUTANEOUSLY ONCE A WEEK; takes 25 mg/mL    ? pantoprazole (PROTONIX) 40 MG tablet Take 1 tablet (40 mg total) by mouth daily. 30 tablet 10  ? spironolactone (ALDACTONE) 25 MG tablet TAKE 1 TABLET BY MOUTH EVERY DAY 90 tablet 0  ? valACYclovir (VALTREX) 500 MG tablet Take 500 mg by mouth daily.    ? furosemide (LASIX) 40 MG tablet Take 1 tablet (40 mg total) by mouth daily. 90 tablet 3  ? ?No current facility-administered medications for this visit.  ? ? ?Allergies:   Amoxicillin, Sulfasalazine, and Latex  ? ? ?Social History:  The patient  reports that she has never smoked. She has never used smokeless tobacco. She reports current alcohol use. She reports that she does not use drugs.  ? ?Family History:  The patient's family history includes Breast cancer in her paternal aunt; Cancer in her mother and paternal aunt; Heart disease in her father and maternal grandmother.  ? ? ?ROS:  Please see the history of present illness.   Otherwise, review of systems are positive for none.   All other systems are reviewed and negative.  ? ? ?PHYSICAL EXAM: ?VS:  BP 110/60 (BP Location: Left Arm, Patient Position: Sitting, Cuff Size: Normal)   Pulse 80   Ht $R'5\' 2"'Ut$  (1.575 m)   Wt 146 lb 6 oz (66.4 kg)   SpO2 96%   BMI 26.77 kg/m?  , BMI Body mass index is 26.77 kg/m?. ?GEN: Well nourished, well developed, in no acute distress  ?HEENT: normal  ?Neck: no JVD, carotid bruits, or masses ?Cardiac: RRR; no murmurs, rubs, or gallops, trace bilateral leg edema ?Respiratory:  clear to auscultation bilaterally, normal work of breathing ?GI: soft, nontender, nondistended, + BS ?MS: no deformity or atrophy  ?Skin: warm and dry, no rash ?Neuro:  Strength and sensation are intact ?Psych: euthymic mood, full affect ? ? ?EKG:  EKG is ordered today. ?The ekg ordered today demonstrates normal sinus rhythm with low voltage  ? ?Recent Labs: ?01/30/2021: BUN 19; Creatinine,  Ser 0.80; Potassium 4.3; Sodium 136  ? ? ?Lipid Panel ?   ?Component Value Date/Time  ? CHOL 165 11/02/2016 1520  ? TRIG 110.0 11/02/2016 1520  ? TRIG 82 02/04/2010 0000  ? HDL 55.80 11/02/2016 1520  ? CHOLHDL 3 11/02/2016 1520  ? VLDL 22.0 11/02/2016 1520  ? [place] 87 11/02/2016 1520  ? LDLDIRECT 87.0 11/02/2016 1520  ? ?  ? ?Wt Readings from Last 3 Encounters:  ?01/08/22 146 lb 6 oz (66.4 kg)  ?07/07/21 146 lb 6 oz (66.4 kg)  ?03/03/21 145 lb (65.8 kg)  ?  ? ? ? ? ?ASSESSMENT AND PLAN: ? ?1.  Chronic systolic heart failure: Due to chemotherapy-induced cardiomyopathy. Most recent ejection fraction was 45 to 50%.  No plans  to switch to [facility] given chronically low blood pressure.   Continue carvedilol, lisinopril and spironolactone.  Jardiance was discontinued due to UTIs and abdominal pain.  Will increase furosemide back to 40 mg once daily. ? ?2. Rheumatoid arthritis: Orencia was stopped due to volume overload.  She is currently on Plaquenil and methotrexate. ? ?3.  mitral regurgitation: Moderate on most recent echocardiogram in May 2021.  I requested a repeat echocardiogram. ? ? ?Disposition:   FU with me in 6 months. ? ?Signed, ? ?Kathlyn Sacramento, MD  ?01/08/2022 1:34 PM    ?[place] ?

## 2022-01-21 ENCOUNTER — Other Ambulatory Visit: Payer: Self-pay | Admitting: Gastroenterology

## 2022-02-04 ENCOUNTER — Ambulatory Visit (INDEPENDENT_AMBULATORY_CARE_PROVIDER_SITE_OTHER): Payer: Medicare Other

## 2022-02-04 DIAGNOSIS — I340 Nonrheumatic mitral (valve) insufficiency: Secondary | ICD-10-CM | POA: Diagnosis not present

## 2022-02-04 LAB — ECHOCARDIOGRAM COMPLETE
Area-P 1/2: 4.1 cm2
Calc EF: 43.7 %
S' Lateral: 3.7 cm
Single Plane A2C EF: 43.5 %
Single Plane A4C EF: 41.1 %

## 2022-02-23 ENCOUNTER — Other Ambulatory Visit: Payer: Self-pay | Admitting: Internal Medicine

## 2022-02-23 DIAGNOSIS — Z1231 Encounter for screening mammogram for malignant neoplasm of breast: Secondary | ICD-10-CM

## 2022-05-24 ENCOUNTER — Ambulatory Visit
Admission: RE | Admit: 2022-05-24 | Discharge: 2022-05-24 | Disposition: A | Discharge status: Home / Self Care | Payer: Medicare Other | Source: Ambulatory Visit | Attending: Internal Medicine | Admitting: Internal Medicine

## 2022-05-24 DIAGNOSIS — Z1231 Encounter for screening mammogram for malignant neoplasm of breast: Secondary | ICD-10-CM | POA: Diagnosis present

## 2022-07-15 ENCOUNTER — Encounter: Payer: Self-pay | Admitting: Cardiovascular Disease

## 2022-07-15 ENCOUNTER — Ambulatory Visit: Payer: Medicare Other | Attending: Cardiovascular Disease | Admitting: Cardiovascular Disease

## 2022-07-15 VITALS — BP 90/60 | HR 84 | Ht 62.0 in | Wt 142.1 lb

## 2022-07-15 DIAGNOSIS — I5022 Chronic systolic (congestive) heart failure: Secondary | ICD-10-CM | POA: Diagnosis not present

## 2022-07-15 DIAGNOSIS — I340 Nonrheumatic mitral (valve) insufficiency: Secondary | ICD-10-CM | POA: Insufficient documentation

## 2022-07-15 MED ORDER — FUROSEMIDE 20 MG PO TABS: 20.0000 mg | ORAL_TABLET | Freq: Two times a day (BID) | ORAL | 3 refills | Status: DC

## 2022-07-15 NOTE — Patient Instructions (Signed)
Medication Instructions:  FUROSEMIDE: Take 20 mg twice daily  *If you need a refill on your cardiac medications before your next appointment, please call your pharmacy*   Lab Work: None ordered If you have labs (blood work) drawn today and your tests are completely normal, you will receive your results only by: [place] (if you have MyChart) OR A paper copy in the mail If you have any lab test that is abnormal or we need to change your treatment, we will call you to review the results.   Testing/Procedures: None ordered   Follow-Up: At [facility], you and your health needs are our priority.  As part of our continuing mission to provide you with exceptional heart care, we have created designated Provider Care Teams.  These Care Teams include your primary Cardiologist (physician) and Advanced Practice Providers (APPs -  Physician Assistants and Nurse Practitioners) who all work together to provide you with the care you need, when you need it.  We recommend signing up for the patient portal called "MyChart".  Sign up information is provided on this After Visit Summary.  MyChart is used to connect with patients for Virtual Visits (Telemedicine).  Patients are able to view lab/test results, encounter notes, upcoming appointments, etc.  Non-urgent messages can be sent to your provider as well.   To learn more about what you can do with MyChart, go to [email or web address].    Your next appointment:   12 month(s)  The format for your next appointment:   In Person  Provider:   You may see Kathlyn Sacramento, MD or one of the following Advanced Practice Providers on your designated Care Team:   Murray Hodgkins, NP Christell Faith, PA-C Cadence Kathlen Mody, PA-C Gerrie Nordmann, NP    Important Information About Sugar

## 2022-07-15 NOTE — Progress Notes (Signed)
Cardiology Office Note   Date:  07/15/2022   ID:  GARRETT BOWRING, DOB 15-Jul-1951, MRN [id number]  PCP:  Idelle Crouch, MD  Cardiologist:   Kathlyn Sacramento, MD   Chief Complaint  Patient presents with   Other    6 month fu no complaints today. Meds reviewed verbally with pt.      History of Present Illness: Helen Parks is a 71 y.o. 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 She had normal coronary arteries by catheterization in 2010, history of rheumatoid arthritis, breast cancer, status post mastectomy and chemotherapy in 1997  with ejection fraction noted to be 30% in 2005.  She had an episode of heart failure in 2010 which was likely triggered by the use of Enbrel for rheumatoid arthritis.  Her initial ejection fraction was 25-30% which gradually improved to 50-55% .  She had a Lexiscan Myoview in October 2021 which showed an EF of 45 to 54% with no ischemia.    She did not tolerate SGLT2 inhibitors due to urinary tract infection and GI symptoms.  Echocardiogram in June 2023 showed an EF of 50 to 55% with mild to moderate mitral regurgitation.  She has been doing extremely well with no chest pain, shortness of breath or palpitations.  She takes furosemide twice daily instead of 40 mg once daily which seems to be working better for her.  Past Medical History:  Diagnosis Date   Breast cancer ([place]) 1997   s/p mastectomy, right breast, chemo, BRCA negative   Cardiomyopathy    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[place])    precipitated by use of Enbrel  4098   Chronic systolic congestive heart failure ([facility])    Collagen vascular disease ([place])    RA   Diverticulosis of colon (without mention of hemorrhage) April 2012   occurred after colonoscopy, [place]   Menopause    secondary to chemo for BRCA   Rheumatoid arthritis ([place])    Rheumatoid arthritis(714.0)    on Plaquenil methotrxate  [facility])    Past Surgical History:  Procedure Laterality Date   ANKLE FUSION     right, seondary to erosive arthritis   BREAST BIOPSY Right 1997   positive for breast ca   BREAST SURGERY Right 1997   Lumpectomy   CARDIAC CATHETERIZATION  2010   normal coronary arteries   COLONOSCOPY WITH PROPOFOL N/A 03/26/2016   Procedure: COLONOSCOPY WITH PROPOFOL;  Surgeon: Lollie Sails, MD;  Location[facility] ENDOSCOPY;  Service: Endoscopy;  Laterality: N/A;   COLONOSCOPY WITH PROPOFOL N/A 03/03/2021   Procedure: COLONOSCOPY WITH PROPOFOL;  Surgeon: Lucilla Lame, MD;  Location[facility] ENDOSCOPY;  Service: Endoscopy;  Laterality: N/A;   ESOPHAGOGASTRODUODENOSCOPY N/A 03/03/2021   Procedure: ESOPHAGOGASTRODUODENOSCOPY (EGD);  Surgeon: Lucilla Lame, MD;  Location: [facility] ENDOSCOPY;  Service: Endoscopy;  Laterality: N/A;   FOOT SURGERY  2004   MASTECTOMY Right 1997   rheumatology histroy       Current Outpatient Medications  Medication Sig Dispense Refill   Calcium 150 MG TABS Take by mouth daily.     carvedilol (COREG) 6.25 MG tablet TAKE 1 TABLET BY MOUTH TWICE A DAY 180 tablet 0   cholecalciferol (VITAMIN D) 1000 UNITS tablet Take 2,000 Units by mouth daily.      folic acid (FOLVITE) 1 MG tablet Take 1 mg by mouth daily.     hydroxychloroquine (PLAQUENIL) 200 MG tablet Take one and  one-half tablet daily.     lisinopril (ZESTRIL) 10 MG tablet TAKE 1 TABLET BY MOUTH EVERY DAY 90 tablet 3   methotrexate (RHEUMATREX) 2.5 MG tablet Take 20 mg by mouth once a week.     pantoprazole (PROTONIX) 40 MG tablet TAKE 1 TABLET BY MOUTH EVERY DAY 90 tablet 0   spironolactone (ALDACTONE) 25 MG tablet TAKE 1 TABLET BY MOUTH EVERY DAY 90 tablet 0   valACYclovir (VALTREX) 500 MG tablet Take 500 mg by mouth daily.     furosemide (LASIX) 20 MG tablet Take 1 tablet (20 mg total) by mouth 2 (two) times daily. 180 tablet 3   No current facility-administered medications for this visit.    Allergies:    Amoxicillin, Sulfasalazine, and Latex    Social History:  The patient  reports that she has never smoked. She has never used smokeless tobacco. She reports current alcohol use. She reports that she does not use drugs.   Family History:  The patient's family history includes Breast cancer in her paternal aunt; Cancer in her mother and paternal aunt; Heart disease in her father and maternal grandmother.    ROS:  Please see the history of present illness.   Otherwise, review of systems are positive for none.   All other systems are reviewed and negative.    PHYSICAL EXAM: VS:  BP 90/60 (BP Location: Left Arm, Patient Position: Sitting, Cuff Size: Normal)   Pulse 84   Ht 5' 2" (1.575 m)   Wt 142 lb 2 oz (64.5 kg)   SpO2 98%   BMI 26.00 kg/m  , BMI Body mass index is 26 kg/m. GEN: Well nourished, well developed, in no acute distress  HEENT: normal  Neck: no JVD, carotid bruits, or masses Cardiac: RRR; no murmurs, rubs, or gallops, trace bilateral leg edema Respiratory:  clear to auscultation bilaterally, normal work of breathing GI: soft, nontender, nondistended, + BS MS: no deformity or atrophy  Skin: warm and dry, no rash Neuro:  Strength and sensation are intact Psych: euthymic mood, full affect   EKG:  EKG is ordered today. The ekg ordered today demonstrates normal sinus rhythm with low voltage .  Occasional PVCs.  Recent Labs: No results found for requested labs within last 365 days.    Lipid Panel    Component Value Date/Time   CHOL 165 11/02/2016 1520   TRIG 110.0 11/02/2016 1520   TRIG 82 02/04/2010 0000   HDL 55.80 11/02/2016 1520   CHOLHDL 3 11/02/2016 1520   VLDL 22.0 11/02/2016 1520   LDLCALC 87 11/02/2016 1520   LDLDIRECT 87.0 11/02/2016 1520      Wt Readings from Last 3 Encounters:  07/15/22 142 lb 2 oz (64.5 kg)  01/08/22 146 lb 6 oz (66.4 kg)  07/07/21 146 lb 6 oz (66.4 kg)        ASSESSMENT AND PLAN:  1.  Chronic systolic heart failure: Due  to chemotherapy-induced cardiomyopathy. Most recent ejection fraction was 50-55 %.  No plans to switch to [facility] given chronically low blood pressure.   Continue carvedilol, lisinopril and spironolactone.  Jardiance was discontinued due to UTIs and abdominal pain.  I switch her furosemide prescription to 20 mg twice daily.  2. Rheumatoid arthritis: Orencia was stopped due to volume overload.  She is currently on Plaquenil and methotrexate.  3.  mitral regurgitation: This was mild to moderate on most recent echocardiogram in June 2023.  The plan is to repeat echocardiogram in 2025.  Disposition:   FU with me in 12 months.  Signed,  Kathlyn Sacramento, MD  07/15/2022 1:45 PM    [facility]

## 2022-07-19 ENCOUNTER — Telehealth: Payer: Self-pay | Admitting: Cardiovascular Disease

## 2022-07-19 MED ORDER — FUROSEMIDE 20 MG PO TABS: 20.0000 mg | ORAL_TABLET | Freq: Two times a day (BID) | ORAL | 3 refills | Status: DC

## 2022-07-19 NOTE — Telephone Encounter (Signed)
*  STAT* If patient is at the pharmacy, call can be transferred to refill team.   1. Which medications need to be refilled? (please list name of each medication and dose if known) furosemide (LASIX) 20 MG tablet   2. Which pharmacy/location (including street and city if local pharmacy) is medication to be sent to? [facility] - [place], [address]   3. Do they need a 30 day or 90 day supply? [place]

## 2022-11-20 ENCOUNTER — Other Ambulatory Visit: Payer: Self-pay | Admitting: Cardiovascular Disease

## 2022-11-22 NOTE — Telephone Encounter (Signed)
Spoke with Thurmond Butts at [facility] attempting to give a verbal order for Lisinopril due to a failed e-prescribe transmission to the pharmacy. Thurmond Butts states that a year prescription was already sent two days ago.

## 2022-11-24 ENCOUNTER — Telehealth: Payer: Self-pay | Admitting: Cardiovascular Disease

## 2022-11-24 MED ORDER — FUROSEMIDE 20 MG PO TABS: 20.0000 mg | ORAL_TABLET | Freq: Two times a day (BID) | ORAL | 2 refills | Status: DC

## 2022-11-24 NOTE — Telephone Encounter (Signed)
*  STAT* If patient is at the pharmacy, call can be transferred to refill team.   1. Which medications need to be refilled? (please list name of each medication and dose if known) furosemide (LASIX) 20 MG tablet   2. Which pharmacy/location (including street and city if local pharmacy) is medication to be sent to? [id number] [place], [place] - [address]    3. Do they need a 30 day or 90 day supply? 90 Day Supply

## 2022-12-18 ENCOUNTER — Emergency Department
Admission: EM | Admit: 2022-12-18 | Discharge: 2022-12-18 | Disposition: A | Discharge status: Home / Self Care | Payer: Medicare Other | Attending: Emergency Medicine | Admitting: Emergency Medicine

## 2022-12-18 ENCOUNTER — Emergency Department: Payer: Medicare Other

## 2022-12-18 ENCOUNTER — Encounter: Payer: Self-pay | Admitting: Emergency Medicine

## 2022-12-18 DIAGNOSIS — K5732 Diverticulitis of large intestine without perforation or abscess without bleeding: Secondary | ICD-10-CM | POA: Insufficient documentation

## 2022-12-18 DIAGNOSIS — I509 Heart failure, unspecified: Secondary | ICD-10-CM | POA: Insufficient documentation

## 2022-12-18 DIAGNOSIS — R1032 Left lower quadrant pain: Secondary | ICD-10-CM | POA: Diagnosis present

## 2022-12-18 LAB — COMPREHENSIVE METABOLIC PANEL
ALT: 16 U/L (ref 0–44)
AST: 34 U/L (ref 15–41)
Albumin: 4 g/dL (ref 3.5–5.0)
Alkaline Phosphatase: 38 U/L (ref 38–126)
Anion gap: 8 (ref 5–15)
BUN: 18 mg/dL (ref 8–23)
CO2: 26 mmol/L (ref 22–32)
Calcium: 9 mg/dL (ref 8.9–10.3)
Chloride: 100 mmol/L (ref 98–111)
Creatinine, Ser: 0.69 mg/dL (ref 0.44–1.00)
GFR, Estimated: >60 mL/min (ref >60)
Glucose, Bld: 106 mg/dL — ABNORMAL HIGH (ref 70–99)
Potassium: 4.3 mmol/L (ref 3.5–5.1)
Sodium: 134 mmol/L — ABNORMAL LOW (ref 135–145)
Total Bilirubin: 1.2 mg/dL (ref 0.3–1.2)
Total Protein: 6.6 g/dL (ref 6.5–8.1)

## 2022-12-18 LAB — CBC WITH DIFFERENTIAL/PLATELET
Abs Immature Granulocytes: 0.04 10*3/uL (ref 0.00–0.07)
Basophils Absolute: 0.1 10*3/uL (ref 0.0–0.1)
Basophils Relative: 1 %
Eosinophils Absolute: 0.1 10*3/uL (ref 0.0–0.5)
Eosinophils Relative: 1 %
HCT: 35.4 % — ABNORMAL LOW (ref 36.0–46.0)
Hemoglobin: 11.7 g/dL — ABNORMAL LOW (ref 12.0–15.0)
Immature Granulocytes: 1 %
Lymphocytes Relative: 15 %
Lymphs Abs: 1.2 10*3/uL (ref 0.7–4.0)
MCH: 33.7 pg (ref 26.0–34.0)
MCHC: 33.1 g/dL (ref 30.0–36.0)
MCV: 102 fL — ABNORMAL HIGH (ref 80.0–100.0)
Monocytes Absolute: 0.7 10*3/uL (ref 0.1–1.0)
Monocytes Relative: 10 %
Neutro Abs: 5.6 10*3/uL (ref 1.7–7.7)
Neutrophils Relative %: 72 %
Platelets: 188 10*3/uL (ref 150–400)
RBC: 3.47 MIL/uL — ABNORMAL LOW (ref 3.87–5.11)
RDW: 13.4 % (ref 11.5–15.5)
WBC: 7.6 10*3/uL (ref 4.0–10.5)
nRBC: 0 % (ref 0.0–0.2)

## 2022-12-18 LAB — URINALYSIS, ROUTINE W REFLEX MICROSCOPIC
Bilirubin Urine: NEGATIVE
Glucose, UA: NEGATIVE mg/dL
Hgb urine dipstick: NEGATIVE
Ketones, ur: NEGATIVE mg/dL
Leukocytes,Ua: NEGATIVE
Nitrite: NEGATIVE
Protein, ur: NEGATIVE mg/dL
Specific Gravity, Urine: 1.001 — ABNORMAL LOW (ref 1.005–1.030)
pH: 8 (ref 5.0–8.0)

## 2022-12-18 LAB — LIPASE, BLOOD: Lipase: 38 U/L (ref 11–51)

## 2022-12-18 MED ORDER — AMOXICILLIN-POT CLAVULANATE 875-125 MG PO TABS: 1.0000 | ORAL_TABLET | Freq: Two times a day (BID) | ORAL | 0 refills | Status: DC

## 2022-12-18 MED ORDER — METRONIDAZOLE 500 MG PO TABS
500.0000 mg | ORAL_TABLET | Freq: Once | ORAL | Status: AC
  Administered 2022-12-18: 500 mg via ORAL
  Filled 2022-12-18: qty 1

## 2022-12-18 MED ORDER — CIPROFLOXACIN HCL 500 MG PO TABS: 500.0000 mg | ORAL_TABLET | Freq: Two times a day (BID) | ORAL | 0 refills | Status: AC

## 2022-12-18 MED ORDER — METRONIDAZOLE 500 MG PO TABS: 500.0000 mg | ORAL_TABLET | Freq: Three times a day (TID) | ORAL | 0 refills | Status: AC

## 2022-12-18 MED ORDER — OXYCODONE HCL 5 MG PO TABS: 5.0000 mg | ORAL_TABLET | Freq: Three times a day (TID) | ORAL | 0 refills | Status: AC | PRN

## 2022-12-18 MED ORDER — IOHEXOL 300 MG/ML  SOLN
100.0000 mL | Freq: Once | INTRAMUSCULAR | Status: AC | PRN
  Administered 2022-12-18: 100 mL via INTRAVENOUS

## 2022-12-18 MED ORDER — ONDANSETRON 4 MG PO TBDP: 4.0000 mg | ORAL_TABLET | Freq: Three times a day (TID) | ORAL | 0 refills | Status: AC | PRN

## 2022-12-18 MED ORDER — CIPROFLOXACIN HCL 500 MG PO TABS
500.0000 mg | ORAL_TABLET | Freq: Once | ORAL | Status: AC
  Administered 2022-12-18: 500 mg via ORAL
  Filled 2022-12-18: qty 1

## 2022-12-18 MED ORDER — ONDANSETRON HCL 4 MG/2ML IJ SOLN
4.0000 mg | Freq: Once | INTRAMUSCULAR | Status: AC
  Administered 2022-12-18: 4 mg via INTRAVENOUS
  Filled 2022-12-18: qty 2

## 2022-12-18 MED ORDER — AMOXICILLIN-POT CLAVULANATE 875-125 MG PO TABS
1.0000 | ORAL_TABLET | Freq: Once | ORAL | Status: DC
  Filled 2022-12-18: qty 1

## 2022-12-18 MED ORDER — MORPHINE SULFATE (PF) 4 MG/ML IV SOLN
4.0000 mg | Freq: Once | INTRAVENOUS | Status: AC
  Administered 2022-12-18: 4 mg via INTRAVENOUS
  Filled 2022-12-18: qty 1

## 2022-12-18 NOTE — ED Provider Notes (Signed)
[facility] Provider Note    Event Date/Time   First MD Initiated Contact with Patient 12/18/22 [phone]     (approximate)   History   Abdominal Pain   HPI  Helen Parks is a 72 y.o. female who presents to the ED for evaluation of Abdominal Pain   I reviewed rheumatology clinic visit from February.  History of RA on methotrexate and Plaquenil.  Otherwise history of CHF, EF 50%  Patient self-reports a history of diverticulitis and presents to the ED this morning with LLQ abdominal pain consistent with previous episodes of diverticulitis.  Denies stool changes, emesis or urinary changes.  No fevers.  No radiation of the pain, just a couple hours of sharp LLQ abdominal pain.  Physical Exam   Triage Vital Signs: ED Triage Vitals [12/18/22 0352]  Enc Vitals Group     BP      Pulse      Resp      Temp      Temp src      SpO2      Weight 140 lb (63.5 kg)     Height 5\' 2"  (1.575 m)     Head Circumference      Peak Flow      Pain Score 9     Pain Loc      Pain Edu?      Excl. in GC?     Most recent vital signs: Vitals:   12/18/22 0359 12/18/22 0400  BP: 120/68 120/68  Pulse: 90 88  Resp: 17   Temp: 98.3 F (36.8 C)   SpO2: 98% 100%    General: Awake, no distress.  CV:  Good peripheral perfusion.  Resp:  Normal effort.  Abd:  No distention. Localized LLQ TTP without peritoneal features.  MSK:  No deformity noted.  Neuro:  No focal deficits appreciated. Other:     ED Results / Procedures / Treatments   Labs (all labs ordered are listed, but only abnormal results are displayed) Labs Reviewed  URINALYSIS, ROUTINE W REFLEX MICROSCOPIC - Abnormal; Notable for the following components:      Result Value   Color, Urine COLORLESS (*)    APPearance CLEAR (*)    Specific Gravity, Urine 1.001 (*)    All other components within normal limits  COMPREHENSIVE METABOLIC PANEL - Abnormal; Notable for the following components:   Sodium 134 (*)     Glucose, Bld 106 (*)    All other components within normal limits  CBC WITH DIFFERENTIAL/PLATELET - Abnormal; Notable for the following components:   RBC 3.47 (*)    Hemoglobin 11.7 (*)    HCT 35.4 (*)    MCV 102.0 (*)    All other components within normal limits  LIPASE, BLOOD  CBC WITH DIFFERENTIAL/PLATELET    EKG   RADIOLOGY CT abdomen/pelvis interpreted by me with evidence of diverticulitis without abscess  Official radiology report(s): CT ABDOMEN PELVIS W CONTRAST  Result Date: 12/18/2022 CLINICAL DATA:  72 year old female with acute onset left lower quadrant pain. EXAM: CT ABDOMEN AND PELVIS WITH CONTRAST TECHNIQUE: Multidetector CT imaging of the abdomen and pelvis was performed using the standard protocol following bolus administration of intravenous contrast. RADIATION DOSE REDUCTION: This exam was performed according to the departmental dose-optimization program which includes automated exposure control, adjustment of the mA and/or kV according to patient size and/or use of iterative reconstruction technique. CONTRAST:  OMNIPAQUE IOHEXOL 300 MG/ML  SOLN COMPARISON:  CT  Abdomen and Pelvis 08/04/2017. FINDINGS: Lower chest: Chronic right mastectomy. Heart size remains normal. No pericardial or pleural effusion. Stable lung base ventilation, mild chronic right greater than left lower lobe scarring and/or atelectasis. Hepatobiliary: Chronic and benign appearing hepatic cysts, the largest is lobulated in the left lobe now 3.5 cm (no follow-up imaging recommended). Negative gallbladder. Pancreas: Negative. Spleen: Negative. Adrenals/Urinary Tract: Negative.  Chronic pelvic phleboliths. Stomach/Bowel: Rectal retained stool, stool ball measuring 6 cm diameter series 2, image 81. Upstream distal sigmoid colon is within normal limits. In the left lower quadrant the junction of the descending and sigmoid colon is actively inflamed with indistinct wall thickening and surrounding mesenteric  stranding. Underlying diverticulosis. See coronal image 32. Trace dependent free fluid or soft tissue thickening at the left pelvic inlet series 2, image 63. No extraluminal gas. No organized or drainable fluid collection. Upstream descending colon diverticulosis without additional inflammation. Negative transverse and right colon. Diminutive or absent appendix. No dilated small bowel. Decompressed stomach and duodenum. No free air or free fluid in the abdomen. Vascular/Lymphatic: Very mild Aortoiliac calcified atherosclerosis. Major arterial structures are patent. Portal venous system is patent. No lymphadenopathy. Reproductive: Stable retroverted uterus, negative. Other: No pelvis free fluid. Musculoskeletal: No acute osseous abnormality identified. IMPRESSION: 1. Positive for Acute Diverticulitis at the junction of the descending and sigmoid colon. Confluent regional inflammation and possible trace free fluid. But no abscess or other complicating features. 2. No other acute or inflammatory process identified in the abdomen or pelvis. Electronically Signed   By: Odessa Fleming M.D.   On: 12/18/2022 06:39    PROCEDURES and INTERVENTIONS:  Procedures  Medications  ciprofloxacin (CIPRO) tablet 500 mg (has no administration in time range)  metroNIDAZOLE (FLAGYL) tablet 500 mg (has no administration in time range)  morphine (PF) 4 MG/ML injection 4 mg (4 mg Intravenous Given 12/18/22 0420)  ondansetron (ZOFRAN) injection 4 mg (4 mg Intravenous Given 12/18/22 0420)  iohexol (OMNIPAQUE) 300 MG/ML solution 100 mL (100 mLs Intravenous Contrast Given 12/18/22 0605)     IMPRESSION / MDM / ASSESSMENT AND PLAN / ED COURSE  I reviewed the triage vital signs and the nursing notes.  Differential diagnosis includes, but is not limited to, SBO, cystitis, pyelonephritis, ureterolithiasis, diverticulitis, appendicitis  {Patient presents with symptoms of an acute illness or injury that is potentially  life-threatening.  Pleasant 72 year old woman presents to the ED with isolated LLQ pain and evidence of uncomplicated diverticulitis suitable for outpatient management with a short course of antibiotics.  Has some localized tenderness without peritoneal features.  Reassuring vital signs and blood work.  No leukocytosis or significant metabolic derangements.  Urine without infectious features.  CT with evidence of uncomplicated diverticulitis.  Due to her immunocompromise status due to her RA medications in particular we will do a short course of Cipro and Flagyl for her diverticulitis.  Provided Zofran and oxycodone for symptom relief.  Discussed return precautions.  She is suitable for outpatient management.  Clinical Course as of 12/18/22 0703  Sat Dec 18, 2022  1610 reassessed [DS]  0651 Reassessed and provided the patient some water for p.o. challenge.  She reports her pain is controlled and feeling okay.  We discussed signs of uncomplicated diverticulitis.  Antibiotics due to her RA medications.  We discussed dietary adjustments.  We discussed return precautions [DS]    Clinical Course User Index [DS] Delton Prairie, MD     FINAL CLINICAL IMPRESSION(S) / ED DIAGNOSES   Final diagnoses:  Diverticulitis of  large intestine without perforation or abscess without bleeding     Rx / DC Orders   ED Discharge Orders          Ordered    amoxicillin-clavulanate (AUGMENTIN) 875-125 MG tablet  2 times daily,   Status:  Discontinued        12/18/22 0650    oxyCODONE (ROXICODONE) 5 MG immediate release tablet  Every 8 hours PRN        12/18/22 0650    ondansetron (ZOFRAN-ODT) 4 MG disintegrating tablet  Every 8 hours PRN        12/18/22 0650    ciprofloxacin (CIPRO) 500 MG tablet  2 times daily        12/18/22 0702    metroNIDAZOLE (FLAGYL) 500 MG tablet  3 times daily        12/18/22 1610             Note:  This document was prepared using Dragon voice recognition software and may  include unintentional dictation errors.   Delton Prairie, MD 12/18/22 [phone]

## 2022-12-18 NOTE — Discharge Instructions (Signed)
Augmentin antibiotics twice daily for the next 5 days  Zofran nausea medicine as needed for nausea/vomiting  Oxycodone pain medication as needed for more severe/breakthrough pain  Use Tylenol for pain and fevers.  Up to 1000 mg per dose, up to 4 times per day.  Do not take more than 4000 mg of Tylenol/acetaminophen within 24 hours..  Focus on liquids and staying hydrated, plenty of fiber.  Continue your other regular prescription medications

## 2022-12-18 NOTE — ED Triage Notes (Signed)
Pt presents ambulatory to triage via POV with complaints of LLQ pain that started ~1 hour ago. Hx of diverticulitis. Rates the pain 9/10 -no meds taken PTA. A&Ox4 at this time. Denies N/V/D, CP or SOB.

## 2023-02-21 ENCOUNTER — Other Ambulatory Visit: Payer: Self-pay | Admitting: Cardiovascular Disease

## 2023-03-07 ENCOUNTER — Other Ambulatory Visit: Payer: Self-pay | Admitting: Internal Medicine

## 2023-03-07 DIAGNOSIS — Z1231 Encounter for screening mammogram for malignant neoplasm of breast: Secondary | ICD-10-CM

## 2023-05-20 ENCOUNTER — Other Ambulatory Visit: Payer: Self-pay | Admitting: Cardiovascular Disease

## 2023-05-26 ENCOUNTER — Ambulatory Visit
Admission: RE | Admit: 2023-05-26 | Discharge: 2023-05-26 | Disposition: A | Discharge status: Home / Self Care | Payer: Medicare Other | Source: Ambulatory Visit | Attending: Internal Medicine | Admitting: Internal Medicine

## 2023-05-26 DIAGNOSIS — Z1231 Encounter for screening mammogram for malignant neoplasm of breast: Secondary | ICD-10-CM | POA: Diagnosis present

## 2023-09-27 ENCOUNTER — Ambulatory Visit: Payer: Medicare Other | Attending: Cardiovascular Disease | Admitting: Cardiovascular Disease

## 2023-09-27 ENCOUNTER — Encounter: Payer: Self-pay | Admitting: Cardiovascular Disease

## 2023-09-27 VITALS — BP 104/60 | HR 91 | Ht 62.0 in | Wt 134.2 lb

## 2023-09-27 DIAGNOSIS — I5022 Chronic systolic (congestive) heart failure: Secondary | ICD-10-CM | POA: Diagnosis present

## 2023-09-27 DIAGNOSIS — I340 Nonrheumatic mitral (valve) insufficiency: Secondary | ICD-10-CM | POA: Insufficient documentation

## 2023-09-27 NOTE — Patient Instructions (Signed)
Medication Instructions:  Your physician recommends that you continue on your current medications as directed. Please refer to the Current Medication list given to you today.  *If you need a refill on your cardiac medications before your next appointment, please call your pharmacy*   Follow-Up: At [facility], you and your health needs are our priority.  As part of our continuing mission to provide you with exceptional heart care, we have created designated Provider Care Teams.  These Care Teams include your primary Cardiologist (physician) and Advanced Practice Providers (APPs -  Physician Assistants and Nurse Practitioners) who all work together to provide you with the care you need, when you need it.  Your next appointment:   1 year(s)  Provider:   Lorine Bears, MD     Other Instructions:

## 2023-09-27 NOTE — Progress Notes (Signed)
Cardiology Office Note   Date:  09/27/2023   ID:  Fantasy, Donald 07-17-1951, MRN [id number]  PCP:  Marguarite Arbour, MD  Cardiologist:   Lorine Bears, MD   Chief Complaint  Patient presents with   Follow-up    Patient denies new or acute cardiac problems/concerns today.        History of Present Illness: LAYLONIE MARZEC is a 73 y.o. 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 She had normal coronary arteries by catheterization in 2010, history of rheumatoid arthritis, breast cancer, status post mastectomy and chemotherapy in 1997  with ejection fraction noted to be 30% in 2005.  She had an episode of heart failure in 2010 which was likely triggered by the use of Enbrel for rheumatoid arthritis.  Her initial ejection fraction was 25-30% which gradually improved to 50-55% .  She had a Lexiscan Myoview in October 2021 which showed an EF of 45 to 54% with no ischemia.    She did not tolerate SGLT2 inhibitors due to urinary tract infection and GI symptoms.  Echocardiogram in June 2023 showed an EF of 50 to 55% with mild to moderate mitral regurgitation.  She has been doing well with no chest pain, shortness of breath or palpitations.  She improved her dietary lifestyle significantly over the last year and lost 10 pounds.  She takes furosemide once daily now on most days and does not have to take the afternoon dose very frequently.  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)    precipitated by use of Enbrel  2010   Chronic systolic congestive heart failure (HCC)    Collagen vascular disease (HCC)    RA   Diverticulosis of colon (without mention of hemorrhage) April 2012   occurred after colonoscopy, [place]   Menopause    secondary to chemo for BRCA    Rheumatoid arthritis (HCC)    Rheumatoid arthritis(714.0)    on Plaquenil methotrxate Lavenia Atlas)    Past Surgical History:  Procedure Laterality Date   ANKLE FUSION     right, seondary to erosive arthritis   BREAST BIOPSY Right 1997   positive for breast ca   BREAST SURGERY Right 1997   Lumpectomy   CARDIAC CATHETERIZATION  2010   normal coronary arteries   COLONOSCOPY WITH PROPOFOL N/A 03/26/2016   Procedure: COLONOSCOPY WITH PROPOFOL;  Surgeon: Christena Deem, MD;  Location: [facility] ENDOSCOPY;  Service: Endoscopy;  Laterality: N/A;   COLONOSCOPY WITH PROPOFOL N/A 03/03/2021   Procedure: COLONOSCOPY WITH PROPOFOL;  Surgeon: Midge Minium, MD;  Location: [facility] ENDOSCOPY;  Service: Endoscopy;  Laterality: N/A;   ESOPHAGOGASTRODUODENOSCOPY N/A 03/03/2021   Procedure: ESOPHAGOGASTRODUODENOSCOPY (EGD);  Surgeon: Midge Minium, MD;  Location: [facility] ENDOSCOPY;  Service: Endoscopy;  Laterality: N/A;   FOOT SURGERY  2004   MASTECTOMY Right 1997   rheumatology histroy       Current Outpatient Medications  Medication Sig Dispense Refill   Calcium 150 MG TABS Take by mouth daily.     carvedilol (COREG) 6.25 MG tablet TAKE 1 TABLET BY MOUTH TWICE A DAY 180 tablet 0   cholecalciferol (VITAMIN D) 1000 UNITS tablet Take 2,000 Units by mouth daily.      folic acid (FOLVITE) 1 MG tablet Take 1 mg by  mouth daily.     furosemide (LASIX) 20 MG tablet Take 1 tablet (20 mg total) by mouth 2 (two) times daily. 180 tablet 2   hydroxychloroquine (PLAQUENIL) 200 MG tablet Take one and one-half tablet daily.     lisinopril (ZESTRIL) 10 MG tablet TAKE 1 TABLET BY MOUTH EVERY DAY 90 tablet 1   methotrexate 50 MG/2ML injection Inject 20 mg into the skin once a week.     pantoprazole (PROTONIX) 40 MG tablet TAKE 1 TABLET BY MOUTH EVERY DAY 90 tablet 0   spironolactone (ALDACTONE) 25 MG tablet TAKE 1 TABLET BY MOUTH EVERY DAY 90 tablet 0   valACYclovir (VALTREX) 500 MG tablet Take 500 mg by mouth daily.      methotrexate (RHEUMATREX) 2.5 MG tablet Take 20 mg by mouth once a week. (Patient not taking: Reported on 09/27/2023)     ondansetron (ZOFRAN-ODT) 4 MG disintegrating tablet Take 1 tablet (4 mg total) by mouth every 8 (eight) hours as needed. (Patient not taking: Reported on 09/27/2023) 20 tablet 0   oxyCODONE (ROXICODONE) 5 MG immediate release tablet Take 1 tablet (5 mg total) by mouth every 8 (eight) hours as needed. (Patient not taking: Reported on 09/27/2023) 13 tablet 0   No current facility-administered medications for this visit.    Allergies:   Amoxicillin, Sulfasalazine, and Latex    Social History:  The patient  reports that she has never smoked. She has never used smokeless tobacco. She reports current alcohol use. She reports that she does not use drugs.   Family History:  The patient's family history includes Breast cancer in her paternal aunt; Cancer in her mother and paternal aunt; Heart disease in her father and maternal grandmother.    ROS:  Please see the history of present illness.   Otherwise, review of systems are positive for none.   All other systems are reviewed and negative.    PHYSICAL EXAM: VS:  BP 104/60 (BP Location: Left Arm, Patient Position: Sitting, Cuff Size: Large)   Pulse 91   Ht 5\' 2"  (1.575 m)   Wt 134 lb 3.2 oz (60.9 kg)   SpO2 98%   BMI 24.55 kg/m  , BMI Body mass index is 24.55 kg/m. GEN: Well nourished, well developed, in no acute distress  HEENT: normal  Neck: no JVD, carotid bruits, or masses Cardiac: RRR; no murmurs, rubs, or gallops, no leg edema Respiratory:  clear to auscultation bilaterally, normal work of breathing GI: soft, nontender, nondistended, + BS MS: no deformity or atrophy  Skin: warm and dry, no rash Neuro:  Strength and sensation are intact Psych: euthymic mood, full affect   EKG:  EKG is ordered today. The ekg ordered today demonstrates : Sinus rhythm with sinus arrhythmia with occasional Premature ventricular  complexes Low voltage QRS When compared with ECG of 19-Jan-2013 09:09, Premature ventricular complexes are now Present   Recent Labs: 12/18/2022: ALT 16; BUN 18; Creatinine, Ser 0.69; Hemoglobin 11.7; Platelets 188; Potassium 4.3; Sodium 134    Lipid Panel    Component Value Date/Time   CHOL 165 11/02/2016 1520   TRIG 110.0 11/02/2016 1520   TRIG 82 02/04/2010 0000   HDL 55.80 11/02/2016 1520   CHOLHDL 3 11/02/2016 1520   VLDL 22.0 11/02/2016 1520   LDLCALC 87 11/02/2016 1520   LDLDIRECT 87.0 11/02/2016 1520      Wt Readings from Last 3 Encounters:  09/27/23 134 lb 3.2 oz (60.9 kg)  12/18/22 140 lb (63.5 kg)  07/15/22  142 lb 2 oz (64.5 kg)        ASSESSMENT AND PLAN:  1.  Chronic systolic heart failure: Due to chemotherapy-induced cardiomyopathy. Most recent ejection fraction was 50-55 %.  No plans to switch to [facility] given chronically low blood pressure.   Continue carvedilol, lisinopril and spironolactone.  Jardiance was discontinued due to UTIs and abdominal pain.  She is euvolemic on furosemide 20 mg once daily. I reviewed most recent labs which showed normal renal function and electrolytes.  2. Rheumatoid arthritis: She is currently on Plaquenil and methotrexate.  3.  mitral regurgitation: This was mild to moderate on most recent echocardiogram in June 2023.  No murmurs by exam today and will plan on repeat echocardiogram in 1 year.   Disposition:   FU with me in 12 months.  Signed,  Lorine Bears, MD  09/27/2023 1:49 PM    [facility]

## 2023-10-06 ENCOUNTER — Other Ambulatory Visit: Payer: Self-pay | Admitting: Cardiovascular Disease

## 2024-04-11 ENCOUNTER — Other Ambulatory Visit: Payer: Self-pay | Admitting: Internal Medicine

## 2024-04-11 DIAGNOSIS — Z1231 Encounter for screening mammogram for malignant neoplasm of breast: Secondary | ICD-10-CM

## 2024-05-28 ENCOUNTER — Ambulatory Visit
Admission: RE | Admit: 2024-05-28 | Discharge: 2024-05-28 | Disposition: A | Discharge status: Home / Self Care | Source: Ambulatory Visit | Attending: Internal Medicine | Admitting: Internal Medicine

## 2024-05-28 DIAGNOSIS — Z1231 Encounter for screening mammogram for malignant neoplasm of breast: Secondary | ICD-10-CM | POA: Diagnosis present

## 2024-09-26 ENCOUNTER — Encounter: Payer: Self-pay | Admitting: Cardiovascular Disease

## 2024-09-26 ENCOUNTER — Ambulatory Visit: Attending: Cardiovascular Disease | Admitting: Cardiovascular Disease

## 2024-09-26 VITALS — BP 98/60 | HR 82 | Ht 61.5 in | Wt 136.4 lb

## 2024-09-26 DIAGNOSIS — I872 Venous insufficiency (chronic) (peripheral): Secondary | ICD-10-CM | POA: Insufficient documentation

## 2024-09-26 DIAGNOSIS — I340 Nonrheumatic mitral (valve) insufficiency: Secondary | ICD-10-CM | POA: Diagnosis not present

## 2024-09-26 DIAGNOSIS — I5022 Chronic systolic (congestive) heart failure: Secondary | ICD-10-CM | POA: Diagnosis not present

## 2024-09-26 NOTE — Patient Instructions (Signed)
 Medication Instructions:  No changes *If you need a refill on your cardiac medications before your next appointment, please call your pharmacy*  Lab Work None ordered If you have labs (blood work) drawn today and your tests are completely normal, you will receive your results only by: MyChart Message (if you have MyChart) OR A paper copy in the mail If you have any lab test that is abnormal or we need to change your treatment, we will call you to review the results.  Testing/Procedures: Your physician has requested that you have an echocardiogram. Echocardiography is a painless test that uses sound waves to create images of your heart. It provides your doctor with information about the size and shape of your heart and how well your heart's chambers and valves are working.   You may receive an ultrasound enhancing agent through an IV if needed to better visualize your heart during the echo. This procedure takes approximately one hour.  There are no restrictions for this procedure.  This will take place at [address] [facility] [address] [facility] [address], [place] [address]  Please note: We ask at that you not bring children with you during ultrasound (echo/ vascular) testing. Due to room size and safety concerns, children are not allowed in the ultrasound rooms during exams. Our front office staff cannot provide observation of children in our lobby area while testing is being conducted. An adult accompanying a patient to their appointment will only be allowed in the ultrasound room at the discretion of the ultrasound technician under special circumstances. We apologize for any inconvenience.   Follow-Up: At [facility], you and your health needs are our priority.  As part of our continuing mission to provide you with exceptional heart care, our providers are all part of one team.  This team includes your primary Cardiologist (physician) and Advanced Practice Providers or APPs  (Physician Assistants and Nurse Practitioners) who all work together to provide you with the care you need, when you need it.  Your next appointment:   12 month(s)  Provider:   You may see Antionette Kirks, MD or one of the following Advanced Practice Providers on your designated Care Team:   Laneta Pintos, NP Gildardo Labrador, PA-C Varney Gentleman, PA-C Cadence Hampstead, PA-C Ronald Cockayne, NP Morey Ar, NP    We recommend signing up for the patient portal called "MyChart".  Sign up information is provided on this After Visit Summary.  MyChart is used to connect with patients for Virtual Visits (Telemedicine).  Patients are able to view lab/test results, encounter notes, upcoming appointments, etc.  Non-urgent messages can be sent to your provider as well.   To learn more about what you can do with MyChart, go to [email or web address].

## 2024-09-26 NOTE — Progress Notes (Signed)
 "    Cardiology Office Note   Date:  09/26/2024   ID:  Helen Parks, Helen Parks 26-Jul-1951, MRN [id number]  PCP:  Auston Reyes BIRCH, MD  Cardiologist:   Deatrice Cage, MD   Chief Complaint  Patient presents with   Follow-up    12 Month f/u c/o chest pressure at bedtime, right leg/knee numbness/burning& tingling sensation. Meds reviewed verbally with pt.      History of Present Illness: Helen Parks is a 74 y.o. 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 She had normal coronary arteries by catheterization in 2010, history of rheumatoid arthritis, breast cancer, status post mastectomy and chemotherapy in 1997 with ejection fraction noted to be 30% in 2005.  She had an episode of heart failure in 2010 which was likely triggered by the use of Enbrel for rheumatoid arthritis.  Her initial ejection fraction was 25-30% which gradually improved to 50-55% .  She had a Lexiscan  Myoview  in October 2021 which showed an EF of 45 to 54% with no ischemia.    She did not tolerate SGLT2 inhibitors due to urinary tract infection and GI symptoms.  Echocardiogram in June 2023 showed an EF of 50 to 55% with mild to moderate mitral regurgitation.  She reports worsening right leg edema.  No chest pain or worsening dyspnea.  Her weight has been stable.  Rheumatoid arthritis symptoms seem to be worsening.  Past Medical History:  Diagnosis Date   Breast cancer (HCC) 1997   s/p mastectomy, right breast, chemo, BRCA negative   Cardiomyopathy    Cardiomyopathy due to chemotherapy    precipitated by use of Enbrel  2010   Chronic systolic congestive heart failure (HCC)    Collagen vascular disease    RA   Diverticulosis of colon (without mention of hemorrhage) April 2012   occurred after colonoscopy, [place]   Menopause    secondary to chemo for BRCA   Rheumatoid arthritis (HCC)    Rheumatoid arthritis(714.0)    on  Plaquenil methotrxate ([facility])    Past Surgical History:  Procedure Laterality Date   ANKLE FUSION     right, seondary to erosive arthritis   BREAST BIOPSY Right 1997   positive for breast ca   BREAST SURGERY Right 1997   Lumpectomy   CARDIAC CATHETERIZATION  2010   normal coronary arteries   COLONOSCOPY WITH PROPOFOL  N/A 03/26/2016   Procedure: COLONOSCOPY WITH PROPOFOL ;  Surgeon: Gladis RAYMOND Mariner, MD;  Location: [facility] ENDOSCOPY;  Service: Endoscopy;  Laterality: N/A;   COLONOSCOPY WITH PROPOFOL  N/A 03/03/2021   Procedure: COLONOSCOPY WITH PROPOFOL ;  Surgeon: Jinny Carmine, MD;  Location: [facility] ENDOSCOPY;  Service: Endoscopy;  Laterality: N/A;   ESOPHAGOGASTRODUODENOSCOPY N/A 03/03/2021   Procedure: ESOPHAGOGASTRODUODENOSCOPY (EGD);  Surgeon: Jinny Carmine, MD;  Location: [facility] ENDOSCOPY;  Service: Endoscopy;  Laterality: N/A;   FOOT SURGERY  2004   MASTECTOMY Right 1997   rheumatology histroy       Current Outpatient Medications  Medication Sig Dispense Refill   Calcium 150 MG TABS Take by mouth daily.     carvedilol  (COREG ) 6.25 MG tablet TAKE 1 TABLET BY MOUTH TWICE A DAY 180 tablet 0   cholecalciferol (VITAMIN D ) 1000 UNITS tablet Take 2,000 Units by mouth daily.      folic acid (FOLVITE) 1 MG tablet Take 1 mg by mouth daily.     furosemide  (LASIX ) 20 MG tablet TAKE 1 TABLET BY MOUTH  TWICE A DAY 180 tablet 3   hydroxychloroquine (PLAQUENIL) 200 MG tablet Take one and one-half tablet daily.     lisinopril  (ZESTRIL ) 10 MG tablet TAKE 1 TABLET BY MOUTH EVERY DAY 90 tablet 1   methotrexate 50 MG/2ML injection Inject 25 mg into the skin.     ondansetron  (ZOFRAN -ODT) 4 MG disintegrating tablet Take 1 tablet (4 mg total) by mouth every 8 (eight) hours as needed. 20 tablet 0   pantoprazole  (PROTONIX ) 40 MG tablet TAKE 1 TABLET BY MOUTH EVERY DAY 90 tablet 0   spironolactone  (ALDACTONE ) 25 MG tablet TAKE 1 TABLET BY MOUTH EVERY DAY 90 tablet 0   valACYclovir (VALTREX) 500 MG tablet  Take 500 mg by mouth daily.     methotrexate (RHEUMATREX) 2.5 MG tablet Take 20 mg by mouth once a week. (Patient not taking: Reported on 09/26/2024)     methotrexate 50 MG/2ML injection Inject 20 mg into the skin once a week. (Patient not taking: Reported on 09/26/2024)     No current facility-administered medications for this visit.    Allergies:   Amoxicillin , Sulfasalazine, and Latex    Social History:  The patient  reports that she has never smoked. She has never used smokeless tobacco. She reports current alcohol use. She reports that she does not use drugs.   Family History:  The patient's family history includes Breast cancer in her paternal aunt; Cancer in her mother and paternal aunt; Heart disease in her father and maternal grandmother.    ROS:  Please see the history of present illness.   Otherwise, review of systems are positive for none.   All other systems are reviewed and negative.    PHYSICAL EXAM: VS:  BP 98/60 (BP Location: Left Arm, Patient Position: Sitting, Cuff Size: Normal)   Pulse 82   Ht 5' 1.5 (1.562 m)   Wt 136 lb 6 oz (61.9 kg)   SpO2 98%   BMI 25.35 kg/m  , BMI Body mass index is 25.35 kg/m. GEN: Well nourished, well developed, in no acute distress  HEENT: normal  Neck: no JVD, carotid bruits, or masses Cardiac: RRR; no murmurs, rubs, or gallops, no leg edema Respiratory:  clear to auscultation bilaterally, normal work of breathing GI: soft, nontender, nondistended, + BS MS: no deformity or atrophy  Skin: warm and dry, no rash Neuro:  Strength and sensation are intact Psych: euthymic mood, full affect   EKG:  EKG is ordered today. The ekg ordered today demonstrates : Normal sinus rhythm Low voltage QRS When compared with ECG of 27-Sep-2023 13:45, Premature ventricular complexes are no longer Present   Recent Labs: No results found for requested labs within last 365 days.    Lipid Panel    Component Value Date/Time   CHOL 165 11/02/2016  1520   TRIG 110.0 11/02/2016 1520   TRIG 82 02/04/2010 0000   HDL 55.80 11/02/2016 1520   CHOLHDL 3 11/02/2016 1520   VLDL 22.0 11/02/2016 1520   LDLCALC 87 11/02/2016 1520   LDLDIRECT 87.0 11/02/2016 1520      Wt Readings from Last 3 Encounters:  09/26/24 136 lb 6 oz (61.9 kg)  09/27/23 134 lb 3.2 oz (60.9 kg)  12/18/22 140 lb (63.5 kg)        ASSESSMENT AND PLAN:  1.  Chronic systolic heart failure: Due to chemotherapy-induced cardiomyopathy. Most recent ejection fraction was 50-55 %.  No plans to switch to Entresto given chronically low blood pressure.   Continue carvedilol ,  lisinopril  and spironolactone .  Jardiance  was discontinued due to UTIs and abdominal pain.   She appears to be euvolemic.  Increased right leg edema is likely due to chronic venous insufficiency.  2. Rheumatoid arthritis: She is currently on Plaquenil and methotrexate.  3.  mitral regurgitation: This was mild to moderate on most recent echocardiogram in June 2023.  No murmurs by exam today .  She is due for a follow-up echocardiogram which was requested today.  4.  Chronic venous insufficiency: I advised her to start using knee-high support stockings during the day.   Disposition:   FU with me in 12 months.  Signed,  Deatrice Cage, MD  09/26/2024 10:40 AM    [facility] "

## 2024-10-05 ENCOUNTER — Ambulatory Visit

## 2024-10-05 DIAGNOSIS — I5022 Chronic systolic (congestive) heart failure: Secondary | ICD-10-CM

## 2024-10-05 LAB — ECHOCARDIOGRAM COMPLETE
AR max vel: 2.9 cm2
AV Area VTI: 2.81 cm2
AV Area mean vel: 2.81 cm2
AV Mean grad: 3 mmHg
AV Peak grad: 5.3 mmHg
Ao pk vel: 1.15 m/s
Area-P 1/2: 3.6 cm2
Calc EF: 42.2 %
S' Lateral: 4.1 cm
Single Plane A2C EF: 41.1 %
Single Plane A4C EF: 43.1 %
